# Patient Record
Sex: Female | Born: 1950 | Race: White | Hispanic: No | Marital: Married | Smoking: Former smoker
Health system: Southern US, Community
[De-identification: ages and names within clinical notes are randomized; demographics above are authoritative.]

## PROBLEM LIST (undated history)

## (undated) DIAGNOSIS — M1990 Unspecified osteoarthritis, unspecified site: Secondary | ICD-10-CM

## (undated) DIAGNOSIS — Z8489 Family history of other specified conditions: Secondary | ICD-10-CM

## (undated) DIAGNOSIS — G4730 Sleep apnea, unspecified: Secondary | ICD-10-CM

## (undated) DIAGNOSIS — H409 Unspecified glaucoma: Secondary | ICD-10-CM

## (undated) DIAGNOSIS — M797 Fibromyalgia: Secondary | ICD-10-CM

## (undated) DIAGNOSIS — E210 Primary hyperparathyroidism: Secondary | ICD-10-CM

## (undated) DIAGNOSIS — D649 Anemia, unspecified: Secondary | ICD-10-CM

## (undated) DIAGNOSIS — M419 Scoliosis, unspecified: Secondary | ICD-10-CM

## (undated) DIAGNOSIS — F419 Anxiety disorder, unspecified: Secondary | ICD-10-CM

## (undated) DIAGNOSIS — G250 Essential tremor: Secondary | ICD-10-CM

## (undated) DIAGNOSIS — E669 Obesity, unspecified: Secondary | ICD-10-CM

## (undated) DIAGNOSIS — K219 Gastro-esophageal reflux disease without esophagitis: Secondary | ICD-10-CM

## (undated) DIAGNOSIS — K589 Irritable bowel syndrome without diarrhea: Secondary | ICD-10-CM

## (undated) DIAGNOSIS — F329 Major depressive disorder, single episode, unspecified: Secondary | ICD-10-CM

## (undated) DIAGNOSIS — I10 Essential (primary) hypertension: Secondary | ICD-10-CM

## (undated) DIAGNOSIS — G9332 Myalgic encephalomyelitis/chronic fatigue syndrome: Secondary | ICD-10-CM

## (undated) DIAGNOSIS — G90A Postural orthostatic tachycardia syndrome (POTS): Secondary | ICD-10-CM

## (undated) DIAGNOSIS — F32A Depression, unspecified: Secondary | ICD-10-CM

## (undated) DIAGNOSIS — E785 Hyperlipidemia, unspecified: Secondary | ICD-10-CM

## (undated) HISTORY — DX: Obesity, unspecified: E66.9

## (undated) HISTORY — DX: Gastro-esophageal reflux disease without esophagitis: K21.9

## (undated) HISTORY — PX: DILATION AND CURETTAGE OF UTERUS: SHX78

## (undated) HISTORY — DX: Anemia, unspecified: D64.9

## (undated) HISTORY — DX: Fibromyalgia: M79.7

## (undated) HISTORY — DX: Irritable bowel syndrome, unspecified: K58.9

## (undated) HISTORY — DX: Sleep apnea, unspecified: G47.30

## (undated) HISTORY — DX: Hyperlipidemia, unspecified: E78.5

## (undated) HISTORY — DX: Essential tremor: G25.0

## (undated) HISTORY — DX: Anxiety disorder, unspecified: F41.9

## (undated) HISTORY — PX: APPENDECTOMY: SHX54

## (undated) HISTORY — DX: Myalgic encephalomyelitis/chronic fatigue syndrome: G93.32

## (undated) HISTORY — DX: Major depressive disorder, single episode, unspecified: F32.9

## (undated) HISTORY — DX: Scoliosis, unspecified: M41.9

## (undated) HISTORY — DX: Essential (primary) hypertension: I10

## (undated) HISTORY — DX: Depression, unspecified: F32.A

---
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
## 2010-12-17 ENCOUNTER — Emergency Department (HOSPITAL_COMMUNITY)
Admission: EM | Admit: 2010-12-17 | Discharge: 2010-12-18 | Disposition: A | Discharge status: Home / Self Care | Payer: No Typology Code available for payment source | Attending: Emergency Medicine | Admitting: Emergency Medicine

## 2010-12-17 DIAGNOSIS — H3320 Serous retinal detachment, unspecified eye: Secondary | ICD-10-CM | POA: Insufficient documentation

## 2010-12-17 DIAGNOSIS — Z79899 Other long term (current) drug therapy: Secondary | ICD-10-CM | POA: Insufficient documentation

## 2010-12-17 DIAGNOSIS — H539 Unspecified visual disturbance: Secondary | ICD-10-CM | POA: Insufficient documentation

## 2011-05-11 ENCOUNTER — Encounter: Payer: Self-pay | Admitting: Family Medicine

## 2011-05-11 DIAGNOSIS — I10 Essential (primary) hypertension: Secondary | ICD-10-CM | POA: Insufficient documentation

## 2013-02-15 ENCOUNTER — Ambulatory Visit (INDEPENDENT_AMBULATORY_CARE_PROVIDER_SITE_OTHER): Payer: No Typology Code available for payment source | Admitting: Family Medicine

## 2013-02-15 ENCOUNTER — Encounter: Payer: Self-pay | Admitting: Family Medicine

## 2013-02-15 VITALS — BP 124/78 | HR 70 | Temp 98.1°F | Resp 16 | Wt 249.0 lb

## 2013-02-15 DIAGNOSIS — I10 Essential (primary) hypertension: Secondary | ICD-10-CM

## 2013-02-15 DIAGNOSIS — R002 Palpitations: Secondary | ICD-10-CM

## 2013-02-15 DIAGNOSIS — F329 Major depressive disorder, single episode, unspecified: Secondary | ICD-10-CM

## 2013-02-15 MED ORDER — VENLAFAXINE HCL ER 75 MG PO CP24: 150.0000 mg | ORAL_CAPSULE | Freq: Every day | ORAL | Status: DC

## 2013-02-15 NOTE — Progress Notes (Signed)
  Subjective:    Patient ID: Melanie Hurst, female    DOB: 03/18/1951, 62 y.o.   MRN: [id number]  HPI  Patient history of depression and hypertension. She's currently on Wellbutrin SR 150 mg by mouth daily. She's not taking this twice a day. Furthermore she is taking Zoloft 50 mg by mouth daily. She reports worsening depression. She reports anhedonia. She reports fear of leaving the house and other symptoms of phobia. She denied suicidal ideation. She reports some insomnia. She reports decreased energy. She reports general malaise and apathy.  She also reports occasional fluttering palpitations in her chest. These will occur and last sometimes up to an hour. She denies any syncope Past Medical History  Diagnosis Date  . Hypertension   . Depression    Current Outpatient Prescriptions on File Prior to Visit  Medication Sig Dispense Refill  . buPROPion (WELLBUTRIN SR) 150 MG 12 hr tablet Take 150 mg by mouth 2 (two) times daily.        . sertraline (ZOLOFT) 50 MG tablet Take 50 mg by mouth daily.         No current facility-administered medications on file prior to visit.   No Known Allergies   Review of Systems Remainder of review of systems negative    Objective:   Physical Exam  Neck: Neck supple. No JVD present. No thyromegaly present.  Cardiovascular: Normal rate, regular rhythm and normal heart sounds.   Pulmonary/Chest: Effort normal and breath sounds normal. No respiratory distress. She has no wheezes. She has no rales.  Lymphadenopathy:    She has no cervical adenopathy.          Assessment & Plan:  1. Hypertension Stable. No medication needed at this time.  2. Depression The Zoloft and replaced with Effexor. Begin Effexor XR 75 mg by mouth daily. In 2 weeks increase to 50 mg by mouth daily. Recheck in one month. - venlafaxine XR (EFFEXOR XR) 75 MG 24 hr capsule; Take 2 capsules (150 mg total) by mouth daily.  Dispense: 60 capsule; Refill: 5  3.  Palpitations Schedule an event monitor and get TSH if persistent. - Cardiac event monitor; Future

## 2013-02-27 ENCOUNTER — Encounter: Payer: Self-pay | Admitting: *Deleted

## 2013-02-27 ENCOUNTER — Encounter (INDEPENDENT_AMBULATORY_CARE_PROVIDER_SITE_OTHER): Payer: No Typology Code available for payment source

## 2013-02-27 DIAGNOSIS — R002 Palpitations: Secondary | ICD-10-CM

## 2013-02-27 NOTE — Progress Notes (Signed)
Patient ID: Melanie Hurst, female   DOB: 1950-10-14, 62 y.o.   MRN: [id number] E-cardio 30 day monitor placed on patient.

## 2013-03-15 ENCOUNTER — Encounter: Payer: Self-pay | Admitting: Cardiovascular Disease

## 2013-03-15 ENCOUNTER — Ambulatory Visit (INDEPENDENT_AMBULATORY_CARE_PROVIDER_SITE_OTHER): Payer: No Typology Code available for payment source | Admitting: Cardiovascular Disease

## 2013-03-15 VITALS — BP 138/98 | HR 67 | Ht 70.5 in | Wt 247.0 lb

## 2013-03-15 DIAGNOSIS — I10 Essential (primary) hypertension: Secondary | ICD-10-CM

## 2013-03-15 DIAGNOSIS — F329 Major depressive disorder, single episode, unspecified: Secondary | ICD-10-CM

## 2013-03-15 DIAGNOSIS — R0609 Other forms of dyspnea: Secondary | ICD-10-CM

## 2013-03-15 DIAGNOSIS — F32A Depression, unspecified: Secondary | ICD-10-CM

## 2013-03-15 DIAGNOSIS — R0989 Other specified symptoms and signs involving the circulatory and respiratory systems: Secondary | ICD-10-CM

## 2013-03-15 DIAGNOSIS — R0600 Dyspnea, unspecified: Secondary | ICD-10-CM

## 2013-03-15 NOTE — Progress Notes (Signed)
     Melanie Hurst Date of Birth  April 27, 1951       [facility]    [place] [address], [address]  [address], [address] [place], [place]  [address]   [place], [place]  [address] [phone]     [phone]   Fax  [phone]    Fax [phone]  Problem List: 1. Weakness 2. palpitations  History of Present Illness:  Melanie Hurst is a 62 yo who presents today as a self referral for exertional dyspnea and fatigue.  She denies any chest pain.   She does have palpitations c/w PVCs.    She does not get any regular exercise.  She states that she really does not want to get out and exercise.  She knows  that some of this is due to depression.   She does not work.  She makes no effort to avoid salt.    Current Outpatient Prescriptions on File Prior to Visit  Medication Sig Dispense Refill  . buPROPion (WELLBUTRIN SR) 150 MG 12 hr tablet Take 150 mg by mouth 2 (two) times daily.        . sertraline (ZOLOFT) 50 MG tablet Take 50 mg by mouth daily.        Marland Kitchen venlafaxine XR (EFFEXOR XR) 75 MG 24 hr capsule Take 2 capsules (150 mg total) by mouth daily.  60 capsule  5   No current facility-administered medications on file prior to visit.    Allergies  Allergen Reactions  . Codeine     Vomit     Past Medical History  Diagnosis Date  . Hypertension   . Depression   . IBS (irritable bowel syndrome)   . Anemia   . Sleep apnea   . Scoliosis     Past Surgical History  Procedure Laterality Date  . Tonsilectomy, adenoidectomy, bilateral myringotomy and tubes  1962    History  Smoking status  . Former Smoker  Smokeless tobacco  . Not on file    History  Alcohol Use No    History reviewed. No pertinent family history.  Reviw of Systems:  Reviewed in the HPI.  All other systems are negative.  Physical Exam: Blood pressure 138/98, pulse 67, height 5' 10.5" (1.791 m), weight 247 lb (112.038 kg). General: Well developed, well nourished, in no acute  distress.  Head: Normocephalic, atraumatic, sclera non-icteric, mucus membranes are moist,   Neck: Supple. Carotids are 2 + without bruits. No JVD   Lungs: Clear   Heart: RR, normal S1, S2  Abdomen: Soft, non-tender, non-distended with normal bowel sounds.  Msk:  Strength and tone are normal   Extremities: No clubbing or cyanosis. No edema.  Distal pedal pulses are 2+ and equal    Neuro: CN II - XII intact.  Alert and oriented X 3.   Psych:  Normal   ECG:  O2 sats are 97-98 at rest and with ambulation.  Assessment / Plan:

## 2013-03-15 NOTE — Progress Notes (Signed)
Resting spo2 98% and was 97% during ambulation .

## 2013-03-15 NOTE — Patient Instructions (Addendum)
   Your physician recommends that you schedule a follow-up appointment in: as needed basis/ you may call me back/ Jodette RN and I can place echo order to see heart structure and valves if shortness of breath dont get better with exercise  Your physician discussed the importance of regular exercise and recommended that you start or continue a regular exercise program for good health. Exercise to Stay Healthy Exercise helps you become and stay healthy. EXERCISE IDEAS AND TIPS Choose exercises that:  You enjoy.  Fit into your day. You do not need to exercise really hard to be healthy. You can do exercises at a slow or medium level and stay healthy. You can:  Stretch before and after working out.  Try yoga, Pilates, or tai chi.  Lift weights.  Walk fast, swim, jog, run, climb stairs, bicycle, dance, or rollerskate.  Take aerobic classes. Exercises that burn about 150 calories:  Running 1  miles in 15 minutes.  Playing volleyball for 45 to 60 minutes.  Washing and waxing a car for 45 to 60 minutes.  Playing touch football for 45 minutes.  Walking 1  miles in 35 minutes.  Pushing a stroller 1  miles in 30 minutes.  Playing basketball for 30 minutes.  Raking leaves for 30 minutes.  Bicycling 5 miles in 30 minutes.  Walking 2 miles in 30 minutes.  Dancing for 30 minutes.  Shoveling snow for 15 minutes.  Swimming laps for 20 minutes.  Walking up stairs for 15 minutes.  Bicycling 4 miles in 15 minutes.  Gardening for 30 to 45 minutes.  Jumping rope for 15 minutes.  Washing windows or floors for 45 to 60 minutes. Document Released: 10/30/2010 Document Revised: 12/20/2011 Document Reviewed: 10/30/2010 [facility] Patient Information 2014 [place], [place].

## 2013-03-19 ENCOUNTER — Telehealth: Payer: Self-pay | Admitting: *Deleted

## 2013-03-19 DIAGNOSIS — R0600 Dyspnea, unspecified: Secondary | ICD-10-CM | POA: Insufficient documentation

## 2013-03-19 MED ORDER — PROPRANOLOL HCL 10 MG PO TABS: 10.0000 mg | ORAL_TABLET | Freq: Four times a day (QID) | ORAL | Status: DC | PRN

## 2013-03-19 NOTE — Assessment & Plan Note (Signed)
Melanie Hurst presents with episodes of dyspnea. We ambulated her on the office and did not find any decrease in her oxygen saturation. At this point I suspect a lot of this is due to deconditioning. We'll have her start a regular exercise program. I'll see her back as needed.

## 2013-03-19 NOTE — Telephone Encounter (Signed)
Ecardio/ svt, 171bpm Explained valsalva maneuver, hard cough to try to break rhythm  Propranolol was explained/ order sent Return ov made, Pt states she "lives" on caffeine, she was asked to reduce. Pt agreed to plan and verbalized understanding

## 2013-04-04 ENCOUNTER — Telehealth: Payer: Self-pay | Admitting: *Deleted

## 2013-04-04 NOTE — Telephone Encounter (Signed)
Unable to reach/ number disconnected. Pt has f/u app, will take ecardio results to be scanned, one episode of svt @ 171 bpm 03/19/13 the rest was sinus tach.

## 2013-04-24 ENCOUNTER — Ambulatory Visit (INDEPENDENT_AMBULATORY_CARE_PROVIDER_SITE_OTHER): Payer: No Typology Code available for payment source | Admitting: Family Medicine

## 2013-04-24 ENCOUNTER — Encounter: Payer: Self-pay | Admitting: Family Medicine

## 2013-04-24 VITALS — BP 120/84 | HR 78 | Temp 98.4°F | Resp 18 | Wt 241.0 lb

## 2013-04-24 DIAGNOSIS — L819 Disorder of pigmentation, unspecified: Secondary | ICD-10-CM

## 2013-04-24 DIAGNOSIS — L810 Postinflammatory hyperpigmentation: Secondary | ICD-10-CM

## 2013-04-24 NOTE — Progress Notes (Signed)
  Subjective:    Patient ID: Melanie Hurst, female    DOB: 16-Jan-1951, 62 y.o.   MRN: [id number]  HPI Patient was recently out of the country on vacation. She was walking in sandals. She developed blisters on the dorsum of her right foot over the first MTP joint and over the dorsum of the midfoot. The blisters have now healed but they have left the purplish discoloration in those areas.  There is a not withdrawn, they're not erythematous, they're not swollen, and there not painful. However the patient was concerned they may be infected and she would like evaluated. Otherwise she is doing well. Past Medical History  Diagnosis Date  . Hypertension   . Depression   . IBS (irritable bowel syndrome)   . Anemia   . Sleep apnea   . Scoliosis    Current Outpatient Prescriptions on File Prior to Visit  Medication Sig Dispense Refill  . propranolol (INDERAL) 10 MG tablet Take 1 tablet (10 mg total) by mouth 4 (four) times daily as needed. For palpitations , take 30 minutes apart up to 4 doses.  30 tablet  5  . venlafaxine XR (EFFEXOR XR) 75 MG 24 hr capsule Take 2 capsules (150 mg total) by mouth daily.  60 capsule  5   No current facility-administered medications on file prior to visit.   Allergies  Allergen Reactions  . Codeine     Vomit    Past Surgical History  Procedure Laterality Date  . Tonsilectomy, adenoidectomy, bilateral myringotomy and tubes  1962   History   Social History  . Marital Status: Single    Spouse Name: N/A    Number of Children: N/A  . Years of Education: N/A   Occupational History  . Not on file.   Social History Main Topics  . Smoking status: Former Games developer  . Smokeless tobacco: Not on file  . Alcohol Use: No  . Drug Use: No  . Sexually Active: Not on file   Other Topics Concern  . Not on file   Social History Narrative  . No narrative on file      Review of Systems  All other systems reviewed and are negative.       Objective:   Physical  Exam  Vitals reviewed. Cardiovascular: Normal rate and regular rhythm.   Pulmonary/Chest: Effort normal and breath sounds normal.  Skin: Skin is warm. Rash noted. No erythema. No pallor.   various purplish discoloration over the dorsum of the midfoot and the dorsum of the right first MTP. There is no active cellulitis or swelling. This appears to be hyperpigmentation due to recent inflammation.        Assessment & Plan:  1. Post-inflammatory hyperpigmentation There is no evidence of cellulitis.  I reassured the patient that everything looks like it is healing well. I recommended that she wear good fitting shoes. She should give tincture of time and the discoloration should gradually improve.  Return immediately if worsening or if no better in 2 weeks.

## 2013-05-21 ENCOUNTER — Ambulatory Visit: Payer: No Typology Code available for payment source | Admitting: Cardiovascular Disease

## 2013-06-04 NOTE — Telephone Encounter (Signed)
Pt cancelled app/ left msg to call to reschedule to discuss ecardio results. Number provided

## 2013-08-16 ENCOUNTER — Other Ambulatory Visit: Payer: Self-pay

## 2013-09-26 ENCOUNTER — Other Ambulatory Visit: Payer: Self-pay | Admitting: Family Medicine

## 2014-03-19 ENCOUNTER — Other Ambulatory Visit: Payer: Self-pay | Admitting: Family Medicine

## 2014-03-20 NOTE — Telephone Encounter (Signed)
Refill appropriate and filled per protocol. 

## 2014-04-18 ENCOUNTER — Encounter: Payer: Self-pay | Admitting: Family Medicine

## 2014-04-18 ENCOUNTER — Ambulatory Visit (INDEPENDENT_AMBULATORY_CARE_PROVIDER_SITE_OTHER): Payer: No Typology Code available for payment source | Admitting: Family Medicine

## 2014-04-18 VITALS — BP 126/84 | HR 82 | Temp 98.2°F | Resp 16 | Ht 70.25 in | Wt 249.0 lb

## 2014-04-18 DIAGNOSIS — G252 Other specified forms of tremor: Secondary | ICD-10-CM

## 2014-04-18 DIAGNOSIS — G250 Essential tremor: Secondary | ICD-10-CM

## 2014-04-18 DIAGNOSIS — F329 Major depressive disorder, single episode, unspecified: Secondary | ICD-10-CM

## 2014-04-18 DIAGNOSIS — Z13220 Encounter for screening for lipoid disorders: Secondary | ICD-10-CM

## 2014-04-18 DIAGNOSIS — F3289 Other specified depressive episodes: Secondary | ICD-10-CM

## 2014-04-18 DIAGNOSIS — F32A Depression, unspecified: Secondary | ICD-10-CM

## 2014-04-18 MED ORDER — PROPRANOLOL HCL ER 60 MG PO CP24: 60.0000 mg | ORAL_CAPSULE | Freq: Every day | ORAL | Status: DC

## 2014-04-18 NOTE — Progress Notes (Signed)
   Subjective:    Patient ID: Melanie Hurst, female    DOB: 1951/01/08, 63 y.o.   MRN: [id number]  HPI Patient has a history of chronic fatigue and depression. She is currently on Effexor XR 150 mg poqday.  Depression is somewhat controlled she denies any mood swings or crying spells although she does report anhedonia.  However she has no energy.  She has no drive. She has poor concentration. She feels unmotivated. She is also dealing with an essential tremor. There is a family history of a essential tremor.  This primarily affects her left hand and her right foot. It makes it difficult to perform certain activities at times.  She refuses a Pap smear, colonoscopy, and mammogram. She is interested in the shingles vaccine which we talked about at length. Past Medical History  Diagnosis Date  . Hypertension   . Depression   . IBS (irritable bowel syndrome)   . Anemia   . Sleep apnea   . Scoliosis    Current Outpatient Prescriptions on File Prior to Visit  Medication Sig Dispense Refill  . venlafaxine XR (EFFEXOR-XR) 75 MG 24 hr capsule TAKE 2 CAPSULES (150 MG TOTAL) BY MOUTH DAILY.  60 capsule  0   No current facility-administered medications on file prior to visit.   Allergies  Allergen Reactions  . Codeine     Vomit    History   Social History  . Marital Status: Single    Spouse Name: N/A    Number of Children: N/A  . Years of Education: N/A   Occupational History  . Not on file.   Social History Main Topics  . Smoking status: Former Research scientist (life sciences)  . Smokeless tobacco: Not on file  . Alcohol Use: No  . Drug Use: No  . Sexual Activity: Not on file   Other Topics Concern  . Not on file   Social History Narrative  . No narrative on file      Review of Systems  All other systems reviewed and are negative.      Objective:   Physical Exam  Vitals reviewed. Cardiovascular: Normal rate, regular rhythm, normal heart sounds and intact distal pulses.  Exam reveals no gallop  and no friction rub.   No murmur heard. Pulmonary/Chest: Effort normal and breath sounds normal. No respiratory distress. She has no wheezes. She has no rales.  Abdominal: Soft. Bowel sounds are normal. She exhibits no distension. There is no tenderness. There is no rebound.  Musculoskeletal: She exhibits no edema.          Assessment & Plan:  1. Familial tremor I will try the patient on Inderal LA 60 mg by mouth daily. Recheck in one month. Monitor for hypotension. - propranolol ER (INDERAL LA) 60 MG 24 hr capsule; Take 1 capsule (60 mg total) by mouth daily.  Dispense: 30 capsule; Refill: 5 - CBC with Differential - COMPLETE METABOLIC PANEL WITH GFR  2. Screening cholesterol level I will check a fasting lipid panel to screen the patient for hyperlipidemia. - Lipid panel  3. Depression Decrease Effexor XR to 75 mg by mouth daily for one week and then discontinue the medication altogether. Meanwhile begin brillinta 5 mg poqday for 1 week and then increase to 10 mg poqday.  Recheck in one month. I believe the patient's fatigue and apathy  is likely due to her depression.

## 2014-04-19 ENCOUNTER — Encounter: Payer: Self-pay | Admitting: Family Medicine

## 2014-04-19 LAB — CBC WITH DIFFERENTIAL/PLATELET
BASOS ABS: 0.1 10*3/uL (ref 0.0–0.1)
Basophils Relative: 1 % (ref 0–1)
Eosinophils Absolute: 0.2 10*3/uL (ref 0.0–0.7)
Eosinophils Relative: 3 % (ref 0–5)
HEMATOCRIT: 43.8 % (ref 36.0–46.0)
Hemoglobin: 15.1 g/dL — ABNORMAL HIGH (ref 12.0–15.0)
LYMPHS PCT: 33 % (ref 12–46)
Lymphs Abs: 1.8 10*3/uL (ref 0.7–4.0)
MCH: 28.8 pg (ref 26.0–34.0)
MCHC: 34.5 g/dL (ref 30.0–36.0)
MCV: 83.6 fL (ref 78.0–100.0)
Monocytes Absolute: 0.3 10*3/uL (ref 0.1–1.0)
Monocytes Relative: 6 % (ref 3–12)
NEUTROS ABS: 3.2 10*3/uL (ref 1.7–7.7)
Neutrophils Relative %: 57 % (ref 43–77)
Platelets: 343 10*3/uL (ref 150–400)
RBC: 5.24 MIL/uL — AB (ref 3.87–5.11)
RDW: 14.2 % (ref 11.5–15.5)
WBC: 5.6 10*3/uL (ref 4.0–10.5)

## 2014-04-19 LAB — COMPLETE METABOLIC PANEL WITH GFR
ALT: 14 U/L (ref 0–35)
AST: 13 U/L (ref 0–37)
Albumin: 4.4 g/dL (ref 3.5–5.2)
Alkaline Phosphatase: 98 U/L (ref 39–117)
BUN: 13 mg/dL (ref 6–23)
CO2: 23 mEq/L (ref 19–32)
Calcium: 9.4 mg/dL (ref 8.4–10.5)
Chloride: 104 mEq/L (ref 96–112)
Creat: 0.88 mg/dL (ref 0.50–1.10)
GFR, Est African American: 81 mL/min
GFR, Est Non African American: 71 mL/min
Glucose, Bld: 93 mg/dL (ref 70–99)
POTASSIUM: 4.3 meq/L (ref 3.5–5.3)
Sodium: 138 mEq/L (ref 135–145)
Total Bilirubin: 0.7 mg/dL (ref 0.2–1.2)
Total Protein: 6.7 g/dL (ref 6.0–8.3)

## 2014-04-19 LAB — LIPID PANEL
Cholesterol: 227 mg/dL — ABNORMAL HIGH (ref 0–200)
HDL: 44 mg/dL (ref >39)
LDL Cholesterol: 154 mg/dL — ABNORMAL HIGH (ref 0–99)
Total CHOL/HDL Ratio: 5.2 Ratio
Triglycerides: 143 mg/dL (ref <150)
VLDL: 29 mg/dL (ref 0–40)

## 2014-04-30 ENCOUNTER — Telehealth: Payer: Self-pay | Admitting: Family Medicine

## 2014-04-30 MED ORDER — VENLAFAXINE HCL ER 75 MG PO CP24: ORAL_CAPSULE | ORAL | Status: DC

## 2014-04-30 NOTE — Telephone Encounter (Signed)
Pt called stating that she feels really bad on the Brintellix and wants to go back on the Effexor. She has only been on the 10mg  less then 1 week and I did encourage her to give it 2 weeks to get in her system good but she insisted that it was not helping her and she just wanted to go back on Effexor. What would you like for her to do? Can she just stop the Brintellix?

## 2014-04-30 NOTE — Telephone Encounter (Signed)
Med sent to pharm and pt aware per vm

## 2014-04-30 NOTE — Telephone Encounter (Signed)
Ok, just stop and resume effexor.

## 2014-10-02 ENCOUNTER — Other Ambulatory Visit: Payer: Self-pay | Admitting: Family Medicine

## 2014-10-07 ENCOUNTER — Encounter: Payer: Self-pay | Admitting: Family Medicine

## 2014-10-07 NOTE — Telephone Encounter (Signed)
Medication refill for one time only.  Patient needs to be seen.  Letter sent for patient to call and schedule 

## 2014-12-06 ENCOUNTER — Ambulatory Visit (INDEPENDENT_AMBULATORY_CARE_PROVIDER_SITE_OTHER): Payer: 59 | Admitting: Family Medicine

## 2014-12-06 ENCOUNTER — Encounter: Payer: Self-pay | Admitting: Family Medicine

## 2014-12-06 VITALS — BP 132/86 | HR 98 | Temp 98.3°F | Resp 18 | Ht 70.0 in | Wt 250.0 lb

## 2014-12-06 DIAGNOSIS — J019 Acute sinusitis, unspecified: Secondary | ICD-10-CM

## 2014-12-06 DIAGNOSIS — F411 Generalized anxiety disorder: Secondary | ICD-10-CM

## 2014-12-06 MED ORDER — CEFDINIR 300 MG PO CAPS: 300.0000 mg | ORAL_CAPSULE | Freq: Two times a day (BID) | ORAL | Status: DC

## 2014-12-06 MED ORDER — VENLAFAXINE HCL ER 75 MG PO CP24: ORAL_CAPSULE | ORAL | Status: DC

## 2014-12-06 MED ORDER — ALPRAZOLAM 0.5 MG PO TABS: 0.5000 mg | ORAL_TABLET | Freq: Three times a day (TID) | ORAL | Status: DC | PRN

## 2014-12-06 NOTE — Progress Notes (Signed)
   Subjective:    Patient ID: Melanie Hurst, female    DOB: 1951-07-10, 64 y.o.   MRN: [id number]  HPI Patient has a history of depression and generalized anxiety disorder. She also has social phobia that keeps her from wanting to leave the home. Fortunately Effexor 75 mg extended release, 2 tablets in the morning seems to be controlling it fairly well this year. Her depression is much better controlled. She is starting to venture away from home more often. She would like a medication she could take sparingly on an as-needed basis to help calm some of her social phobia. She also has a sinus infection. She also has a left ear infection. On examination today her left tympanic membrane is erythematous dull and retracted. She reports green purulent nasal discharge. Past Medical History  Diagnosis Date  . Hypertension   . Depression   . IBS (irritable bowel syndrome)   . Anemia   . Sleep apnea   . Scoliosis    Past Surgical History  Procedure Laterality Date  . Tonsilectomy, adenoidectomy, bilateral myringotomy and tubes  1962   Current Outpatient Prescriptions on File Prior to Visit  Medication Sig Dispense Refill  . propranolol ER (INDERAL LA) 60 MG 24 hr capsule Take 1 capsule (60 mg total) by mouth daily. 30 capsule 5   No current facility-administered medications on file prior to visit.   Allergies  Allergen Reactions  . Codeine     Vomit    History   Social History  . Marital Status: Single    Spouse Name: N/A  . Number of Children: N/A  . Years of Education: N/A   Occupational History  . Not on file.   Social History Main Topics  . Smoking status: Former Research scientist (life sciences)  . Smokeless tobacco: Not on file  . Alcohol Use: No  . Drug Use: No  . Sexual Activity: Not on file   Other Topics Concern  . Not on file   Social History Narrative      Review of Systems  All other systems reviewed and are negative.      Objective:   Physical Exam  HENT:  Right Ear: External  ear normal.  Left Ear: External ear normal. Tympanic membrane is injected and erythematous.  Mouth/Throat: Oropharynx is clear and moist.  Eyes: Conjunctivae are normal.  Cardiovascular: Normal rate, regular rhythm and normal heart sounds.   Pulmonary/Chest: Effort normal and breath sounds normal.  Abdominal: Soft. Bowel sounds are normal.  Vitals reviewed.         Assessment & Plan:  Acute rhinosinusitis - Plan: cefdinir (OMNICEF) 300 MG capsule  GAD (generalized anxiety disorder) - Plan: ALPRAZolam (XANAX) 0.5 MG tablet  Begin Omnicef 300 mg by mouth twice a day for 10 days for a sinus infection and ear infection. Continue Effexor XR 150 mg by mouth every morning for generalized anxiety disorder as well as depression. The patient can augment with Xanax 0.5 mg 1 by mouth every 8 hours when necessary anxiety. I would like these 30 tablets to last almost a year. I would like the patient to use them sparingly as needed to help treat her social phobia

## 2015-06-10 ENCOUNTER — Encounter: Payer: Self-pay | Admitting: Family Medicine

## 2015-06-10 ENCOUNTER — Ambulatory Visit (INDEPENDENT_AMBULATORY_CARE_PROVIDER_SITE_OTHER): Payer: 59 | Admitting: Family Medicine

## 2015-06-10 VITALS — BP 136/90 | HR 80 | Temp 98.8°F | Resp 16 | Ht 70.25 in | Wt 254.0 lb

## 2015-06-10 DIAGNOSIS — F329 Major depressive disorder, single episode, unspecified: Secondary | ICD-10-CM | POA: Diagnosis not present

## 2015-06-10 DIAGNOSIS — E785 Hyperlipidemia, unspecified: Secondary | ICD-10-CM

## 2015-06-10 DIAGNOSIS — F32A Depression, unspecified: Secondary | ICD-10-CM

## 2015-06-10 LAB — CBC WITH DIFFERENTIAL/PLATELET
BASOS PCT: 1 % (ref 0–1)
Basophils Absolute: 0.1 10*3/uL (ref 0.0–0.1)
Eosinophils Absolute: 0.1 10*3/uL (ref 0.0–0.7)
Eosinophils Relative: 2 % (ref 0–5)
HEMATOCRIT: 45.4 % (ref 36.0–46.0)
HEMOGLOBIN: 15.6 g/dL — AB (ref 12.0–15.0)
LYMPHS ABS: 2.3 10*3/uL (ref 0.7–4.0)
LYMPHS PCT: 35 % (ref 12–46)
MCH: 29.5 pg (ref 26.0–34.0)
MCHC: 34.4 g/dL (ref 30.0–36.0)
MCV: 86 fL (ref 78.0–100.0)
MONO ABS: 0.5 10*3/uL (ref 0.1–1.0)
MONOS PCT: 7 % (ref 3–12)
MPV: 9.1 fL (ref 8.6–12.4)
NEUTROS ABS: 3.7 10*3/uL (ref 1.7–7.7)
NEUTROS PCT: 55 % (ref 43–77)
Platelets: 305 10*3/uL (ref 150–400)
RBC: 5.28 MIL/uL — ABNORMAL HIGH (ref 3.87–5.11)
RDW: 14.3 % (ref 11.5–15.5)
WBC: 6.7 10*3/uL (ref 4.0–10.5)

## 2015-06-10 LAB — COMPLETE METABOLIC PANEL WITH GFR
ALT: 9 U/L (ref 6–29)
AST: 13 U/L (ref 10–35)
Albumin: 4.3 g/dL (ref 3.6–5.1)
Alkaline Phosphatase: 108 U/L (ref 33–130)
BUN: 8 mg/dL (ref 7–25)
CHLORIDE: 104 mmol/L (ref 98–110)
CO2: 24 mmol/L (ref 20–31)
CREATININE: 0.88 mg/dL (ref 0.50–0.99)
Calcium: 9.2 mg/dL (ref 8.6–10.4)
GFR, Est African American: 81 mL/min (ref >=60)
GFR, Est Non African American: 70 mL/min (ref >=60)
GLUCOSE: 87 mg/dL (ref 70–99)
Potassium: 4.4 mmol/L (ref 3.5–5.3)
SODIUM: 138 mmol/L (ref 135–146)
Total Bilirubin: 1 mg/dL (ref 0.2–1.2)
Total Protein: 6.6 g/dL (ref 6.1–8.1)

## 2015-06-10 LAB — LIPID PANEL
CHOL/HDL RATIO: 4.6 ratio (ref <=5.0)
CHOLESTEROL: 227 mg/dL — AB (ref 125–200)
HDL: 49 mg/dL (ref >=46)
LDL CALC: 150 mg/dL — AB (ref <130)
Triglycerides: 142 mg/dL (ref <150)
VLDL: 28 mg/dL (ref <30)

## 2015-06-10 MED ORDER — VENLAFAXINE HCL ER 75 MG PO CP24: ORAL_CAPSULE | ORAL | Status: DC

## 2015-06-10 NOTE — Progress Notes (Signed)
Subjective:    Patient ID: Melanie Hurst, female    DOB: [birth year], 64 y.o.   MRN: [id number]  HPI 04/2014 Patient has a history of chronic fatigue and depression. She is currently on Effexor XR 150 mg poqday.  Depression is somewhat controlled she denies any mood swings or crying spells although she does report anhedonia.  However she has no energy.  She has no drive. She has poor concentration. She feels unmotivated. She is also dealing with an essential tremor. There is a family history of a essential tremor.  This primarily affects her left hand and her right foot. It makes it difficult to perform certain activities at times.  She refuses a Pap smear, colonoscopy, and mammogram. She is interested in the shingles vaccine which we talked about at length.  At that time, my plan was: 1. Familial tremor I will try the patient on Inderal LA 60 mg by mouth daily. Recheck in one month. Monitor for hypotension. - propranolol ER (INDERAL LA) 60 MG 24 hr capsule; Take 1 capsule (60 mg total) by mouth daily.  Dispense: 30 capsule; Refill: 5 - CBC with Differential - COMPLETE METABOLIC PANEL WITH GFR  2. Screening cholesterol level I will check a fasting lipid panel to screen the patient for hyperlipidemia. - Lipid panel  3. Depression Decrease Effexor XR to 75 mg by mouth daily for one week and then discontinue the medication altogether. Meanwhile begin brillinta 5 mg poqday for 1 week and then increase to 10 mg poqday.  Recheck in one month. I believe the patient's fatigue and apathy  is likely due to her depression.   06/10/15  she is here today for follow-up. She would like to continue Effexor XR. She believes that medication helps control her anxiety and depression. She has made her longtime partner Melanie Hurst. Overall she seems to be in much better spirits today. She continues to refuse colonoscopies, mammograms, and Pap smears. She continues to refuse medication for hyperlipidemia.. She is  due today to recheck a fasting lipid panel. She has not been taking the propranolol for tremor. Past Medical History  Diagnosis Date  . Hypertension   . Depression   . IBS (irritable bowel syndrome)   . Anemia   . Sleep apnea   . Scoliosis    Current Outpatient Prescriptions on File Prior to Visit  Medication Sig Dispense Refill  . ALPRAZolam (XANAX) 0.5 MG tablet Take 1 tablet (0.5 mg total) by mouth 3 (three) times daily as needed for anxiety. 30 tablet 0  . propranolol ER (INDERAL LA) 60 MG 24 hr capsule Take 1 capsule (60 mg total) by mouth daily. 30 capsule 5   No current facility-administered medications on file prior to visit.   Allergies  Allergen Reactions  . Codeine     Vomit    Social History   Social History  . Marital Status: Single    Spouse Name: N/A  . Number of Children: N/A  . Years of Education: N/A   Occupational History  . Not on file.   Social History Main Topics  . Smoking status: Former Research scientist (life sciences)  . Smokeless tobacco: Not on file  . Alcohol Use: No  . Drug Use: No  . Sexual Activity: Not on file   Other Topics Concern  . Not on file   Social History Narrative      Review of Systems  All other systems reviewed and are negative.      Objective:  Physical Exam  Cardiovascular: Normal rate, regular rhythm, normal heart sounds and intact distal pulses.  Exam reveals no gallop and no friction rub.   No murmur heard. Pulmonary/Chest: Effort normal and breath sounds normal. No respiratory distress. She has no wheezes. She has no rales.  Abdominal: Soft. Bowel sounds are normal. She exhibits no distension. There is no tenderness. There is no rebound.  Musculoskeletal: She exhibits no edema.  Vitals reviewed.         Assessment & Plan:  Depression - Plan: venlafaxine XR (EFFEXOR-XR) 75 MG 24 hr capsule  HLD (hyperlipidemia) - Plan: COMPLETE METABOLIC PANEL WITH GFR, Lipid panel, CBC with Differential/Platelet   depression seems well  controlled at the present time. I will continue the Effexor XR. The patient certainly satisfied at the current dose. I am happy to hear that. I again recommended Pap smears, mammogram, and a colonoscopy but the patient refused. I recommended a flu shot this fall in October as well as the shingles vaccine. The patient will check on the price of the shingles vaccine first. Given her history of hyperlipidemia, I will check a fasting lipid panel. Blood pressures borderline. I last the patient to monitor this closely at home. Given her hyperlipidemia, if she is not willing to take a statin, I did recommend 2000 mg a day of fish oil.

## 2015-06-12 ENCOUNTER — Encounter: Payer: Self-pay | Admitting: Family Medicine

## 2016-06-05 ENCOUNTER — Other Ambulatory Visit: Payer: Self-pay | Admitting: Family Medicine

## 2016-06-05 DIAGNOSIS — F329 Major depressive disorder, single episode, unspecified: Secondary | ICD-10-CM

## 2016-06-05 DIAGNOSIS — F32A Depression, unspecified: Secondary | ICD-10-CM

## 2016-06-11 ENCOUNTER — Encounter: Payer: Self-pay | Admitting: Family Medicine

## 2016-06-11 ENCOUNTER — Ambulatory Visit (INDEPENDENT_AMBULATORY_CARE_PROVIDER_SITE_OTHER): Payer: Medicare Other | Admitting: Family Medicine

## 2016-06-11 VITALS — BP 132/88 | HR 72 | Temp 98.9°F | Resp 16 | Ht 71.0 in | Wt 272.0 lb

## 2016-06-11 DIAGNOSIS — R5382 Chronic fatigue, unspecified: Secondary | ICD-10-CM

## 2016-06-11 DIAGNOSIS — Z79899 Other long term (current) drug therapy: Secondary | ICD-10-CM | POA: Diagnosis not present

## 2016-06-11 DIAGNOSIS — Z23 Encounter for immunization: Secondary | ICD-10-CM | POA: Diagnosis not present

## 2016-06-11 DIAGNOSIS — F329 Major depressive disorder, single episode, unspecified: Secondary | ICD-10-CM

## 2016-06-11 DIAGNOSIS — F32A Depression, unspecified: Secondary | ICD-10-CM

## 2016-06-11 LAB — CBC WITH DIFFERENTIAL/PLATELET
BASOS PCT: 1 %
Basophils Absolute: 70 cells/uL (ref 0–200)
EOS ABS: 140 {cells}/uL (ref 15–500)
Eosinophils Relative: 2 %
HEMATOCRIT: 45.7 % — AB (ref 35.0–45.0)
Hemoglobin: 15 g/dL (ref 12.0–15.0)
LYMPHS ABS: 1960 {cells}/uL (ref 850–3900)
Lymphocytes Relative: 28 %
MCH: 28.7 pg (ref 27.0–33.0)
MCHC: 32.8 g/dL (ref 32.0–36.0)
MCV: 87.4 fL (ref 80.0–100.0)
MONO ABS: 420 {cells}/uL (ref 200–950)
MPV: 9.5 fL (ref 7.5–12.5)
Monocytes Relative: 6 %
NEUTROS ABS: 4410 {cells}/uL (ref 1500–7800)
Neutrophils Relative %: 63 %
Platelets: 299 10*3/uL (ref 140–400)
RBC: 5.23 MIL/uL — ABNORMAL HIGH (ref 3.80–5.10)
RDW: 14.3 % (ref 11.0–15.0)
WBC: 7 10*3/uL (ref 3.8–10.8)

## 2016-06-11 LAB — COMPLETE METABOLIC PANEL WITH GFR
ALT: 13 U/L (ref 6–29)
AST: 15 U/L (ref 10–35)
Albumin: 4.3 g/dL (ref 3.6–5.1)
Alkaline Phosphatase: 87 U/L (ref 33–130)
BILIRUBIN TOTAL: 0.6 mg/dL (ref 0.2–1.2)
BUN: 14 mg/dL (ref 7–25)
CALCIUM: 9.1 mg/dL (ref 8.6–10.4)
CO2: 26 mmol/L (ref 20–31)
CREATININE: 0.97 mg/dL (ref 0.50–0.99)
Chloride: 105 mmol/L (ref 98–110)
GFR, EST AFRICAN AMERICAN: 71 mL/min (ref >=60)
GFR, Est Non African American: 62 mL/min (ref >=60)
Glucose, Bld: 91 mg/dL (ref 70–99)
Potassium: 5 mmol/L (ref 3.5–5.3)
Sodium: 140 mmol/L (ref 135–146)
TOTAL PROTEIN: 6.5 g/dL (ref 6.1–8.1)

## 2016-06-11 MED ORDER — VENLAFAXINE HCL ER 75 MG PO CP24: ORAL_CAPSULE | ORAL | 4 refills | Status: DC

## 2016-06-11 MED ORDER — PROPRANOLOL HCL 20 MG PO TABS: 20.0000 mg | ORAL_TABLET | Freq: Three times a day (TID) | ORAL | 5 refills | Status: DC

## 2016-06-11 NOTE — Addendum Note (Signed)
Addended by: Sheral Flow on: 06/11/2016 12:45 PM   Modules accepted: Orders

## 2016-06-11 NOTE — Progress Notes (Signed)
Subjective:    Patient ID: Melanie Hurst, female    DOB: 10-Mar-1951, 65 y.o.   MRN: [id number]  HPI7/2015 Patient has a history of chronic fatigue and depression. She is currently on Effexor XR 150 mg poqday.  Depression is somewhat controlled she denies any mood swings or crying spells although she does report anhedonia.  However she has no energy.  She has no drive. She has poor concentration. She feels unmotivated. She is also dealing with an essential tremor. There is a family history of a essential tremor.  This primarily affects her left hand and her right foot. It makes it difficult to perform certain activities at times.  She refuses a Pap smear, colonoscopy, and mammogram. She is interested in the shingles vaccine which we talked about at length.  At that time, my plan was: 1. Familial tremor I will try the patient on Inderal LA 60 mg by mouth daily. Recheck in one month. Monitor for hypotension. - propranolol ER (INDERAL LA) 60 MG 24 hr capsule; Take 1 capsule (60 mg total) by mouth daily.  Dispense: 30 capsule; Refill: 5 - CBC with Differential - COMPLETE METABOLIC PANEL WITH GFR  2. Screening cholesterol level I will check a fasting lipid panel to screen the patient for hyperlipidemia. - Lipid panel  3. Depression Decrease Effexor XR to 75 mg by mouth daily for one week and then discontinue the medication altogether. Meanwhile begin brillinta 5 mg poqday for 1 week and then increase to 10 mg poqday.  Recheck in one month. I believe the patient's fatigue and apathy  is likely due to her depression.   06/10/15  she is here today for follow-up. She would like to continue Effexor XR. She believes that medication helps control her anxiety and depression. She has made her longtime partner Melanie Hurst. Overall she seems to be in much better spirits today. She continues to refuse colonoscopies, mammograms, and Pap smears. She continues to refuse medication for hyperlipidemia.. She is  due today to recheck a fasting lipid panel. She has not been taking the propranolol for tremor.  At that time, my plan was: depression seems well controlled at the present time. I will continue the Effexor XR. The patient certainly satisfied at the current dose. I am happy to hear that. I again recommended Pap smears, mammogram, and a colonoscopy but the patient refused. I recommended a flu shot this fall in October as well as the shingles vaccine. The patient will check on the price of the shingles vaccine first. Given her history of hyperlipidemia, I will check a fasting lipid panel. Blood pressures borderline. I last the patient to monitor this closely at home. Given her hyperlipidemia, if she is not willing to take a statin, I did recommend 2000 mg a day of fish oil.  06/11/16 Patient is here today for follow-up. She is requesting a year supply refills on her Effexor. She denies any problems with the medication. She is requesting to try propranolol again for essential tremor. She has a pronounced resting tremor. However it is worse with activity. She has no other signs or symptoms of Parkinson's disease. She denies any problems with ambulation. She would like to try propranolol immediate release of that she could take it on a more as-needed basis. She continues to decline a mammogram or colonoscopy. We discussed hepatitis C screening and she declined that as well. She will be willing to consent to a flu shot. I recommended that she check on the  price of the shingles vaccine. I recommended that she receive the pneumonia vaccine after she turns 51. We will defer a bone density test until next year Past Medical History:  Diagnosis Date  . Anemia   . Depression   . Hypertension   . IBS (irritable bowel syndrome)   . Scoliosis   . Sleep apnea    Current Outpatient Prescriptions on File Prior to Visit  Medication Sig Dispense Refill  . ALPRAZolam (XANAX) 0.5 MG tablet Take 1 tablet (0.5 mg total) by  mouth 3 (three) times daily as needed for anxiety. (Patient not taking: Reported on 06/11/2016) 30 tablet 0  . propranolol ER (INDERAL LA) 60 MG 24 hr capsule Take 1 capsule (60 mg total) by mouth daily. (Patient not taking: Reported on 06/11/2016) 30 capsule 5   No current facility-administered medications on file prior to visit.    Allergies  Allergen Reactions  . Codeine     Vomit    Social History   Social History  . Marital status: Single    Spouse name: N/A  . Number of children: N/A  . Years of education: N/A   Occupational History  . Not on file.   Social History Main Topics  . Smoking status: Former Research scientist (life sciences)  . Smokeless tobacco: Never Used  . Alcohol use No  . Drug use: No  . Sexual activity: Not Currently   Other Topics Concern  . Not on file   Social History Narrative  . No narrative on file      Review of Systems  All other systems reviewed and are negative.      Objective:   Physical Exam  Cardiovascular: Normal rate, regular rhythm, normal heart sounds and intact distal pulses.  Exam reveals no gallop and no friction rub.   No murmur heard. Pulmonary/Chest: Effort normal and breath sounds normal. No respiratory distress. She has no wheezes. She has no rales.  Abdominal: Soft. Bowel sounds are normal. She exhibits no distension. There is no tenderness. There is no rebound.  Musculoskeletal: She exhibits no edema.  Vitals reviewed.         Assessment & Plan:  Chronic fatigue - Plan: CBC with Differential/Platelet, COMPLETE METABOLIC PANEL WITH GFR, TSH, Vitamin B12  Depression - Plan: venlafaxine XR (EFFEXOR-XR) 75 MG 24 hr capsule Continue Effexor 150 mg by mouth daily. Patient continues to complain of chronic fatigue and therefore I will check a CBC, CMP, TSH, and a vitamin B12 level. If labs are normal, I believe is related to her depression and chronic fatigue syndrome. If so we could consider a stimulant such as methylphenidate off label.  However I'll be very concerned about this exacerbating her PVCs or possibly worsening her tremor. At the present time she's not interested in this. She received her flu shot today. She will check on the price of the shingles vaccine. Defer the pneumonia vaccine until after 65. Defer the bone density test until after 65.

## 2016-06-12 LAB — TSH: TSH: 2.42 m[IU]/L

## 2016-06-12 LAB — VITAMIN B12: VITAMIN B 12: 273 pg/mL (ref 200–1100)

## 2016-06-16 ENCOUNTER — Encounter: Payer: Self-pay | Admitting: Family Medicine

## 2016-08-02 ENCOUNTER — Encounter: Payer: Self-pay | Admitting: Family Medicine

## 2016-08-02 ENCOUNTER — Ambulatory Visit (INDEPENDENT_AMBULATORY_CARE_PROVIDER_SITE_OTHER): Payer: Medicare Other | Admitting: Family Medicine

## 2016-08-02 VITALS — BP 140/100 | HR 86 | Temp 98.6°F | Resp 16 | Ht 70.5 in | Wt 273.0 lb

## 2016-08-02 DIAGNOSIS — R053 Chronic cough: Secondary | ICD-10-CM

## 2016-08-02 DIAGNOSIS — R600 Localized edema: Secondary | ICD-10-CM | POA: Diagnosis not present

## 2016-08-02 DIAGNOSIS — R05 Cough: Secondary | ICD-10-CM

## 2016-08-02 DIAGNOSIS — Z13220 Encounter for screening for lipoid disorders: Secondary | ICD-10-CM

## 2016-08-02 NOTE — Progress Notes (Signed)
Subjective:    Patient ID: Melanie Hurst, female    DOB: Mar 03, 1951, 65 y.o.   MRN: [id number]  HPI Patient is concerned that she may have hemochromatosis. She has no family history of cirrhosis or liver disease. She has never had elevated liver function test. She has no bronze discoloration to the skin. Her blood sugar is normal. Therefore I tried to reassure the patient that I do not believe she has hemochromatosis. However she is concerned because she has noticed bilateral ankle swelling. It tends to occur when her legs are in a dependent position for a prolonged period of time. She does have a family history of varicose veins. Today on exam there is trace bipedal edema. She is concerned it may be a reflection of an underlying heart problem because she is also noticed a chronic cough for the last 6 weeks. The cough is nonproductive. She denies any paroxysmal my internal dyspnea. She denies any orthostatic dyspnea. She denies any chest pain. She does have a history of smoking for possibly 6 pack years. She denies any hemoptysis or shortness of breath. She does complain of chronic fatigue which I have assumed to bend her fibromyalgia. Past Medical History:  Diagnosis Date  . Anemia   . Depression   . Hypertension   . IBS (irritable bowel syndrome)   . Scoliosis   . Sleep apnea    Past Surgical History:  Procedure Laterality Date  . TONSILECTOMY, ADENOIDECTOMY, BILATERAL MYRINGOTOMY AND TUBES  1962   Current Outpatient Prescriptions on File Prior to Visit  Medication Sig Dispense Refill  . propranolol (INDERAL) 20 MG tablet Take 1 tablet (20 mg total) by mouth 3 (three) times daily. 90 tablet 5  . venlafaxine XR (EFFEXOR-XR) 75 MG 24 hr capsule TAKE 2 CAPSULES (150 MG TOTAL) BY MOUTH DAILY. 180 capsule 4   No current facility-administered medications on file prior to visit.    Allergies  Allergen Reactions  . Codeine     Vomit    Social History   Social History  . Marital status:  Single    Spouse name: N/A  . Number of children: N/A  . Years of education: N/A   Occupational History  . Not on file.   Social History Main Topics  . Smoking status: Former Research scientist (life sciences)  . Smokeless tobacco: Never Used  . Alcohol use No  . Drug use: No  . Sexual activity: Not Currently   Other Topics Concern  . Not on file   Social History Narrative  . No narrative on file      Review of Systems  All other systems reviewed and are negative.      Objective:   Physical Exam  Constitutional: She appears well-developed and well-nourished.  Neck: No JVD present.  Cardiovascular: Normal rate, regular rhythm and normal heart sounds.   No murmur heard. Pulmonary/Chest: Effort normal and breath sounds normal. No respiratory distress. She has no wheezes. She has no rales.  Abdominal: Soft. Bowel sounds are normal.  Musculoskeletal: She exhibits edema.  Vitals reviewed.         Assessment & Plan:  Localized edema - Plan: Brain natriuretic peptide, CBC with Differential/Platelet, COMPLETE METABOLIC PANEL WITH GFR, DG Chest 2 View  Chronic coughing - Plan: DG Chest 2 View  Screening cholesterol level - Plan: Lipid panel  I will start by checking her renal function, her liver function tests, and a BNP to evaluate for causes of edema. I believe is due to  chronic venous insufficiency. I tried to reassure the patient that I see no evidence of congestive heart failure. If her BNP is normal, I see no reason to proceed with an echocardiogram. However given her chronic cough, I would obtain a chest x-ray. If her chest x-ray was clear, the most likely explanation would be laryngo-esophageal reflux. I do not believe the patient has hemochromatosis and she has no warning signs and her lab work. I am concerned about her blood pressure. I will await her lab work prior to making any recommendations.

## 2016-08-03 ENCOUNTER — Encounter: Payer: Self-pay | Admitting: Family Medicine

## 2016-08-03 LAB — LIPID PANEL
CHOL/HDL RATIO: 5.8 ratio — AB (ref <=5.0)
Cholesterol: 215 mg/dL — ABNORMAL HIGH (ref 125–200)
HDL: 37 mg/dL — ABNORMAL LOW (ref >=46)
LDL Cholesterol: 130 mg/dL — ABNORMAL HIGH (ref <130)
Triglycerides: 238 mg/dL — ABNORMAL HIGH (ref <150)
VLDL: 48 mg/dL — ABNORMAL HIGH (ref <30)

## 2016-08-03 LAB — COMPLETE METABOLIC PANEL WITH GFR
ALBUMIN: 4.1 g/dL (ref 3.6–5.1)
ALK PHOS: 92 U/L (ref 33–130)
ALT: 14 U/L (ref 6–29)
AST: 16 U/L (ref 10–35)
BUN: 14 mg/dL (ref 7–25)
CO2: 28 mmol/L (ref 20–31)
CREATININE: 0.84 mg/dL (ref 0.50–0.99)
Calcium: 9.4 mg/dL (ref 8.6–10.4)
Chloride: 102 mmol/L (ref 98–110)
GFR, Est African American: 84 mL/min (ref >=60)
GFR, Est Non African American: 73 mL/min (ref >=60)
GLUCOSE: 89 mg/dL (ref 70–99)
POTASSIUM: 4.6 mmol/L (ref 3.5–5.3)
SODIUM: 139 mmol/L (ref 135–146)
TOTAL PROTEIN: 6.5 g/dL (ref 6.1–8.1)
Total Bilirubin: 0.7 mg/dL (ref 0.2–1.2)

## 2016-08-03 LAB — CBC WITH DIFFERENTIAL/PLATELET
BASOS ABS: 83 {cells}/uL (ref 0–200)
Basophils Relative: 1 %
EOS ABS: 166 {cells}/uL (ref 15–500)
Eosinophils Relative: 2 %
HCT: 44.8 % (ref 35.0–45.0)
Hemoglobin: 14.9 g/dL (ref 12.0–15.0)
LYMPHS PCT: 27 %
Lymphs Abs: 2241 cells/uL (ref 850–3900)
MCH: 29.2 pg (ref 27.0–33.0)
MCHC: 33.3 g/dL (ref 32.0–36.0)
MCV: 87.7 fL (ref 80.0–100.0)
MONOS PCT: 6 %
MPV: 9.2 fL (ref 7.5–12.5)
Monocytes Absolute: 498 cells/uL (ref 200–950)
Neutro Abs: 5312 cells/uL (ref 1500–7800)
Neutrophils Relative %: 64 %
PLATELETS: 291 10*3/uL (ref 140–400)
RBC: 5.11 MIL/uL — ABNORMAL HIGH (ref 3.80–5.10)
RDW: 14.1 % (ref 11.0–15.0)
WBC: 8.3 10*3/uL (ref 3.8–10.8)

## 2016-08-03 LAB — BRAIN NATRIURETIC PEPTIDE: BRAIN NATRIURETIC PEPTIDE: 15.5 pg/mL (ref <100)

## 2016-08-04 ENCOUNTER — Encounter: Payer: Self-pay | Admitting: Family Medicine

## 2016-08-04 ENCOUNTER — Other Ambulatory Visit: Payer: Self-pay | Admitting: Family Medicine

## 2016-08-04 MED ORDER — LOSARTAN POTASSIUM 50 MG PO TABS: 50.0000 mg | ORAL_TABLET | Freq: Every day | ORAL | 3 refills | Status: DC

## 2017-04-19 ENCOUNTER — Encounter: Payer: Self-pay | Admitting: Family Medicine

## 2017-04-19 ENCOUNTER — Ambulatory Visit (INDEPENDENT_AMBULATORY_CARE_PROVIDER_SITE_OTHER): Payer: Medicare Other | Admitting: Family Medicine

## 2017-04-19 VITALS — BP 142/90 | HR 110 | Temp 98.7°F | Resp 22 | Ht 70.25 in | Wt 264.0 lb

## 2017-04-19 DIAGNOSIS — R000 Tachycardia, unspecified: Secondary | ICD-10-CM | POA: Diagnosis not present

## 2017-04-19 DIAGNOSIS — R0609 Other forms of dyspnea: Secondary | ICD-10-CM

## 2017-04-19 DIAGNOSIS — R5382 Chronic fatigue, unspecified: Secondary | ICD-10-CM

## 2017-04-19 NOTE — Progress Notes (Signed)
Subjective:    Patient ID: Melanie Hurst, female    DOB: June 17, 1951, 66 y.o.   MRN: [id number]  HPI7/2015 Patient has a history of chronic fatigue and depression. She is currently on Effexor XR 150 mg poqday.  Depression is somewhat controlled she denies any mood swings or crying spells although she does report anhedonia.  However she has no energy.  She has no drive. She has poor concentration. She feels unmotivated. She is also dealing with an essential tremor. There is a family history of a essential tremor.  This primarily affects her left hand and her right foot. It makes it difficult to perform certain activities at times.  She refuses a Pap smear, colonoscopy, and mammogram. She is interested in the shingles vaccine which we talked about at length.  At that time, my plan was: 1. Familial tremor I will try the patient on Inderal LA 60 mg by mouth daily. Recheck in one month. Monitor for hypotension. - propranolol ER (INDERAL LA) 60 MG 24 hr capsule; Take 1 capsule (60 mg total) by mouth daily.  Dispense: 30 capsule; Refill: 5 - CBC with Differential - COMPLETE METABOLIC PANEL WITH GFR  2. Screening cholesterol level I will check a fasting lipid panel to screen the patient for hyperlipidemia. - Lipid panel  3. Depression Decrease Effexor XR to 75 mg by mouth daily for one week and then discontinue the medication altogether. Meanwhile begin brillinta 5 mg poqday for 1 week and then increase to 10 mg poqday.  Recheck in one month. I believe the patient's fatigue and apathy  is likely due to her depression.   06/10/15  she is here today for follow-up. She would like to continue Effexor XR. She believes that medication helps control her anxiety and depression. She has made her longtime partner [name]. Overall she seems to be in much better spirits today. She continues to refuse colonoscopies, mammograms, and Pap smears. She continues to refuse medication for hyperlipidemia.. She is  due today to recheck a fasting lipid panel. She has not been taking the propranolol for tremor.  At that time, my plan was: depression seems well controlled at the present time. I will continue the Effexor XR. The patient certainly satisfied at the current dose. I am happy to hear that. I again recommended Pap smears, mammogram, and a colonoscopy but the patient refused. I recommended a flu shot this fall in October as well as the shingles vaccine. The patient will check on the price of the shingles vaccine first. Given her history of hyperlipidemia, I will check a fasting lipid panel. Blood pressures borderline. I last the patient to monitor this closely at home. Given her hyperlipidemia, if she is not willing to take a statin, I did recommend 2000 mg a day of fish oil.  06/11/16 Patient is here today for follow-up. She is requesting a year supply refills on her Effexor. She denies any problems with the medication. She is requesting to try propranolol again for essential tremor. She has a pronounced resting tremor. However it is worse with activity. She has no other signs or symptoms of Parkinson's disease. She denies any problems with ambulation. She would like to try propranolol immediate release of that she could take it on a more as-needed basis. She continues to decline a mammogram or colonoscopy. We discussed hepatitis C screening and she declined that as well. She will be willing to consent to a flu shot. I recommended that she check on the  price of the shingles vaccine. I recommended that she receive the pneumonia vaccine after she turns 5. We will defer a bone density test until next year.  At that time, my plan was: Continue Effexor 150 mg by mouth daily. Patient continues to complain of chronic fatigue and therefore I will check a CBC, CMP, TSH, and a vitamin B12 level. If labs are normal, I believe is related to her depression and chronic fatigue syndrome. If so we could consider a stimulant such as  methylphenidate off label. However I'll be very concerned about this exacerbating her PVCs or possibly worsening her tremor. At the present time she's not interested in this. She received her flu shot today. She will check on the price of the shingles vaccine. Defer the pneumonia vaccine until after 65. Defer the bone density test until after 65.  04/19/17 Patient is here today for her regarding her medication. However she is visibly winded simply walking to the exam room. She has sinus tachycardia with minimal activity. Pulse oximetry is 98% on room air. We ambulate more than 120 feet and it remains 97% however her heart rate jumps to 140 bpm with very minimal activity and she demonstrates an increased respiratory rate, with increased work of breathing with minimal activity beyond exam findings. She has trace bipedal edema area the lungs are clear to auscultation. Heart shows normal sinus rhythm with tachycardia but no valvular abnormalities are appreciated. She denies any chest pain. She denies any pleurisy or hemoptysis. Past Medical History:  Diagnosis Date  . Anemia   . Depression   . Hypertension   . IBS (irritable bowel syndrome)   . Scoliosis   . Sleep apnea    Current Outpatient Prescriptions on File Prior to Visit  Medication Sig Dispense Refill  . venlafaxine XR (EFFEXOR-XR) 75 MG 24 hr capsule TAKE 2 CAPSULES (150 MG TOTAL) BY MOUTH DAILY. 180 capsule 4  . losartan (COZAAR) 50 MG tablet Take 1 tablet (50 mg total) by mouth daily. (Patient not taking: Reported on 04/19/2017) 90 tablet 3  . propranolol (INDERAL) 20 MG tablet Take 1 tablet (20 mg total) by mouth 3 (three) times daily. (Patient not taking: Reported on 04/19/2017) 90 tablet 5   No current facility-administered medications on file prior to visit.    Allergies  Allergen Reactions  . Codeine     Vomit    Social History   Social History  . Marital status: Single    Spouse name: N/A  . Number of children: N/A  . Years  of education: N/A   Occupational History  . Not on file.   Social History Main Topics  . Smoking status: Former Research scientist (life sciences)  . Smokeless tobacco: Never Used  . Alcohol use No  . Drug use: No  . Sexual activity: Not Currently   Other Topics Concern  . Not on file   Social History Narrative  . No narrative on file      Review of Systems  All other systems reviewed and are negative.      Objective:   Physical Exam  Neck: No JVD present. No tracheal deviation present.  Cardiovascular: Normal rate, regular rhythm, normal heart sounds and intact distal pulses.  Exam reveals no gallop and no friction rub.   No murmur heard. Pulmonary/Chest: Effort normal and breath sounds normal. No stridor. No respiratory distress. She has no wheezes. She has no rales.  Abdominal: Soft. Bowel sounds are normal. She exhibits no distension. There is no  tenderness. There is no rebound.  Musculoskeletal: She exhibits no edema.  Vitals reviewed.         Assessment & Plan:  Dyspnea on exertion - Plan: CBC with Differential/Platelet, COMPLETE METABOLIC PANEL WITH GFR, TSH, ECHOCARDIOGRAM COMPLETE, D-dimer, quantitative (not at [facility])  Chronic fatigue - Plan: CBC with Differential/Platelet, COMPLETE METABOLIC PANEL WITH GFR, TSH, ECHOCARDIOGRAM COMPLETE, D-dimer, quantitative (not at [facility])  Tachycardia - Plan: TSH, D-dimer, quantitative (not at [facility])   I am concerned by the patient's dyspnea on exertion. I'll perform pulmonary function test today to evaluate for lung abnormalities. Given her tachycardia and fatigue, when check a d-dimer as well as a TSH. If d-dimer is elevated, I'll perform a CT angiogram of the chest to evaluate for pulmonary embolism. If not elevated, I will have the patient get a simple chest x-ray. I will also have the patient perform an echocardiogram to evaluate for cardiomyopathy. Patient's FEV1 to FVC ratio was 76%. Her FEV1 was 2.3 L which is 80% of predicted. Her FVC is 3.03 L  which is 83% of predicted. There is no evidence of restrictive or obstructive lung disease. Proceed with the workup as discussed above.

## 2017-04-20 LAB — COMPLETE METABOLIC PANEL WITH GFR
ALBUMIN: 4.3 g/dL (ref 3.6–5.1)
ALK PHOS: 105 U/L (ref 33–130)
ALT: 14 U/L (ref 6–29)
AST: 15 U/L (ref 10–35)
BUN: 18 mg/dL (ref 7–25)
CALCIUM: 10 mg/dL (ref 8.6–10.4)
CO2: 24 mmol/L (ref 20–31)
Chloride: 105 mmol/L (ref 98–110)
Creat: 1 mg/dL — ABNORMAL HIGH (ref 0.50–0.99)
GFR, EST NON AFRICAN AMERICAN: 59 mL/min — AB (ref >=60)
GFR, Est African American: 68 mL/min (ref >=60)
Glucose, Bld: 108 mg/dL — ABNORMAL HIGH (ref 70–99)
POTASSIUM: 4.4 mmol/L (ref 3.5–5.3)
Sodium: 142 mmol/L (ref 135–146)
Total Bilirubin: 0.4 mg/dL (ref 0.2–1.2)
Total Protein: 6.7 g/dL (ref 6.1–8.1)

## 2017-04-20 LAB — CBC WITH DIFFERENTIAL/PLATELET
BASOS ABS: 78 {cells}/uL (ref 0–200)
Basophils Relative: 1 %
Eosinophils Absolute: 234 cells/uL (ref 15–500)
Eosinophils Relative: 3 %
HEMATOCRIT: 47.6 % — AB (ref 35.0–45.0)
HEMOGLOBIN: 15.7 g/dL — AB (ref 12.0–15.0)
LYMPHS ABS: 2340 {cells}/uL (ref 850–3900)
Lymphocytes Relative: 30 %
MCH: 29.6 pg (ref 27.0–33.0)
MCHC: 33 g/dL (ref 32.0–36.0)
MCV: 89.6 fL (ref 80.0–100.0)
MONO ABS: 390 {cells}/uL (ref 200–950)
MPV: 9.5 fL (ref 7.5–12.5)
Monocytes Relative: 5 %
Neutro Abs: 4758 cells/uL (ref 1500–7800)
Neutrophils Relative %: 61 %
Platelets: 388 10*3/uL (ref 140–400)
RBC: 5.31 MIL/uL — ABNORMAL HIGH (ref 3.80–5.10)
RDW: 14.2 % (ref 11.0–15.0)
WBC: 7.8 10*3/uL (ref 3.8–10.8)

## 2017-04-20 LAB — TSH: TSH: 1.96 m[IU]/L

## 2017-04-20 LAB — D-DIMER, QUANTITATIVE: D-Dimer, Quant: 0.29 mcg/mL FEU (ref <0.50)

## 2017-04-21 ENCOUNTER — Encounter: Payer: Self-pay | Admitting: Family Medicine

## 2017-04-21 ENCOUNTER — Other Ambulatory Visit: Payer: Self-pay | Admitting: Family Medicine

## 2017-04-21 DIAGNOSIS — R0609 Other forms of dyspnea: Principal | ICD-10-CM

## 2017-04-25 ENCOUNTER — Telehealth: Payer: Self-pay | Admitting: Family Medicine

## 2017-04-25 NOTE — Telephone Encounter (Signed)
Pt calling stating we were supposed to get a referral for her to a cardiologist.

## 2017-04-25 NOTE — Telephone Encounter (Signed)
I ordered echo at [facility].  Can we check on status of echo.  If she prefers to see cards instead and let them order ECHO, I am ok with that.

## 2017-04-26 NOTE — Telephone Encounter (Signed)
Pt did not want to see cards was just wondering if her Echo was being scheduled. Will send note to [facility] to see if we can speed this appt up some.

## 2017-05-05 ENCOUNTER — Ambulatory Visit (HOSPITAL_COMMUNITY): Payer: Medicare Other | Attending: Cardiology

## 2017-05-05 ENCOUNTER — Other Ambulatory Visit: Payer: Self-pay

## 2017-05-05 ENCOUNTER — Encounter: Payer: Self-pay | Admitting: Family Medicine

## 2017-05-05 DIAGNOSIS — R0609 Other forms of dyspnea: Secondary | ICD-10-CM | POA: Insufficient documentation

## 2017-05-05 DIAGNOSIS — R5382 Chronic fatigue, unspecified: Secondary | ICD-10-CM | POA: Diagnosis not present

## 2017-05-25 ENCOUNTER — Telehealth: Payer: Self-pay | Admitting: Family Medicine

## 2017-05-25 NOTE — Telephone Encounter (Signed)
[facility] called to say pt had referral appt made for 06/23/17 with Dr Melvyn Novas.  She called their office today and canceled appt.  Stated she does not feel she needs it any more.

## 2017-06-23 ENCOUNTER — Institutional Professional Consult (permissible substitution): Payer: Medicare Other | Admitting: Internal Medicine

## 2017-07-01 ENCOUNTER — Other Ambulatory Visit: Payer: Self-pay | Admitting: Family Medicine

## 2017-07-01 DIAGNOSIS — F329 Major depressive disorder, single episode, unspecified: Secondary | ICD-10-CM

## 2017-07-01 DIAGNOSIS — F32A Depression, unspecified: Secondary | ICD-10-CM

## 2017-08-08 DIAGNOSIS — Z23 Encounter for immunization: Secondary | ICD-10-CM | POA: Diagnosis not present

## 2017-08-10 ENCOUNTER — Encounter: Payer: Self-pay | Admitting: Family Medicine

## 2017-08-19 ENCOUNTER — Encounter: Payer: Self-pay | Admitting: Family Medicine

## 2017-08-19 ENCOUNTER — Other Ambulatory Visit: Payer: Self-pay

## 2017-08-19 ENCOUNTER — Ambulatory Visit (INDEPENDENT_AMBULATORY_CARE_PROVIDER_SITE_OTHER): Payer: Medicare Other | Admitting: Family Medicine

## 2017-08-19 VITALS — BP 162/100 | HR 74 | Temp 98.4°F | Resp 14 | Ht 70.5 in | Wt 261.0 lb

## 2017-08-19 DIAGNOSIS — F324 Major depressive disorder, single episode, in partial remission: Secondary | ICD-10-CM | POA: Diagnosis not present

## 2017-08-19 DIAGNOSIS — I10 Essential (primary) hypertension: Secondary | ICD-10-CM | POA: Diagnosis not present

## 2017-08-19 MED ORDER — VENLAFAXINE HCL ER 75 MG PO CP24: 150.0000 mg | ORAL_CAPSULE | Freq: Every day | ORAL | 3 refills | Status: DC

## 2017-08-19 MED ORDER — LOSARTAN POTASSIUM 50 MG PO TABS: 50.0000 mg | ORAL_TABLET | Freq: Every day | ORAL | 3 refills | Status: DC

## 2017-08-19 NOTE — Progress Notes (Addendum)
Subjective:    Patient ID: Melanie Hurst, female    DOB: 1951-02-08, 66 y.o.   MRN: [id number]  Medication Refill    04/19/17 Patient is here today for her regarding her medication. However she is visibly winded simply walking to the exam room. She has sinus tachycardia with minimal activity. Pulse oximetry is 98% on room air. We ambulate more than 120 feet and it remains 97% however her heart rate jumps to 140 bpm with very minimal activity and she demonstrates an increased respiratory rate, with increased work of breathing with minimal activity beyond exam findings. She has trace bipedal edema area the lungs are clear to auscultation. Heart shows normal sinus rhythm with tachycardia but no valvular abnormalities are appreciated. She denies any chest pain. She denies any pleurisy or hemoptysis.  At that time, my plan was:  I am concerned by the patient's dyspnea on exertion. I'll perform pulmonary function test today to evaluate for lung abnormalities. Given her tachycardia and fatigue, when check a d-dimer as well as a TSH. If d-dimer is elevated, I'll perform a CT angiogram of the chest to evaluate for pulmonary embolism. If not elevated, I will have the patient get a simple chest x-ray. I will also have the patient perform an echocardiogram to evaluate for cardiomyopathy. Patient's FEV1 to FVC ratio was 76%. Her FEV1 was 2.3 L which is 80% of predicted. Her FVC is 3.03 L which is 83% of predicted. There is no evidence of restrictive or obstructive lung disease. Proceed with the workup as discussed above.  08/19/17 Echo: Study Conclusions  - Left ventricle: The cavity size was normal. Systolic function was   normal. The estimated ejection fraction was in the range of 60%   to 65%. Wall motion was normal; there were no regional wall   motion abnormalities. Left ventricular diastolic function   parameters were normal. - Aortic valve: Trileaflet; mildly thickened, mildly calcified    leaflets. - Mitral valve: There was trivial regurgitation. - Right ventricle: The cavity size was mildly dilated. Wall   thickness was normal. - Tricuspid valve: There was trivial regurgitation.   Recommended pulm consult but patient cancelled the appointment.  She continues to complain of shortness of breath with activity but she is extremely sedentary and I believe this is due to poor stamina.  Her blood pressure today is extremely high.  She originally came just to get a refill on her venlafaxine.  She has not been taking her losartan.  She denies any chest pain.  She is not checking her blood pressure at home.  She also has a little bit of a pill-rolling tremor in her right hand that I have not noticed previously.  She has had an essential tremor but this seems to be a resting tremor that goes away with activity.  She denies any shuffling gait, flat affect, masklike facies stumbling or falls.  But this is something that we need to keep an eye on Past Medical History:  Diagnosis Date  . Anemia   . Depression   . Hypertension   . IBS (irritable bowel syndrome)   . Scoliosis   . Sleep apnea    Current Outpatient Medications on File Prior to Visit  Medication Sig Dispense Refill  . venlafaxine XR (EFFEXOR-XR) 75 MG 24 hr capsule TAKE TWO CAPSULES BY MOUTH DAILY 180 capsule 1   No current facility-administered medications on file prior to visit.    Allergies  Allergen Reactions  . Asa [Aspirin]  Hives  . Codeine     Vomit    Social History   Socioeconomic History  . Marital status: Single    Spouse name: Not on file  . Number of children: Not on file  . Years of education: Not on file  . Highest education level: Not on file  Social Needs  . Financial resource strain: Not on file  . Food insecurity - worry: Not on file  . Food insecurity - inability: Not on file  . Transportation needs - medical: Not on file  . Transportation needs - non-medical: Not on file  Occupational  History  . Not on file  Tobacco Use  . Smoking status: Former Research scientist (life sciences)  . Smokeless tobacco: Never Used  Substance and Sexual Activity  . Alcohol use: No  . Drug use: No  . Sexual activity: Not Currently  Other Topics Concern  . Not on file  Social History Narrative  . Not on file      Review of Systems  All other systems reviewed and are negative.      Objective:   Physical Exam  Neck: No JVD present. No tracheal deviation present.  Cardiovascular: Normal rate, regular rhythm, normal heart sounds and intact distal pulses. Exam reveals no gallop and no friction rub.  No murmur heard. Pulmonary/Chest: Effort normal and breath sounds normal. No stridor. No respiratory distress. She has no wheezes. She has no rales.  Abdominal: Soft. Bowel sounds are normal. She exhibits no distension. There is no tenderness. There is no rebound.  Musculoskeletal: She exhibits no edema.  Vitals reviewed.         Assessment & Plan:  Ess Htn Resume losartan 50 mg a day and recheck blood pressure in 1 month.  I will gladly refill her Effexor.  I brought the pill rolling tremor to her attention.  Otherwise she has no parkinsonian features.  We will continue to monitor this at the present time for any change or worsening

## 2017-08-25 ENCOUNTER — Other Ambulatory Visit: Payer: Self-pay | Admitting: Family Medicine

## 2017-08-25 ENCOUNTER — Encounter: Payer: Self-pay | Admitting: Family Medicine

## 2017-08-25 MED ORDER — MODAFINIL 200 MG PO TABS: 200.0000 mg | ORAL_TABLET | Freq: Every day | ORAL | 0 refills | Status: DC

## 2017-08-25 NOTE — Progress Notes (Signed)
Patient has a history of chronic fatigue.  Today at her husband's office visit, she discussed with me resuming modafinil 200 mg a day for chronic fatigue.  I explained that I want her to monitor her blood pressure mostly on this medication and then recheck with me in 1 month but I would be willing to restart her on modafinil 200 mg p.o. every morning.  Recheck in 1 month.  Patient was given a prescription today in clinic

## 2017-09-04 ENCOUNTER — Encounter: Payer: Self-pay | Admitting: Family Medicine

## 2017-09-26 ENCOUNTER — Ambulatory Visit: Payer: Medicare Other | Admitting: Family Medicine

## 2017-09-26 VITALS — BP 150/90

## 2017-09-26 DIAGNOSIS — I10 Essential (primary) hypertension: Secondary | ICD-10-CM

## 2017-09-26 NOTE — Progress Notes (Signed)
Pt came for nurse visit BP check.  Per chart Dr Dennard Schaumann had resumed her Modafinal and wanted to see her back in hte office in 1 month.  BP was taken and appt made this week to come see provider

## 2017-09-29 ENCOUNTER — Ambulatory Visit (INDEPENDENT_AMBULATORY_CARE_PROVIDER_SITE_OTHER): Payer: Medicare Other | Admitting: Family Medicine

## 2017-09-29 ENCOUNTER — Encounter: Payer: Self-pay | Admitting: Family Medicine

## 2017-09-29 VITALS — BP 150/80 | HR 70 | Temp 98.6°F | Resp 18 | Ht 70.25 in | Wt 262.0 lb

## 2017-09-29 DIAGNOSIS — G4719 Other hypersomnia: Secondary | ICD-10-CM | POA: Diagnosis not present

## 2017-09-29 DIAGNOSIS — I10 Essential (primary) hypertension: Secondary | ICD-10-CM

## 2017-09-29 MED ORDER — MODAFINIL 200 MG PO TABS: 200.0000 mg | ORAL_TABLET | Freq: Every day | ORAL | 3 refills | Status: DC

## 2017-09-29 MED ORDER — LOSARTAN POTASSIUM 50 MG PO TABS: 50.0000 mg | ORAL_TABLET | Freq: Two times a day (BID) | ORAL | 3 refills | Status: DC

## 2017-09-29 MED ORDER — MODAFINIL 200 MG PO TABS: 200.0000 mg | ORAL_TABLET | Freq: Every day | ORAL | 0 refills | Status: DC

## 2017-09-29 NOTE — Progress Notes (Signed)
Subjective:    Patient ID: Melanie Hurst, female    DOB: 06/23/1951, 66 y.o.   MRN: [id number]  HPI Patient has been diagnosed with obstructive sleep apnea more than a decade ago. She was seeing a sleep specialist and has been diagnosed with obstructive sleep apnea and placed on CPAP machine. She has not had a repeat sleep study in more than 10 years to determine if the settings are appropriate. Over the last several months, she reports increasing excessive daytime sleepiness and hypersomnolence. In the past she was diagnosed with excessive daytime sleepiness and was treated with Provigil 200 mg daily in addition to her CPAP at night due to the persistence of her symptoms despite adequate treatment of obstructive sleep apnea with CPAP. She asked me to prescribe Provigil for her last month. Since starting 200 mg Provigil, the patient feels much better. Her sleepiness has improved. Her energy level and chronic fatigue has improved. However her blood pressure is elevated today. Her average blood pressure has been ranging between 235 and 573 systolic. She denies any palpitations, tachycardia, trouble breathing, chest pain, shortness of breath, or dyspnea on exertion. Past Medical History:  Diagnosis Date  . Anemia   . Depression   . Hypertension   . IBS (irritable bowel syndrome)   . Scoliosis   . Sleep apnea    Past Surgical History:  Procedure Laterality Date  . TONSILECTOMY, ADENOIDECTOMY, BILATERAL MYRINGOTOMY AND TUBES  1962   Current Outpatient Medications on File Prior to Visit  Medication Sig Dispense Refill  . venlafaxine XR (EFFEXOR-XR) 75 MG 24 hr capsule Take 2 capsules (150 mg total) daily by mouth. 180 capsule 3   No current facility-administered medications on file prior to visit.    Allergies  Allergen Reactions  . Asa [Aspirin] Hives  . Codeine     Vomit    Social History   Socioeconomic History  . Marital status: Single    Spouse name: Not on file  . Number of  children: Not on file  . Years of education: Not on file  . Highest education level: Not on file  Social Needs  . Financial resource strain: Not on file  . Food insecurity - worry: Not on file  . Food insecurity - inability: Not on file  . Transportation needs - medical: Not on file  . Transportation needs - non-medical: Not on file  Occupational History  . Not on file  Tobacco Use  . Smoking status: Former Research scientist (life sciences)  . Smokeless tobacco: Never Used  Substance and Sexual Activity  . Alcohol use: No  . Drug use: No  . Sexual activity: Not Currently  Other Topics Concern  . Not on file  Social History Narrative  . Not on file      Review of Systems  All other systems reviewed and are negative.      Objective:   Physical Exam  Constitutional: She is oriented to person, place, and time. She appears well-developed and well-nourished.  Cardiovascular: Normal rate, normal heart sounds and intact distal pulses.  No murmur heard. Pulmonary/Chest: Effort normal and breath sounds normal. No respiratory distress. She has no wheezes.  Abdominal: Soft. Bowel sounds are normal. She exhibits no distension. There is no tenderness. There is no rebound.  Neurological: She is alert and oriented to person, place, and time. She has normal reflexes. She displays normal reflexes. No cranial nerve deficit. She exhibits normal muscle tone. Coordination normal.  Vitals reviewed.  Assessment & Plan:  Excessive daytime sleepiness - Plan: modafinil (PROVIGIL) 200 MG tablet, DISCONTINUED: modafinil (PROVIGIL) 200 MG tablet  Essential hypertension  I recommended referral to a sleep specialist for a sleep study to determine if obstructive sleep apnea could be causing her excessive daytime sleepiness. I explained to the patient that even though the Provigil fails with the symptoms, if her hypersomnolence is due to obstructive sleep apnea, we will not be treating the cause adequately. I want to  make sure that her current CPAP machine and its current settings are appropriate and her body has changed substantially over the last 10 years. She can continue Provigil 200 mg daily until she has the sleep study. Also recommended increasing losartan 50 mg twice daily to better control her blood pressure

## 2017-10-08 ENCOUNTER — Encounter: Payer: Self-pay | Admitting: Family Medicine

## 2017-11-07 ENCOUNTER — Encounter: Payer: Self-pay | Admitting: Neurology

## 2017-11-07 ENCOUNTER — Ambulatory Visit (INDEPENDENT_AMBULATORY_CARE_PROVIDER_SITE_OTHER): Payer: Medicare Other | Admitting: Neurology

## 2017-11-07 VITALS — BP 134/85 | HR 87 | Ht 70.0 in | Wt 268.0 lb

## 2017-11-07 DIAGNOSIS — G4719 Other hypersomnia: Secondary | ICD-10-CM

## 2017-11-07 DIAGNOSIS — R259 Unspecified abnormal involuntary movements: Secondary | ICD-10-CM

## 2017-11-07 DIAGNOSIS — Z9989 Dependence on other enabling machines and devices: Secondary | ICD-10-CM | POA: Diagnosis not present

## 2017-11-07 DIAGNOSIS — M4135 Thoracogenic scoliosis, thoracolumbar region: Secondary | ICD-10-CM

## 2017-11-07 DIAGNOSIS — G20 Parkinson's disease: Secondary | ICD-10-CM | POA: Diagnosis not present

## 2017-11-07 DIAGNOSIS — G20C Parkinsonism, unspecified: Secondary | ICD-10-CM | POA: Insufficient documentation

## 2017-11-07 DIAGNOSIS — F339 Major depressive disorder, recurrent, unspecified: Secondary | ICD-10-CM | POA: Diagnosis not present

## 2017-11-07 DIAGNOSIS — E669 Obesity, unspecified: Secondary | ICD-10-CM | POA: Insufficient documentation

## 2017-11-07 DIAGNOSIS — G4733 Obstructive sleep apnea (adult) (pediatric): Secondary | ICD-10-CM | POA: Insufficient documentation

## 2017-11-07 MED ORDER — MODAFINIL 200 MG PO TABS: 200.0000 mg | ORAL_TABLET | Freq: Every day | ORAL | 5 refills | Status: DC

## 2017-11-07 MED ORDER — CARBIDOPA-LEVODOPA 25-100 MG PO TABS: 1.0000 | ORAL_TABLET | Freq: Three times a day (TID) | ORAL | 5 refills | Status: DC

## 2017-11-07 NOTE — Patient Instructions (Signed)
I have asked Melanie Hurst to be available for a split-night polysomnography, should the co-pay or self deductible exceeded her financial possibilities, I would be happy to change it to a home sleep test.  My goal is to give her a new CPAP machine as soon as possible.  For this reason I have set a low split-night barrier.  She also presents with a movement disorder stating that it started as an action tremor and now has a resting component as well.  While it could be related to her long-standing depression and treatment with Effexor, I have noticed some cogwheeling and loss of arm swing when she walks.  I suspect that she may have Parkinson's disease and for this reason have started her on Sinemet 25/1 100 mg to be taking 40 minutes before each meal 3 times a day.  Further workup for Parkinson's disease I have ordered an MRI of the brain without contrast, her creatinine and liver function tests were fine as of August last year but I will need more recent laboratory results.  I would also like for her to see an orthopedist given that she has a significant scoliosis which may also perform a spinal stenosis as she ages.  This may explain some of the weakness in her hip flexion.  I did not see muscle atrophy, and  her reflexes are actually decreased.   Sleep Apnea Sleep apnea is a condition that affects breathing. People with sleep apnea have moments during sleep when their breathing pauses briefly or gets shallow. Sleep apnea can cause these symptoms:  Trouble staying asleep.  Sleepiness or tiredness during the day.  Irritability.  Loud snoring.  Morning headaches.  Trouble concentrating.  Forgetting things.  Less interest in sex.  Being sleepy for no reason.  Mood swings.  Personality changes.  Depression.  Waking up a lot during the night to pee (urinate).  Dry mouth.  Sore throat.  Follow these instructions at home:  Make any changes in your routine that your doctor  recommends.  Eat a healthy, well-balanced diet.  Take over-the-counter and prescription medicines only as told by your doctor.  Avoid using alcohol, calming medicines (sedatives), and narcotic medicines.  Take steps to lose weight if you are overweight.  If you were given a machine (device) to use while you sleep, use it only as told by your doctor.  Do not use any tobacco products, such as cigarettes, chewing tobacco, and e-cigarettes. If you need help quitting, ask your doctor.  Keep all follow-up visits as told by your doctor. This is important. Contact a doctor if:  The machine that you were given to use during sleep is uncomfortable or does not seem to be working.  Your symptoms do not get better.  Your symptoms get worse. Get help right away if:  Your chest hurts.  You have trouble breathing in enough air (shortness of breath).  You have an uncomfortable feeling in your back, arms, or stomach.  You have trouble talking.  One side of your body feels weak.  A part of your face is hanging down (drooping). These symptoms may be an emergency. Do not wait to see if the symptoms will go away. Get medical help right away. Call your local emergency services (911 in the [place] Do not drive yourself to the hospital. This information is not intended to replace advice given to you by your health care provider. Make sure you discuss any questions you have with your health care provider.  Document Released: 07/06/2008 Document Revised: 05/23/2016 Document Reviewed: 07/07/2015 Elsevier Interactive Patient Education  [employer].

## 2017-11-07 NOTE — Progress Notes (Signed)
SLEEP MEDICINE CLINIC   Provider:  Larey Seat, [place] D  Primary Care Physician:  [name], MD   Referring Provider: [name], MD    Chief Complaint  Patient presents with  . New Patient (Initial Visit)    pt alone, rm 10. pt had a sleep [facility] probably in [facility] [place]. started CPAP at that time. the patient feels that the machine is on last leg and states that she feeling more tired now.     HPI:  Melanie Hurst is a 67 y.o. female , seen here in a referral from Dr. Dennard Schaumann for transfer of OSA/ CPAP care. She uses the some machine for 18 years. She herself indicated she wants to be evaluated for tremor / movement disorder.   [name] is a 67 year old right-handed Caucasian female presents here today with a [employer] CPAP machine from the early 2000.  She was diagnosed with OSA in another state 19 years ago she had no repeat sleep studies and has been on her first and only CPAP machine ever since.  Over the last 6-7 months she had noted increasing daytime sleepiness-hypersomnolence.  For a while when she had started CPAP she still remained very sleepy and was therefore treated with Provigil.  Her sleepiness improved and her energy level improved as well.  But her blood pressures have been elevated and there is a question how well the machine still treats first apnea, and also her Provigil is no longer as effective.  Provigil can lead to elevated blood pressures, but incomplete sleep apnea care and treatment can also residual hypertension.  The patient reports that she is not snoring and that she carries also a diagnosis of scoliosis, irritable bowel, depression, hypertension and anemia she is not currently only on Provigil, aspirin, and losartin for blood pressure.  She has also a second concern that she mentioned in our interview.  She had developed a right dominant tremor at rest, often for her barely noticeable until she  looks at her hands.  This tremor has exacerbated and gained a higher amplitude over the last 12-18 months and now she has noticed a pill-rolling tremor in her left hand as well. This movement disorder also needs to be addressed, but we will make a follow up visit. The patient has no history of restless legs no symptoms, to her knowledge she does not enact dreams.   Chief complaint according to patient : needs evaluation for recurrent excessive daytime sleepiness while compliantly on CPAP - machine is 67 years old and last sleep study was 19 years ago.   Sleep habits are as follows: Patient usually enters the marital bedroom at around 9 PM she will fall asleep before her husband joins her.  She usually can go to sleep by the promptly  But has difficulties to stay asleep through the night.  She usually has a bathroom break between 2.30 and 3.30 Am, this began about a year ago.  Prior to this she slept through the night without nocturia.  She sleeps on her sides and on her back, supported by one pillow.  He estimates a nocturnal sleep time total of about 6 or 7 hours.  There has been  this long break in sleep in the middle of the night / early morning for at least 12 months.  She feels that she is able to go back to sleep around 5 or 6 AM, she then has to  rise when she just got back to sleep. She sets her alarm for 7 AM and feels " not rested and restored,  I crave more sleep and can't get it ".  Daily naps ( 4 out of 7 days), after lunch time-within 30 minutes after eating.  These last about an hour, and she will sometimes dream.  Sleep medical history and family sleep history: One sister died of complications with breast cancer, she did not have a sleep disorder -[name]'s father was diagnosed with obstructive sleep apnea late in life.  No children.  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  She smoked through college but quit at age25, and relapsed in 2013- quit in 2014.  The patient used to drink a lot of soda but recently cut down, she would drink a 6 pack of diet soda a day, treatment with aspartame, for 12 years.  She will drink iced tea with dinner, but she does not report caffeine use or energy drink use.   Review of Systems: Out of a complete 14 system review, the patient complains of only the following symptoms, and all other reviewed systems are negative.  hoarse voice, trembling, right dominant hand tremor 18 month ago, followed now by resting pill rolling tremor on the left hand.  Cog-wheeling, micrographia, and haorseness.   has mildly decreased facial [place].  EDS, no snoring - husband   Epworth Sleepiness score endorsed at 17 out of 24 points. ,  Fatigue severity score n/ , geriatric depression score 8 out 15 points    Social History   Socioeconomic History  . Marital status: Single    Spouse name: Not on file  . Number of children: Not on file  . Years of education: Not on file  . Highest education level: Not on file  Social Needs  . Financial resource strain: Not on file  . Food insecurity - worry: Not on file  . Food insecurity - inability: Not on file  . Transportation needs - medical: Not on file  . Transportation needs - non-medical: Not on file  Occupational History  . Not on file  Tobacco Use  . Smoking status: Former Research scientist (life sciences)  . Smokeless tobacco: Never Used  Substance and Sexual Activity  . Alcohol use: No  . Drug use: No  . Sexual activity: Not Currently  Other Topics Concern  . Not on file  Social History Narrative  . Not on file    No family history on file.  Past Medical History:  Diagnosis Date  . Anemia   . Depression   . Hypertension   . IBS (irritable bowel syndrome)   . Scoliosis   . Sleep apnea     Past Surgical History:  Procedure Laterality Date  . TONSILECTOMY, ADENOIDECTOMY, BILATERAL  MYRINGOTOMY AND TUBES  1962    Current Outpatient Medications  Medication Sig Dispense Refill  . losartan (COZAAR) 50 MG tablet Take 1 tablet (50 mg total) by mouth 2 (two) times daily. 180 tablet 3  . modafinil (PROVIGIL) 200 MG tablet Take 1 tablet (200 mg total) by mouth daily. 30 tablet 3  . venlafaxine XR (EFFEXOR-XR) 75 MG 24 hr capsule Take 2 capsules (150 mg total) daily by mouth. 180 capsule 3   No current facility-administered medications for this visit.     Allergies as of 11/07/2017 - Review Complete 11/07/2017  Allergen  Reaction Noted  . Asa [aspirin] Hives 08/19/2017  . Codeine  03/15/2013    Vitals: BP 134/85   Pulse 87   Ht 5\' 10"  (1.778 m)   Wt 268 lb (121.6 kg)   BMI 38.45 kg/m  Last Weight:  Wt Readings from Last 1 Encounters:  11/07/17 268 lb (121.6 kg)   HAL:PFXT mass index is 38.45 kg/m.     Last Height:   Ht Readings from Last 1 Encounters:  11/07/17 5\' 10"  (1.778 m)    Physical exam:  General: The patient is awake, alert and appears not in acute distress. The patient is well groomed. Head: Normocephalic, atraumatic. Neck is supple. Mallampati 3,  neck circumference 15. Nasal airflow restricted/ congested ,  Retrognathia is seen. She has a diminutive lower jaw, has tried a mouth device for snoring - did not work. Tried chin strap- did not work.  Cardiovascular:  Regular rate and rhythm, without  murmurs or carotid bruit, and without distended neck veins. Respiratory: Lungs are clear to auscultation. Skin:  Without evidence of rash, She also has slight swelling over the ankles.  Trunk: BMI is 38. 5 . The patient's posture is stooped and there is significant thoracic scoliosis .   Neurologic exam : The patient is awake and alert, oriented to place and time.   Memory subjective described as intact.   Attention span & concentration ability appears normal.  Speech is fluent,  without  dysarthria, but slight quivering in her voice- dysphonia .Mood and  affect are appropriate.  Cranial nerves: Pupils are equal and briskly reactive to light. Funduscopic exam without  evidence of pallor or edema. Extraocular movements  in vertical and horizontal planes intact and without nystagmus. Visual fields by finger perimetry are intact. Hearing to finger rub intact. Facial sensation intact to fine touch.Facial motor strength is symmetric, and tongue and uvula move midline without tremor. Shoulder shrug was symmetrical.   Motor exam:   Also the patient's muscle bulk appears symmetric, her left shoulder rests above the right shoulder level, she has a history of scoliosis, she does have bilateral cogwheeling over both biceps, most expressed on the right.  There is also tremor at rest noted, there is no leg tremor noticed but significant weakness with hip flexion while hip adduction and knee extension are intact.  The patient often uses a cane to support herself when she walks.    Sensory:  Fine touch, pinprick and vibration were tested in all extremities. Proprioception tested in the upper extremities was normal. Coordination: Rapid alternating movements and  Finger-to-nose maneuver are  with evidence of tremor. Handwriting has become slightly smaller as she writes and is more tremor impacted.  Gait and station: Patient walks with a cane as assistive device and is able  to climb up to the exam table. She bought the cane 10 years ago for spinal scoliosis and stenosis related gait disorder.  Stance is impaired, stooped, truncal  Tremor.  As the patient walks her right foot points more outwards, she does have decreased arm swing with both hands fisted, but I noticed the tremor at the wrist.  Getting fasting as she walks, with slight propulsive tendency.  Her scoliosis very visible thoracic and lumbar.  Tandem gait deferred, turns with 6 Steps !. Romberg testing is positive - she has fallen propulsive when bending down.  Deep tendon reflexes: in the  upper and lower  extremities are symmetrically attenuated-  Can not elicit any patella reflex,  Babinski maneuver  response is equivocal.    Assessment:  After physical and neurologic examination, review of laboratory studies,  Personal review of imaging studies, reports of other /same  Imaging studies, results of polysomnography and / or neurophysiology testing and pre-existing records as far as provided in visit., my assessment is   1) As to Mrs. Duerksen sleep disorder, I will have to invite her for a sleep study to that her apnea diagnosis can be reestablished and treated and you this time was a different noise machine.  It may be a dysfunction on her current machine can be responsible for the resurgence of excessive daytime sleepiness , and need to take more Provigil.  Unfortunately, a 67 year old machine does not give me therapeutic data.  2) excessive daytime sleepiness can be continuously treated with Provigil in a patient who has been compliant and successful in treatment of her underlying obstructive sleep apnea.  I will definitely pick up that prescription for the patient.  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  In spite of her scoliosis related gait abnormalities she has more propulsive, as reduced arm swing, pill-rolling tremor and she has returned with 6 steps self location that her risk of falling is higher.  I also noticed cogwheeling and weakness of the hip flexors the first 1 a sign of parkinsonian muscle tone elevation, the second indicating a spinal stenosis.  She has neither neurosurgeon or orthopedist following her spinal scoliosis.  The patient was advised of the nature of the diagnosed disorder , the treatment options and the  risks for general health and wellness arising from not treating the condition.   I spent more than 60 minutes of face to face time with the patient.  Greater than 50% of  time was spent in counseling and coordination of care. We have discussed the diagnosis and differential and I answered the patient's questions.    Plan:  Treatment plan and additional workup :  SPLIT night , SPLIT at [facility] 15 and 4 % score- patient needs ot know her co pay - is quite concerned about that.  Modafinil refill- 200 mg provigil.  Parkinson's disease is suspected, I will treat with sinemet 25/ 100 mg tid po when we meet after sleep test.  MRI brain - without contrast - patient has only basic medicare , will discuss further after sleep test. Scoliosis- may be stenosis ,too.  Patient needs to see orthopedist for follow up.  Rv after sleep test.    Larey Seat, MD 5/32/0233, 4:35 PM  Certified in Neurology by ABPN Certified in [place] by [facility] [address] [place], [place] [address]

## 2017-11-08 ENCOUNTER — Telehealth: Payer: Self-pay | Admitting: Neurology

## 2017-11-08 LAB — COMPREHENSIVE METABOLIC PANEL
A/G RATIO: 2 (ref 1.2–2.2)
ALT: 13 IU/L (ref 0–32)
AST: 14 IU/L (ref 0–40)
Albumin: 4.6 g/dL (ref 3.6–4.8)
Alkaline Phosphatase: 122 IU/L — ABNORMAL HIGH (ref 39–117)
BUN/Creatinine Ratio: 17 (ref 12–28)
BUN: 15 mg/dL (ref 8–27)
Bilirubin Total: 0.4 mg/dL (ref 0.0–1.2)
CO2: 22 mmol/L (ref 20–29)
Calcium: 9.3 mg/dL (ref 8.7–10.3)
Chloride: 104 mmol/L (ref 96–106)
Creatinine, Ser: 0.86 mg/dL (ref 0.57–1.00)
GFR, EST AFRICAN AMERICAN: 81 mL/min/{1.73_m2} (ref >59)
GFR, EST NON AFRICAN AMERICAN: 71 mL/min/{1.73_m2} (ref >59)
GLUCOSE: 87 mg/dL (ref 65–99)
Globulin, Total: 2.3 g/dL (ref 1.5–4.5)
Potassium: 5 mmol/L (ref 3.5–5.2)
Sodium: 143 mmol/L (ref 134–144)
TOTAL PROTEIN: 6.9 g/dL (ref 6.0–8.5)

## 2017-11-08 NOTE — Telephone Encounter (Signed)
-----   Message from Larey Seat, MD sent at 11/08/2017  9:07 AM EST ----- Normal CMET for age.

## 2017-11-08 NOTE — Telephone Encounter (Signed)
Called the patient and informed her of the normal labs.. Pt verbalized understanding. Pt had no questions at this time but was encouraged to call back if questions arise.

## 2017-11-28 ENCOUNTER — Ambulatory Visit
Admission: RE | Admit: 2017-11-28 | Discharge: 2017-11-28 | Disposition: A | Discharge status: Home / Self Care | Payer: Medicare Other | Source: Ambulatory Visit | Attending: Neurology | Admitting: Neurology

## 2017-11-28 DIAGNOSIS — G20 Parkinson's disease: Secondary | ICD-10-CM

## 2017-11-29 ENCOUNTER — Telehealth: Payer: Self-pay | Admitting: Neurology

## 2017-11-29 NOTE — Telephone Encounter (Signed)
-----   Message from Larey Seat, MD sent at 11/28/2017  4:20 PM EST ----- No evidence of vascular parkinsonism by MRI

## 2017-11-29 NOTE — Telephone Encounter (Signed)
Called and went over the results with the pt. No questions and the Pt verbalized understanding.

## 2017-12-07 ENCOUNTER — Ambulatory Visit (INDEPENDENT_AMBULATORY_CARE_PROVIDER_SITE_OTHER): Payer: Medicare Other | Admitting: Neurology

## 2017-12-07 DIAGNOSIS — E669 Obesity, unspecified: Secondary | ICD-10-CM

## 2017-12-07 DIAGNOSIS — G4733 Obstructive sleep apnea (adult) (pediatric): Secondary | ICD-10-CM | POA: Diagnosis not present

## 2017-12-07 DIAGNOSIS — Z9989 Dependence on other enabling machines and devices: Secondary | ICD-10-CM

## 2017-12-07 DIAGNOSIS — F339 Major depressive disorder, recurrent, unspecified: Secondary | ICD-10-CM

## 2017-12-07 DIAGNOSIS — R259 Unspecified abnormal involuntary movements: Secondary | ICD-10-CM

## 2017-12-07 DIAGNOSIS — M4135 Thoracogenic scoliosis, thoracolumbar region: Secondary | ICD-10-CM

## 2017-12-07 DIAGNOSIS — G4719 Other hypersomnia: Secondary | ICD-10-CM

## 2017-12-09 ENCOUNTER — Other Ambulatory Visit: Payer: Self-pay | Admitting: Neurology

## 2017-12-09 DIAGNOSIS — E661 Drug-induced obesity: Secondary | ICD-10-CM

## 2017-12-09 DIAGNOSIS — Z6838 Body mass index (BMI) 38.0-38.9, adult: Principal | ICD-10-CM

## 2017-12-09 DIAGNOSIS — G252 Other specified forms of tremor: Secondary | ICD-10-CM

## 2017-12-09 DIAGNOSIS — G4733 Obstructive sleep apnea (adult) (pediatric): Secondary | ICD-10-CM

## 2017-12-09 DIAGNOSIS — G4761 Periodic limb movement disorder: Secondary | ICD-10-CM

## 2017-12-09 NOTE — Procedures (Signed)
PATIENT'S NAME:  Melanie Hurst, Melanie Hurst DOB:      1951-02-28      MR#:    [id number]     DATE OF RECORDING: 12/07/2017 REFERRING M.D.:  Jenna Luo, MD Study Performed:   Baseline Polysomnogram HISTORY: Melanie Hurst, a 67 y.o. female patient needs to transfer her OSA/ CPAP care. She uses the same machine for 18 years.  Melanie Hurst is a 67 year old right-handed Caucasian female presents here today with a Respironics CPAP machine from the early 2000.  She [place], since had no repeat sleep studies, and has been on her first and only CPAP machine ever since.  Over the last 6-7 months she had noted increasing daytime sleepiness-hypersomnolence.  Remaining compliant on CPAP she was treated with Provigil.  Her sleepiness improved and her energy level improved as well.  But her blood pressures have been elevated and there is a question how well the machine still treats 1) first apnea, and also 2) why her Provigil is no longer as effective.  The patient reports that she also a diagnosis of scoliosis, irritable bowel, anxiety and depression, hypertension and anemia. She herself indicated she wants to be evaluated for tremor / movement disorder. The patient has no history of restless legs no symptoms, to her knowledge she does not enact dreams. The patient endorsed the Epworth Sleepiness Scale at 17/24 points.   The patient's weight 268 pounds with a height of 70 (inches), resulting in a BMI of 38.5 kg/m2. The patient's neck circumference measured 15 inches.  CURRENT MEDICATIONS: Cozaar, Provigil, Effexor.   PROCEDURE:  This is a multichannel digital polysomnogram utilizing the Somnostar 11.2 system.  Electrodes and sensors were applied and monitored per AASM Specifications.   EEG, EOG, Chin and Limb EMG, were sampled at 200 Hz.  ECG, Snore and Nasal Pressure, Thermal Airflow, Respiratory Effort, CPAP Flow and Pressure, Oximetry was sampled at 50 Hz. Digital video  and audio were recorded.      BASELINE STUDY: Lights Out was at 20:48 and Lights On at 04:25.  Total recording time (TRT) was 457.5 minutes, with a total sleep time (TST) of 368 minutes.   The patient's sleep latency was 31.5 minutes.  REM latency was 333.5 minutes.  The sleep efficiency was 80.4 %.     SLEEP ARCHITECTURE: WASO (Wake after sleep onset) was 54 minutes.  There were 20 minutes in Stage N1, 282.5 minutes Stage N2, 41.5 minutes Stage N3 and 24 minutes in Stage REM.  The percentage of Stage N1 was 5.4%, Stage N2 was 76.8%, Stage N3 was 11.3% and Stage R (REM sleep) was 6.5%.   RESPIRATORY ANALYSIS:  There were a total of 72 respiratory events:  54 obstructive apneas, 4 central apneas and 14 hypopneas with 0 respiratory event related arousals (RERAs).  The total APNEA/HYPOPNEA INDEX (AHI) was 11.7/hour and the total RESPIRATORY DISTURBANCE INDEX was 11.7 /hour.  7 events occurred in REM sleep and 25 events in NREM. The REM AHI was 17.5 /hour, versus a non-REM AHI of 11.3. The patient spent 174.5 minutes of total sleep time in the supine position and 194 minutes in non-supine. The supine AHI was 6.5 versus a non-supine AHI of 16.4.  OXYGEN SATURATION & C02:  The Wake baseline 02 saturation was 94%, with the lowest being 83%. Time spent below 89% saturation equaled 6 minutes.   PERIODIC LIMB MOVEMENTS:  The patient had a total of 251 Periodic Limb Movements.  The Periodic Limb Movement (PLM) index was 40.9 and the PLM Arousal index was 6.7/hour. The arousals were noted as: 57 were spontaneous, 41 were associated with PLMs, and 21 were associated with respiratory events. Audio and video analysis did not show any abnormal or unusual movements, behaviors, phonations or vocalizations.  The patient took one bathroom break. Loud Snoring was noted. EKG was in keeping with normal sinus rhythm (NSR). Post-study, the patient indicated that sleep was the same as usual.   IMPRESSION:  1. Overall Mild  degree of Obstructive Sleep Apnea (OSA AHI of 11.7/hr.), without prolonged oxygen desaturation, but associated with Loud Snoring. REM accentuated OSA with AHI in REM of 17.5/hr.  2. Severe Periodic Limb Movement Disorder (PLMD)  RECOMMENDATIONS:  1. Advise full-night, attended, CPAP titration study to optimize therapy and to see if PLMs also respond to CPAP treatment.  2. The frequent Limb Movements (PLMS) were associated with sleep disruption.  Consider treating the PLMS primarily if they do not respond. Correlate clinically for a history consistent with regarding restless legs syndrome (RLS).    Consider secondary restless legs syndrome.  Pharmacotherapy may be warranted.  Obtain a serum ferritin level if the clinical history is consistent with RLS.  Consider iron therapy and evaluation for iron deficiency anemia if the serum ferritin level < 50 ng/ml.     3. Advise to lose weight, diet and exercise if not contraindicated (BMI 38.5). 4. Positional therapy to avoid supine sleep is advised. 5. Avoid sedative-hypnotics and alcohol which may worsen sleep apnea (as applicable) and caffeine as it worsens PLMs. Some medications or substances can aggravate RLS and common offenders may include the following:  nicotine, caffeine, SSRIs, TCAs, phenothiazine, dopamine antagonists, diphenhydramine, and alcohol.   6. A follow up appointment will be scheduled in the Sleep Clinic at [facility]. The referring provider will be notified of the results.     I certify that I have reviewed the entire raw data recording prior to the issuance of this report in accordance with the Standards of Accreditation of the American Academy of Sleep Medicine (AASM)   Larey Seat, MD   12-09-2017  Diplomat, American Board of Psychiatry and Neurology  Diplomat, American Board of [place] Director, [employer] [facility] [employer]

## 2017-12-12 ENCOUNTER — Telehealth: Payer: Self-pay | Admitting: Neurology

## 2017-12-12 NOTE — Telephone Encounter (Signed)
-----   Message from Larey Seat, MD sent at 12/09/2017  1:26 PM EST ----- Late REM sleep made overall mild apnea most visible in the morning hours, too late to SPLIT. The patient has OSA and PLMD, moving frequently and having many arousals due to PLMs.  REc return for CPAP titration and to see if PLMs respond as well, CD

## 2017-12-12 NOTE — Telephone Encounter (Signed)

## 2017-12-27 ENCOUNTER — Other Ambulatory Visit: Payer: Self-pay | Admitting: Family Medicine

## 2017-12-27 DIAGNOSIS — F32A Depression, unspecified: Secondary | ICD-10-CM

## 2017-12-27 DIAGNOSIS — F329 Major depressive disorder, single episode, unspecified: Secondary | ICD-10-CM

## 2018-01-10 ENCOUNTER — Ambulatory Visit (INDEPENDENT_AMBULATORY_CARE_PROVIDER_SITE_OTHER): Payer: Medicare Other | Admitting: Neurology

## 2018-01-10 DIAGNOSIS — Z6838 Body mass index (BMI) 38.0-38.9, adult: Secondary | ICD-10-CM

## 2018-01-10 DIAGNOSIS — G4761 Periodic limb movement disorder: Secondary | ICD-10-CM

## 2018-01-10 DIAGNOSIS — G4733 Obstructive sleep apnea (adult) (pediatric): Secondary | ICD-10-CM

## 2018-01-10 DIAGNOSIS — G252 Other specified forms of tremor: Secondary | ICD-10-CM

## 2018-01-10 DIAGNOSIS — E661 Drug-induced obesity: Secondary | ICD-10-CM

## 2018-01-14 NOTE — Addendum Note (Signed)
Addended by: Larey Seat on: 01/14/2018 03:18 PM   Modules accepted: Orders

## 2018-01-14 NOTE — Procedures (Signed)
PATIENT'S NAME:  Melanie Hurst, Melanie Hurst DOB:      Sep 16, 1951      MR#:    [id number]     DATE OF RECORDING: 01/10/2018 REFERRING M.D.:  Jenna Luo, MD Study Performed:   Titration to CPAP HISTORY:  This patient returned for CPAP titration following a PSG performed on 12/07/2017 with AHI of 11.7/h , REM AHI of  17.5/h and severe PLMs. Loud snoring and low SpO2 of 83%. Patient had been using a 67 year old CPAP machine, reported EDS, persistent morbid obesity.  The patient endorsed the Epworth Sleepiness Scale at 17 points.   The patient's weight 269 pounds with a height of 70 (inches), resulting in a BMI of 38.5 kg/m2. The patient's neck circumference measured 15 inches.  CURRENT MEDICATIONS: Cozaar, Provigil, Effexor.  PROCEDURE:  This is a multichannel digital polysomnogram utilizing the SomnoStar 11.2 system.  Electrodes and sensors were applied and monitored per AASM Specifications.   EEG, EOG, Chin and Limb EMG, were sampled at 200 Hz.  ECG, Snore and Nasal Pressure, Thermal Airflow, Respiratory Effort, CPAP Flow and Pressure, Oximetry was sampled at 50 Hz. Digital video and audio were recorded.      CPAP was initiated at 5 cmH20 with heated humidity per AASM split night standards and pressure was advanced to 7/7cmH20 because of hypopneas, apneas and desaturations.  At a PAP pressure of 7 cmH20, there was a reduction of the AHI to 0.0 with 92% sleep efficiency, TST of 142 minutes, and Nadir at 90%. Significant improvement of obstructive sleep apnea.    Lights Out was at 21:06 and Lights On at 05:00. Total recording time (TRT) was 474 minutes, with a total sleep time (TST) of 418 minutes. The patient's sleep latency was 28 minutes with 0.5 minutes of wake time after sleep onset. REM latency was 228 minutes.  The sleep efficiency was high at 88.2 %.    SLEEP ARCHITECTURE: WASO (Wake after sleep onset) was 27.5 minutes.  There were 25.5 minutes in Stage N1, 186 minutes Stage N2, 122 minutes Stage N3 and  84.5 minutes in Stage REM.  The percentage of Stage N1 was 6.1%, Stage N2 was 44.5%, Stage N3 was 29.2% and Stage R (REM sleep) was 20.2%.   RESPIRATORY ANALYSIS:  There was a total of 1 respiratory event: 1 hypopnea with a hypopnea index of 0.1/hour. The patient also had 0 respiratory event related arousals (RERAs).     The total APNEA/HYPOPNEA INDEX (AHI) was 0.1 /hour and the only arousal occurred in NREM. The REM AHI was 0.0 /hour versus a non-REM AHI of 0.2 /hour.  The patient spent 235 minutes of total sleep time in the supine position and 183 minutes in non-supine. The supine AHI was 0.0, versus a non-supine AHI of 0.3.  OXYGEN SATURATION & C02:  The baseline 02 saturation was 94%, with the lowest being 88%. Time spent below 89% saturation equaled 2 minutes.  PERIODIC LIMB MOVEMENTS:   The patient had a total of 7 Periodic Limb Movements. The Periodic Limb Movement (PLM) index was 1.0 and the PLM Arousal index was 0 /hour. The arousals were noted as: 51 were spontaneous, 0 were associated with PLMs, and 0 were associated with respiratory events.  Audio and video analysis did not show any abnormal or unusual movements, behaviors, phonations or vocalizations. No nocturia, but mild Snoring was noted. EKG was in keeping with normal sinus rhythm (NSR). Post-study, the patient indicated that sleep was better than usual.   The  patient was fitted with a Respironics Dream wear mask in small.  DIAGNOSIS 1. Mild Obstructive Sleep Apnea had been confirmed, responded well to low pressure CPAP at 7 cm water, heated humidity, Respironics interface Dream wear in small.  PLANS/RECOMMENDATIONS: 2. OSA with a normal baseline oxygenation in the absence of apnoeic/hypopnoeic events, would proceed with auto-titration capable CPAP machine set at 7 cm water pressure and heated humidity, Respironics interface was a Dream Wear in small size.  a. CPAP clinic follow up in 2-3 months after receiving device; and  thereafter, yearly CPAP clinic follow-up is advisable. b. Weight loss is recommended. c. Avoidance of medications (and alcohol intake) with muscle relaxant properties. d. Avoiding sleeping in the supine position (on one's back). e. Improvement of nasal patency if indicated. f. PLMs resolved under CPAP.   A follow up appointment will be scheduled in the Sleep Clinic at [facility].   Please call [phone] with any questions.      I certify that I have reviewed the entire raw data recording prior to the issuance of this report in accordance with the Standards of Accreditation of the American Academy of Sleep Medicine (AASM)   Larey Seat, M.D.  01-13-2018  Diplomat, American Board of Psychiatry and Neurology  Diplomat, [place] of Sleep Medicine Medical Director of [place] [facility] at [facility]

## 2018-01-16 ENCOUNTER — Encounter: Payer: Self-pay | Admitting: Family Medicine

## 2018-01-16 ENCOUNTER — Telehealth: Payer: Self-pay | Admitting: Neurology

## 2018-01-16 DIAGNOSIS — G4730 Sleep apnea, unspecified: Secondary | ICD-10-CM | POA: Insufficient documentation

## 2018-01-16 NOTE — Telephone Encounter (Signed)
-----   Message from Larey Seat, MD sent at 01/14/2018  3:17 PM EDT ----- DIAGNOSIS 1. Mild Obstructive Sleep Apnea had been confirmed, responded  well to low pressure CPAP at 7 cm water, heated humidity,  Respironics interface Dream wear in small.  PLANS/RECOMMENDATIONS: 2. OSA with a normal baseline oxygenation in the absence of  apnoeic/hypopnoeic events, would proceed with auto-titration  capable CPAP machine set at 7 cm water pressure and heated  humidity, Respironics interface was a Dream Wear in small size.

## 2018-01-16 NOTE — Telephone Encounter (Signed)
I called pt. I advised pt that Dr. Brett Fairy reviewed their sleep study results and found that pt has sleep apnea. Dr. Brett Fairy recommends that pt starts CPAP since she was treated really well with 7 cm of water pressure in her titration test. I reviewed PAP compliance expectations with the pt. Pt is agreeable to starting a CPAP. I advised pt that an order will be sent to a DME, [facility], and [facility] will call the pt within about one week after they file with the pt's insurance. [facility] will show the pt how to use the machine, fit for masks, and troubleshoot the CPAP if needed. A follow up appt was made for insurance purposes with Cecille Rubin, NP on April 12 2018 at 1:15 pm. Pt verbalized understanding to arrive 15 minutes early and bring their CPAP. A letter with all of this information in it will be mailed to the pt as a reminder. I verified with the pt that the address we have on file is correct. Pt verbalized understanding of results. Pt had no questions at this time but was encouraged to call back if questions arise.

## 2018-04-11 ENCOUNTER — Telehealth: Payer: Self-pay | Admitting: *Deleted

## 2018-04-11 NOTE — Telephone Encounter (Signed)
Spoke with patient and advised her to bring her CPAP machine to her follow up tomorrow. She verbalized understanding, agreement.

## 2018-04-11 NOTE — Progress Notes (Signed)
[facility]  PATIENT: Melanie Hurst DOB: June 14, 1951   REASON FOR VISIT: Follow-up for obstructive sleep apnea with initial CPAP HISTORY Melanie Hurst ILLNESS:UPDATE 7/3/2019CM Melanie Hurst, 67 year old female returns for follow-up with obstructive sleep apnea here for initial CPAP compliance.  Melanie Hurst bought her machine online however she was made aware she has to DME company for supplies and make changes to the machine.[facility] is the only DME that will take the patient when they did not purchase the machine from them.  CPAP data dated 03/13/2018-04/11/2018 shows compliance greater than 4 hours at 96.7% pressure 4 to 20 cm.  AHI 5.2 no significant leak.  She returns for reevaluation obstructive sleep apnea with CPAP  1/28/19CDSusan Hurst is a 67 y.o. female , seen here in a referral from Dr. Dennard Schaumann for transfer of OSA/ CPAP care. She uses the some machine for 18 years. She herself indicated she wants to be evaluated for tremor / movement disorder.   Melanie Hurst is a 67 year old right-handed Caucasian female presents here today with a Respironics CPAP machine from the early 2000.  She [place] she had no repeat sleep studies and has been on her first and only CPAP machine ever since.  Over the last 6-7 months she had noted increasing daytime sleepiness-hypersomnolence.  For a while when she had started CPAP she still remained very sleepy and was therefore treated with Provigil.  Her sleepiness improved and her energy level improved as well.  But her blood pressures have been elevated and there is a question how well the machine still treats first apnea, and also her Provigil is no longer as effective.  Provigil can lead to elevated blood pressures, but incomplete sleep apnea care and treatment can also residual hypertension.  The patient reports that she is not snoring and that she carries also a diagnosis  of scoliosis, irritable bowel, depression, hypertension and anemia she is not currently only on Provigil, aspirin, and losartin for blood pressure.  She has also a second concern that she mentioned in our interview.  She had developed a right dominant tremor at rest, often for her barely noticeable until she looks at her hands.  This tremor has exacerbated and gained a higher amplitude over the last 12-18 months and now she has noticed a pill-rolling tremor in her left hand as well. This movement disorder also needs to be addressed, but we will make a follow up visit. The patient has no history of restless legs no symptoms, to her knowledge she does not enact dreams.   Chief complaint according to patient : needs evaluation for recurrent excessive daytime sleepiness while compliantly on CPAP - machine is 67 years old and last sleep study was 19 years ago.   Sleep habits are as follows: Patient usually enters the marital bedroom at around 9 PM she will fall asleep before her husband joins her.  She usually can go to sleep by the promptly  But has difficulties to stay asleep through the night.  She usually has a bathroom break between 2.30 and 3.30 Am, this began about a year ago.  Prior to this she slept through the night without nocturia.  She sleeps on her sides and on her back, supported by one pillow.  He estimates a nocturnal sleep time total of about 6 or 7 hours.  There has been  this long break in sleep in the middle of  the night / early morning for at least 12 months.  She feels that she is able to go back to sleep around 5 or 6 AM, she then has to rise when she just got back to sleep. She sets her alarm for 7 AM and feels " not rested and restored,  I crave more sleep and can't get it ".  Daily naps ( 4 out of 7 days), after lunch time-within 30 minutes after eating.  These last about an hour, and she will sometimes dream.    REVIEW OF SYSTEMS: Full 14 system review of systems performed  and notable only for those listed, all others are neg:  Constitutional: neg  Cardiovascular: neg Ear/Nose/Throat: neg  Skin: neg Eyes: neg Respiratory: neg Gastroitestinal: Urinary frequency Hematology/Lymphatic: neg  Endocrine: neg Musculoskeletal: Back pain Allergy/Immunology: neg Neurological: Tremors Psychiatric: neg Sleep : Obstructive sleep apnea with CPAP  ALLERGIES: Allergies  Allergen Reactions  . Asa [Aspirin] Hives  . Codeine     Vomit     HOME MEDICATIONS: Outpatient Medications Prior to Visit  Medication Sig Dispense Refill  . carbidopa-levodopa (SINEMET IR) 25-100 MG tablet Take 1 tablet by mouth 3 (three) times daily. Take 40 minutes before each meal of the day 90 tablet 5  . losartan (COZAAR) 50 MG tablet Take 1 tablet (50 mg total) by mouth 2 (two) times daily. 180 tablet 3  . modafinil (PROVIGIL) 200 MG tablet Take 1 tablet (200 mg total) by mouth daily. 30 tablet 5  . venlafaxine XR (EFFEXOR-XR) 75 MG 24 hr capsule Take 2 capsules (150 mg total) daily by mouth. 180 capsule 3  . venlafaxine XR (EFFEXOR-XR) 75 MG 24 hr capsule TAKE TWO CAPSULES BY MOUTH DAILY 180 capsule 1   No facility-administered medications prior to visit.     PAST MEDICAL HISTORY: Past Medical History:  Diagnosis Date  . Anemia   . Depression   . Hypertension   . IBS (irritable bowel syndrome)   . Scoliosis   . Sleep apnea    cpap at 7 cm    PAST SURGICAL HISTORY: Past Surgical History:  Procedure Laterality Date  . TONSILECTOMY, ADENOIDECTOMY, BILATERAL MYRINGOTOMY AND TUBES  1962    FAMILY HISTORY: History reviewed. No pertinent family history.  SOCIAL HISTORY: Social History   Socioeconomic History  . Marital status: Single    Spouse name: Not on file  . Number of children: Not on file  . Years of education: Not on file  . Highest education level: Not on file  Occupational History  . Not on file  Social Needs  . Financial resource strain: Not on file  .  Food insecurity:    Worry: Not on file    Inability: Not on file  . Transportation needs:    Medical: Not on file    Non-medical: Not on file  Tobacco Use  . Smoking status: Former Research scientist (life sciences)  . Smokeless tobacco: Never Used  Substance and Sexual Activity  . Alcohol use: No  . Drug use: No  . Sexual activity: Not Currently  Lifestyle  . Physical activity:    Days per week: Not on file    Minutes per session: Not on file  . Stress: Not on file  Relationships  . Social connections:    Talks on phone: Not on file    Gets together: Not on file    Attends religious service: Not on file    Active member of club or organization: Not on file  Attends meetings of clubs or organizations: Not on file    Relationship status: Not on file  . Intimate partner violence:    Fear of current or ex partner: Not on file    Emotionally abused: Not on file    Physically abused: Not on file    Forced sexual activity: Not on file  Other Topics Concern  . Not on file  Social History Narrative  . Not on file     PHYSICAL EXAM  Vitals:   04/12/18 1300  BP: 137/83  Pulse: 77  Weight: 282 lb (127.9 kg)  Height: 5\' 10"  (1.778 m)   Body mass index is 40.46 kg/m.  Generalized: Well developed, in no acute distress  Head: normocephalic and atraumatic,. Oropharynx benign mallopatti 3 Neck: Supple, circumference 15 Musculoskeletal: No deformity   Neurological examination   Mentation: Alert oriented to time, place, history taking. Attention span and concentration appropriate. Recent and remote memory intact.  Follows all commands speech and language fluent.   Cranial nerve II-XII: Pupils were equal round reactive to light extraocular movements were full, visual field were full on confrontational test. Facial sensation and strength were normal. hearing was intact to finger rubbing bilaterally. Uvula tongue midline. head turning and shoulder shrug were normal and symmetric.Tongue protrusion into  cheek strength was normal. Motor: normal bulk and tone, full strength in the BUE, BLE, fine finger movements normal, no pronator drift. No focal weakness Sensory: normal and symmetric to light touch,  Coordination: finger-nose-finger, heel-to-shin bilaterally, no dysmetria Gait and Station: Rising up from seated position without assistance, normal stance,  moderate stride, good arm swing, smooth turning, able to perform tiptoe, and heel walking without difficulty. Tandem gait is steady  DIAGNOSTIC DATA (LABS, IMAGING, TESTING) - I reviewed patient records, labs, notes, testing and imaging myself where available.  Lab Results  Component Value Date   WBC 7.8 04/19/2017   HGB 15.7 (H) 04/19/2017   HCT 47.6 (H) 04/19/2017   MCV 89.6 04/19/2017   PLT 388 04/19/2017      Component Value Date/Time   NA 143 11/07/2017 1419   K 5.0 11/07/2017 1419   CL 104 11/07/2017 1419   CO2 22 11/07/2017 1419   GLUCOSE 87 11/07/2017 1419   GLUCOSE 108 (H) 04/19/2017 1433   BUN 15 11/07/2017 1419   CREATININE 0.86 11/07/2017 1419   CREATININE 1.00 (H) 04/19/2017 1433   CALCIUM 9.3 11/07/2017 1419   PROT 6.9 11/07/2017 1419   ALBUMIN 4.6 11/07/2017 1419   AST 14 11/07/2017 1419   ALT 13 11/07/2017 1419   ALKPHOS 122 (H) 11/07/2017 1419   BILITOT 0.4 11/07/2017 1419   GFRNONAA 71 11/07/2017 1419   GFRNONAA 59 (L) 04/19/2017 1433   GFRAA 81 11/07/2017 1419   GFRAA 68 04/19/2017 1433   Lab Results  Component Value Date   CHOL 215 (H) 08/02/2016   HDL 37 (L) 08/02/2016   LDLCALC 130 (H) 08/02/2016   TRIG 238 (H) 08/02/2016   CHOLHDL 5.8 (H) 08/02/2016    Lab Results  Component Value Date   VITAMINB12 273 06/11/2016   Lab Results  Component Value Date   TSH 1.96 04/19/2017      ASSESSMENT AND PLAN  67 y.o. year old female  With Obstructive sleep apnea here to follow-up for initial CPAP. Data dated 03/13/2018-04/11/2018 shows compliance greater than 4 hours at 96.7% pressure 4 to 20 cm.   AHI 5.2 no significant leak.  PLAN: CPAP compliance 96.7% greater than 4  hours Continue same settings We will do transfer of care to [facility]  DME Follow up 6 months Dennie Bible, [facility], [facility], [facility] [address] [place], [place] [address] [phone]

## 2018-04-12 ENCOUNTER — Encounter: Payer: Self-pay | Admitting: Nurse Practitioner

## 2018-04-12 ENCOUNTER — Ambulatory Visit (INDEPENDENT_AMBULATORY_CARE_PROVIDER_SITE_OTHER): Payer: Medicare Other | Admitting: Nurse Practitioner

## 2018-04-12 VITALS — BP 137/83 | HR 77 | Ht 70.0 in | Wt 282.0 lb

## 2018-04-12 DIAGNOSIS — G4733 Obstructive sleep apnea (adult) (pediatric): Secondary | ICD-10-CM

## 2018-04-12 DIAGNOSIS — Z9989 Dependence on other enabling machines and devices: Secondary | ICD-10-CM | POA: Diagnosis not present

## 2018-04-12 NOTE — Patient Instructions (Signed)
CPAP compliance 96.7% greater than 4 hours Continue same settings We will do transfer of care to [facility]  DME Follow up 6 months

## 2018-04-12 NOTE — Progress Notes (Signed)
All required notes and orders for establishing DME for her CPAP successfully faxed to [facility].

## 2018-05-22 ENCOUNTER — Other Ambulatory Visit: Payer: Self-pay | Admitting: Neurology

## 2018-05-22 DIAGNOSIS — G4719 Other hypersomnia: Secondary | ICD-10-CM

## 2018-06-20 ENCOUNTER — Other Ambulatory Visit: Payer: Self-pay | Admitting: Family Medicine

## 2018-06-20 DIAGNOSIS — F324 Major depressive disorder, single episode, in partial remission: Secondary | ICD-10-CM

## 2018-07-27 ENCOUNTER — Ambulatory Visit (INDEPENDENT_AMBULATORY_CARE_PROVIDER_SITE_OTHER): Payer: Medicare Other | Admitting: Family Medicine

## 2018-07-27 VITALS — BP 140/88 | HR 74 | Temp 98.3°F | Resp 16 | Ht 70.25 in | Wt 277.0 lb

## 2018-07-27 DIAGNOSIS — Z23 Encounter for immunization: Secondary | ICD-10-CM

## 2018-07-27 DIAGNOSIS — K529 Noninfective gastroenteritis and colitis, unspecified: Secondary | ICD-10-CM | POA: Diagnosis not present

## 2018-07-27 NOTE — Progress Notes (Signed)
Subjective:    Patient ID: Melanie Hurst, female    DOB: [birth year], 67 y.o.   MRN: [id number]  HPI Patient states that approximately 2 months ago, she developed diarrhea.  It began after she ate a sandwich at a [employer].  She felt like the let us was dirty and had not been washed appropriately.  Ever since that day, she has had copious daily watery diarrhea.  She denies any melena.  She denies any hematochezia.  She denies any weight loss.  She denies any fevers or chills.  She does occasionally get lower abdominal cramps.  She denies any nausea or vomiting.  She denies any particular food that triggers the diarrhea.  She is tried stopping gluten on her own without benefit..  She denies any recent travel outside the country.  She denies any sick contacts.  She denies any recent antibiotic use.  She denies any exposure to C. difficile.  She has never had a screening colonoscopy Past Medical History:  Diagnosis Date  . Anemia   . Depression   . Hypertension   . IBS (irritable bowel syndrome)   . Scoliosis   . Sleep apnea    cpap at 7 cm   Past Surgical History:  Procedure Laterality Date  . TONSILECTOMY, ADENOIDECTOMY, BILATERAL MYRINGOTOMY AND TUBES  1962   Current Outpatient Medications on File Prior to Visit  Medication Sig Dispense Refill  . carbidopa-levodopa (SINEMET IR) 25-100 MG tablet Take 1 tablet by mouth 3 (three) times daily. Take 40 minutes before each meal of the day (Patient taking differently: Take 1 tablet by mouth as needed. Takes at night to help sleep) 90 tablet 5  . losartan (COZAAR) 50 MG tablet Take 1 tablet (50 mg total) by mouth 2 (two) times daily. 180 tablet 3  . modafinil (PROVIGIL) 200 MG tablet TAKE ONE (1) TABLET BY MOUTH EVERY DAY 30 tablet 5  . venlafaxine XR (EFFEXOR-XR) 75 MG 24 hr capsule TAKE TWO CAPSULES BY MOUTH DAILY 180 capsule 1   No current facility-administered medications on file prior to visit.    Allergies  Allergen Reactions  .  Asa [Aspirin] Hives  . Codeine     Vomit    Social History   Socioeconomic History  . Marital status: Single    Spouse name: Not on file  . Number of children: Not on file  . Years of education: Not on file  . Highest education level: Not on file  Occupational History  . Not on file  Social Needs  . Financial resource strain: Not on file  . Food insecurity:    Worry: Not on file    Inability: Not on file  . Transportation needs:    Medical: Not on file    Non-medical: Not on file  Tobacco Use  . Smoking status: Former Research scientist (life sciences)  . Smokeless tobacco: Never Used  Substance and Sexual Activity  . Alcohol use: No  . Drug use: No  . Sexual activity: Not Currently  Lifestyle  . Physical activity:    Days per week: Not on file    Minutes per session: Not on file  . Stress: Not on file  Relationships  . Social connections:    Talks on phone: Not on file    Gets together: Not on file    Attends religious service: Not on file    Active member of club or organization: Not on file    Attends meetings of clubs or organizations: Not  on file    Relationship status: Not on file  . Intimate partner violence:    Fear of current or ex partner: Not on file    Emotionally abused: Not on file    Physically abused: Not on file    Forced sexual activity: Not on file  Other Topics Concern  . Not on file  Social History Narrative  . Not on file      Review of Systems  All other systems reviewed and are negative.      Objective:   Physical Exam  Constitutional: She is oriented to person, place, and time. She appears well-developed and well-nourished.  Cardiovascular: Normal rate, normal heart sounds and intact distal pulses.  No murmur heard. Pulmonary/Chest: Effort normal and breath sounds normal. No respiratory distress. She has no wheezes.  Abdominal: Soft. Bowel sounds are normal. She exhibits no distension. There is no tenderness. There is no rebound.  Neurological: She is  alert and oriented to person, place, and time. She has normal reflexes. No cranial nerve deficit. She exhibits normal muscle tone. Coordination normal.  Vitals reviewed.         Assessment & Plan:  Chronic diarrhea - Plan: Ova and parasite examination, Stool culture, COMPLETE METABOLIC PANEL WITH GFR, CBC with Differential/Platelet, Sedimentation rate, Ambulatory referral to Gastroenterology  Differential diagnosis includes irritable bowel syndrome, functional diarrhea, infectious diarrhea, inflammatory bowel disease.  I recommended a GI consultation given her age due to the fact she is overdue for colon cancer screening anyway and would be the most cost effective way of screening for inflammatory bowel disease.  I will check a sed rate.  To rule out infectious sources, I will check a stool culture and stool O&P.  I will also check a CBC and a CMP to evaluate for any electrolyte disturbances or bone marrow abnormality stemming from the chronic diarrhea.  Await the results of the above work-up

## 2018-07-27 NOTE — Addendum Note (Signed)
Addended by: Shary Decamp B on: 07/27/2018 04:56 PM   Modules accepted: Orders

## 2018-07-28 ENCOUNTER — Encounter: Payer: Self-pay | Admitting: Gastroenterology

## 2018-07-28 DIAGNOSIS — K529 Noninfective gastroenteritis and colitis, unspecified: Secondary | ICD-10-CM | POA: Diagnosis not present

## 2018-07-28 LAB — CBC WITH DIFFERENTIAL/PLATELET
BASOS ABS: 69 {cells}/uL (ref 0–200)
BASOS PCT: 0.8 %
EOS ABS: 181 {cells}/uL (ref 15–500)
Eosinophils Relative: 2.1 %
HCT: 43.5 % (ref 35.0–45.0)
Hemoglobin: 14.3 g/dL (ref 11.7–15.5)
Lymphs Abs: 2442 cells/uL (ref 850–3900)
MCH: 27.4 pg (ref 27.0–33.0)
MCHC: 32.9 g/dL (ref 32.0–36.0)
MCV: 83.5 fL (ref 80.0–100.0)
MPV: 9.9 fL (ref 7.5–12.5)
Monocytes Relative: 6.7 %
Neutro Abs: 5332 cells/uL (ref 1500–7800)
Neutrophils Relative %: 62 %
PLATELETS: 342 10*3/uL (ref 140–400)
RBC: 5.21 10*6/uL — ABNORMAL HIGH (ref 3.80–5.10)
RDW: 13.5 % (ref 11.0–15.0)
TOTAL LYMPHOCYTE: 28.4 %
WBC: 8.6 10*3/uL (ref 3.8–10.8)
WBCMIX: 576 {cells}/uL (ref 200–950)

## 2018-07-28 LAB — COMPLETE METABOLIC PANEL WITH GFR
AG RATIO: 1.8 (calc) (ref 1.0–2.5)
ALT: 11 U/L (ref 6–29)
AST: 13 U/L (ref 10–35)
Albumin: 4.2 g/dL (ref 3.6–5.1)
Alkaline phosphatase (APISO): 92 U/L (ref 33–130)
BUN: 9 mg/dL (ref 7–25)
CALCIUM: 9.2 mg/dL (ref 8.6–10.4)
CO2: 26 mmol/L (ref 20–32)
Chloride: 102 mmol/L (ref 98–110)
Creat: 0.93 mg/dL (ref 0.50–0.99)
GFR, EST NON AFRICAN AMERICAN: 64 mL/min/{1.73_m2} (ref >=60)
GFR, Est African American: 74 mL/min/{1.73_m2} (ref >=60)
GLOBULIN: 2.3 g/dL (ref 1.9–3.7)
Glucose, Bld: 90 mg/dL (ref 65–99)
POTASSIUM: 4.7 mmol/L (ref 3.5–5.3)
SODIUM: 138 mmol/L (ref 135–146)
TOTAL PROTEIN: 6.5 g/dL (ref 6.1–8.1)
Total Bilirubin: 0.6 mg/dL (ref 0.2–1.2)

## 2018-07-28 LAB — SEDIMENTATION RATE: Sed Rate: 11 mm/h (ref 0–30)

## 2018-07-31 ENCOUNTER — Encounter: Payer: Self-pay | Admitting: *Deleted

## 2018-08-01 ENCOUNTER — Encounter: Payer: Self-pay | Admitting: Family Medicine

## 2018-08-01 LAB — OVA AND PARASITE EXAMINATION
CONCENTRATE RESULT: NONE SEEN
SPECIMEN QUALITY:: ADEQUATE
TRICHROME RESULT:: NONE SEEN
VKL: 91255434

## 2018-08-03 ENCOUNTER — Other Ambulatory Visit: Payer: Self-pay | Admitting: *Deleted

## 2018-08-03 DIAGNOSIS — K529 Noninfective gastroenteritis and colitis, unspecified: Secondary | ICD-10-CM

## 2018-08-10 ENCOUNTER — Other Ambulatory Visit: Payer: Medicare Other

## 2018-08-10 ENCOUNTER — Other Ambulatory Visit: Payer: Self-pay

## 2018-08-10 DIAGNOSIS — K529 Noninfective gastroenteritis and colitis, unspecified: Secondary | ICD-10-CM

## 2018-08-13 LAB — OVA AND PARASITE EXAMINATION
CONCENTRATE RESULT:: NONE SEEN
MICRO NUMBER:: 91311949
SPECIMEN QUALITY: ADEQUATE
TRICHROME RESULT: NONE SEEN

## 2018-08-18 ENCOUNTER — Other Ambulatory Visit: Payer: Medicare Other

## 2018-08-18 DIAGNOSIS — K529 Noninfective gastroenteritis and colitis, unspecified: Secondary | ICD-10-CM | POA: Diagnosis not present

## 2018-08-24 LAB — OVA AND PARASITE EXAMINATION
CONCENTRATE RESULT:: NONE SEEN
SPECIMEN QUALITY:: ADEQUATE
TRICHROME RESULT:: NONE SEEN
VKL: 91348143

## 2018-08-25 ENCOUNTER — Ambulatory Visit (INDEPENDENT_AMBULATORY_CARE_PROVIDER_SITE_OTHER): Payer: Medicare Other | Admitting: Gastroenterology

## 2018-08-25 ENCOUNTER — Encounter: Payer: Self-pay | Admitting: Gastroenterology

## 2018-08-25 VITALS — BP 150/90 | HR 84 | Ht 67.5 in | Wt 272.0 lb

## 2018-08-25 DIAGNOSIS — R1032 Left lower quadrant pain: Secondary | ICD-10-CM | POA: Diagnosis not present

## 2018-08-25 DIAGNOSIS — R140 Abdominal distension (gaseous): Secondary | ICD-10-CM

## 2018-08-25 DIAGNOSIS — K582 Mixed irritable bowel syndrome: Secondary | ICD-10-CM | POA: Diagnosis not present

## 2018-08-25 DIAGNOSIS — R109 Unspecified abdominal pain: Secondary | ICD-10-CM

## 2018-08-25 DIAGNOSIS — R194 Change in bowel habit: Secondary | ICD-10-CM | POA: Diagnosis not present

## 2018-08-25 NOTE — Progress Notes (Signed)
Melanie Hurst    [id number]    01/22/1951  Primary Care Physician:Pickard, Cammie Mcgee, MD  Referring Physician: [name], MD [address] [place], [place] [address]  Chief complaint: IBS, abdominal pain, belching, diarrhea with constipation  HPI: 67 year old female here with complaints of intermittent abdominal pain generalized associated with excessive bloating worse in the past 6 months.  She has alternating constipation with diarrhea.  She has chronic history of irritable bowel syndrome but her bowel habits have worsened in the recent past few months.  Milk or dairy products exacerbate her symptoms Denies any nausea, vomiting, dysphagia, melena or blood in stool.  No loss of appetite or weight loss. Status post  appendectomy and surgery for PCOS Colonoscopy in her 20's for abdominal pain.  No recent colonoscopy for colorectal cancer screening.  Father had colon polyps and paternal aunt diagnosed with colon cancer and breast cancer  Outpatient Encounter Medications as of 08/25/2018  Medication Sig  . carbidopa-levodopa (SINEMET IR) 25-100 MG tablet Take 1 tablet by mouth 3 (three) times daily. Take 40 minutes before each meal of the day (Patient taking differently: Take 1 tablet by mouth as needed. Takes at night to help sleep)  . losartan (COZAAR) 50 MG tablet Take 1 tablet (50 mg total) by mouth 2 (two) times daily.  . modafinil (PROVIGIL) 200 MG tablet TAKE ONE (1) TABLET BY MOUTH EVERY DAY  . venlafaxine XR (EFFEXOR-XR) 75 MG 24 hr capsule TAKE TWO CAPSULES BY MOUTH DAILY   No facility-administered encounter medications on file as of 08/25/2018.     Allergies as of 08/25/2018 - Review Complete 08/25/2018  Allergen Reaction Noted  . Asa [aspirin] Hives 08/19/2017  . Codeine  03/15/2013    Past Medical History:  Diagnosis Date  . Anemia   . Depression   . Fibromyalgia   . Hypertension   . IBS (irritable bowel syndrome)   . Obesity   .  Scoliosis   . Sleep apnea    cpap at 7 cm    Past Surgical History:  Procedure Laterality Date  . PCOS surgery  1971  . TONSILECTOMY, ADENOIDECTOMY, BILATERAL MYRINGOTOMY AND TUBES  1962    Family History  Problem Relation Age of Onset  . Melanoma Mother        wrist  . Other Mother        brain tumor  . Stroke Mother   . Colon polyps Father        not cancerous  . Kidney failure Father   . Breast cancer Sister   . Breast cancer Paternal Aunt   . Colon cancer Paternal Aunt   . Irritable bowel syndrome Paternal Aunt   . Esophageal cancer Neg Hx   . Rectal cancer Neg Hx     Social History   Socioeconomic History  . Marital status: Married    Spouse name: Not on file  . Number of children: 0  . Years of education: Not on file  . Highest education level: Not on file  Occupational History  . Occupation: retired  Scientific laboratory technician  . Financial resource strain: Not on file  . Food insecurity:    Worry: Not on file    Inability: Not on file  . Transportation needs:    Medical: Not on file    Non-medical: Not on file  Tobacco Use  . Smoking status: Former Smoker    Years: 6.00  Types: Cigarettes  . Smokeless tobacco: Never Used  Substance and Sexual Activity  . Alcohol use: No  . Drug use: No  . Sexual activity: Not Currently  Lifestyle  . Physical activity:    Days per week: Not on file    Minutes per session: Not on file  . Stress: Not on file  Relationships  . Social connections:    Talks on phone: Not on file    Gets together: Not on file    Attends religious service: Not on file    Active member of club or organization: Not on file    Attends meetings of clubs or organizations: Not on file    Relationship status: Not on file  . Intimate partner violence:    Fear of current or ex partner: Not on file    Emotionally abused: Not on file    Physically abused: Not on file    Forced sexual activity: Not on file  Other Topics Concern  . Not on file    Social History Narrative  . Not on file      Review of systems: Review of Systems  Constitutional: Negative for fever and chills.  Positive for fatigue HENT: Negative.   Eyes: Negative for blurred vision.  Respiratory: Negative for cough, shortness of breath and wheezing.   Cardiovascular: Negative for chest pain and palpitations.  Gastrointestinal: as per HPI Genitourinary: Negative for dysuria, urgency, frequency and hematuria.  Musculoskeletal: Negative for myalgias, back pain and joint pain.  Skin: Negative for itching and rash.  Neurological: Negative for dizziness, tremors, focal weakness, seizures and loss of consciousness.  Endo/Heme/Allergies: Positive for seasonal allergies.  Psychiatric/Behavioral: Negative for suicidal ideas and hallucinations.  Positive for depression All other systems reviewed and are negative.   Physical Exam: Vitals:   08/25/18 0932  BP: (!) 150/90  Pulse: 84   Body mass index is 41.97 kg/m. Gen:      No acute distress HEENT:  EOMI, sclera anicteric Neck:     No masses; no thyromegaly Lungs:    Clear to auscultation bilaterally; normal respiratory effort CV:         Regular rate and rhythm; no murmurs Abd:      + bowel sounds; soft, non-tender; no palpable masses, no distension Ext:    No edema; adequate peripheral perfusion Skin:      Warm and dry; no rash Neuro: alert and oriented x 3 Psych: normal mood and affect  Data Reviewed:  Reviewed labs, radiology imaging, old records and pertinent past GI work up   Assessment and Plan/Recommendations:  67 year old female here with complaints of recent change in bowel habits, generalized abdominal bloating and discomfort, worse in the left lower quadrant Patient never had a screening for colorectal cancer.  She had colonoscopy in her 54s for evaluation of abdominal pain and was diagnosed with irritable bowel syndrome Discussed lactose-free diet, will do a trial for 2 weeks Start  Benefiber 1 teaspoon 3 times daily Increase water intake to 60 ounces daily Schedule colonoscopy for colorectal cancer screening and evaluation of recent change in bowel habits and abdominal pain The risks and benefits as well as alternatives of endoscopic procedure(s) have been discussed and reviewed. All questions answered.    Damaris Hippo , MD [phone]    CC: [name], MD

## 2018-08-25 NOTE — Patient Instructions (Addendum)
It has been recommended to you by your physician that you have a(n) Colonoscopy completed. Per your request, we did not schedule the procedure(s) today. Please contact our office at [phone] should you decide to have the procedure completed. When you call back you will be scheduled for a pre-op appointment with a nurse to sign your paperwork and get instructions  Start Benefiber 1 tablespoon three times a day with meals  Start lactose free diet for 2 weeks  Follow up in 4 months   Lactose-Free Diet, Adult x 2 weeks If you have lactose intolerance, you are not able to digest lactose. Lactose is a natural sugar found mainly in milk and milk products. You may need to avoid all foods and beverages that contain lactose. A lactose-free diet can help you do this. What do I need to know about this diet?  Do not consume foods, beverages, vitamins, minerals, or medicines with lactose. Read ingredients lists carefully.  Look for the words "lactose-free" on labels.  Use lactase enzyme drops or tablets as directed by your health care provider.  Use lactose-free milk or a milk alternative, such as soy milk, for drinking and cooking.  Make sure you get enough calcium and vitamin D in your diet. A lactose-free eating plan can be lacking in these important nutrients.  Take calcium and vitamin D supplements as directed by your health care provider. Talk to your provider about supplements if you are not able to get enough calcium and vitamin D from food. Which foods have lactose? Lactose is found in:  Milk and foods made from milk.  Yogurt.  Cheese.  Butter.  Margarine.  Sour cream.  Cream.  Whipped toppings and nondairy creamers.  Ice cream and other milk-based desserts.  Lactose is also found in foods or products made with milk or milk ingredients. To find out whether a food contains milk or a milk ingredient, look at the ingredients list. Avoid foods with the statement "May contain  milk" and foods that contain:  Butter.  Cream.  Milk.  Milk solids.  Milk powder.  Whey.  Curd.  Caseinate.  Lactose.  Lactalbumin.  Lactoglobulin.  What are some alternatives to milk and foods made with milk products?  Lactose-free milk.  Soy milk with added calcium and vitamin D.  Almond, coconut, or rice milk with added calcium and vitamin D. Note that these are low in protein.  Soy products, such as soy yogurt, soy cheese, soy ice cream, and soy-based sour cream. Which foods can I eat? Grains Breads and rolls made without milk, such as [place], [place], or [place] bread, bagels, pita, and [employer]. Corn tortillas, corn meal, grits, and polenta. Crackers without lactose or milk solids, such as soda crackers and graham crackers. Cooked or dry cereals without lactose or milk solids. Pasta, quinoa, couscous, barley, oats, bulgur, farro, rice, wild rice, or other grains prepared without milk or lactose. Plain popcorn. Vegetables Fresh, frozen, and canned vegetables without cheese, cream, or butter sauces. Fruits All fresh, canned, frozen, or dried fruits that are not processed with lactose. Meats and Other Protein Sources Plain beef, chicken, fish, [place], lamb, veal, pork, wild game, or ham. Kosher-prepared meat products. Strained or junior meats that do not contain milk. Eggs. Soy meat substitutes. Beans, lentils, and hummus. Tofu. Nuts and seeds. Peanut or other nut butters without lactose. Soups, casseroles, and mixed dishes without cheese, cream, or milk. Dairy Lactose-free milk. Soy, rice, or almond milk with added calcium and vitamin D. Soy  cheese and yogurt. Beverages Carbonated drinks. Tea. Coffee, freeze-dried coffee, and some instant coffees. Fruit and vegetable juices. Condiments Soy sauce. Carob powder. Olives. Gravy made with water. Baker's cocoa. Angie Fava. Pure seasonings and spices. Ketchup. Mustard. Bouillon. Broth. Sweets and Desserts Water and fruit ices.  Gelatin. Cookies, pies, or cakes made from allowed ingredients, such as angel food cake. Pudding made with water or a milk substitute. Lactose-free tofu desserts. Soy, coconut milk, or rice-milk-based frozen desserts. Sugar. Honey. Jam, jelly, and marmalade. Molasses. Pure sugar candy. Dark chocolate without milk. Marshmallows. Fats and Oils Margarines and salad dressings that do not contain milk. Berniece Salines. Vegetable oils. Shortening. Mayonnaise. Soy or coconut-based cream. The items listed above may not be a complete list of recommended foods or beverages. Contact your dietitian for more options. Which foods are not recommended? Grains Breads and rolls that contain milk. Toaster pastries. Muffins, biscuits, waffles, cornbread, and pancakes. These can be prepared at home, commercial, or from mixes. Sweet rolls, donuts, English muffins, fry bread, lefse, flour tortillas with lactose, or [place] toast made with milk or milk ingredients. Crackers that contain lactose. Corn curls. Cooked or dry cereals with lactose. Vegetables Creamed or breaded vegetables. Vegetables in a cheese or butter sauce or with lactose-containing margarines. Instant potatoes. [place] fries. Scalloped or au gratin potatoes. Fruits None. Meats and Other Protein Sources Scrambled eggs, omelets, and souffles that contain milk. Creamed or breaded meat, fish, chicken, or [place]. Sausage products, such as wieners and liver sausage. Cold cuts that contain milk solids. Cheese, cottage cheese, ricotta cheese, and cheese spreads. Lasagna and macaroni and cheese. Pizza. Peanut or other nut butters with added milk solids. Casseroles or mixed dishes containing milk or cheese. Dairy All dairy products, including milk, goat's milk, buttermilk, kefir, acidophilus milk, flavored milk, evaporated milk, condensed milk, dulce de [place], eggnog, yogurt, cheese, and cheese spreads. Beverages Hot chocolate. Cocoa with lactose. Instant iced teas. Powdered  fruit drinks. Smoothies made with milk or yogurt. Condiments Chewing gum that has lactose. Cocoa that has lactose. Spice blends if they contain milk products. Artificial sweeteners that contain lactose. Nondairy creamers. Sweets and Desserts Ice cream, ice milk, gelato, sherbet, and frozen yogurt. Custard, pudding, and mousse. Cake, cream pies, cookies, and other desserts containing milk, cream, cream cheese, or milk chocolate. Pie crust made with milk-containing margarine or butter. Reduced-calorie desserts made with a sugar substitute that contains lactose. Toffee and butterscotch. Milk, white, or dark chocolate that contains milk. Fudge. Caramel. Fats and Oils Margarines and salad dressings that contain milk or cheese. Cream. Half and half. Cream cheese. Sour cream. Chip dips made with sour cream or yogurt. The items listed above may not be a complete list of foods and beverages to avoid. Contact your dietitian for more information. Am I getting enough calcium? Calcium is found in many foods that contain lactose and is important for bone health. The amount of calcium you need depends on your age:  Adults younger than 50 years: 1000 mg of calcium a day.  Adults older than 50 years: 1200 mg of calcium a day.  If you are not getting enough calcium, other calcium sources include:  Orange juice with calcium added. There are 300-350 mg of calcium in 1 cup of orange juice.  Sardines with edible bones. There are 325 mg of calcium in 3 oz of sardines.  Calcium-fortified soy milk. There are 300-400 mg of calcium in 1 cup of calcium-fortified soy milk.  Calcium-fortified rice or almond milk. There are 300  mg of calcium in 1 cup of calcium-fortified rice or almond milk.  Canned salmon with edible bones. There are 180 mg of calcium in 3 oz of canned salmon with edible bones.  Calcium-fortified breakfast cereals. There are [phone] mg of calcium in calcium-fortified breakfast cereals.  Tofu set  with calcium sulfate. There are 250 mg of calcium in  cup of tofu set with calcium sulfate.  Spinach, cooked. There are 145 mg of calcium in  cup of cooked spinach.  Edamame, cooked. There are 130 mg of calcium in  cup of cooked edamame.  Collard greens, cooked. There are 125 mg of calcium in  cup of cooked collard greens.  Kale, frozen or cooked. There are 90 mg of calcium in  cup of cooked or frozen kale.  Almonds. There are 95 mg of calcium in  cup of almonds.  Broccoli, cooked. There are 60 mg of calcium in 1 cup of cooked broccoli.  This information is not intended to replace advice given to you by your health care provider. Make sure you discuss any questions you have with your health care provider. Document Released: 03/19/2002 Document Revised: 03/04/2016 Document Reviewed: 12/28/2013 Elsevier Interactive Patient Education  [employer].  If you are age 66 or older, your body mass index should be between 23-30. Your Body mass index is 41.97 kg/m. If this is out of the aforementioned range listed, please consider follow up with your Primary Care Provider.  If you are age 4 or younger, your body mass index should be between 19-25. Your Body mass index is 41.97 kg/m. If this is out of the aformentioned range listed, please consider follow up with your Primary Care Provider.    Thank you for choosing [facility]  Karleen Hampshire Nandigam,MD

## 2018-08-31 ENCOUNTER — Encounter: Payer: Self-pay | Admitting: Gastroenterology

## 2018-09-20 ENCOUNTER — Encounter: Payer: Self-pay | Admitting: Gastroenterology

## 2018-10-23 ENCOUNTER — Ambulatory Visit (AMBULATORY_SURGERY_CENTER): Payer: Self-pay

## 2018-10-23 VITALS — Ht 68.0 in | Wt 276.4 lb

## 2018-10-23 DIAGNOSIS — R194 Change in bowel habit: Secondary | ICD-10-CM

## 2018-10-23 MED ORDER — NA SULFATE-K SULFATE-MG SULF 17.5-3.13-1.6 GM/177ML PO SOLN: 1.0000 | Freq: Once | ORAL | 0 refills | Status: AC

## 2018-10-23 NOTE — Progress Notes (Signed)
Denies allergies to eggs or soy products. Denies complication of anesthesia or sedation. Denies use of weight loss medication. Denies use of O2.   Emmi instructions declined.  

## 2018-10-30 ENCOUNTER — Ambulatory Visit: Payer: Medicare Other | Admitting: Nurse Practitioner

## 2018-11-06 ENCOUNTER — Encounter: Payer: Self-pay | Admitting: Gastroenterology

## 2018-11-06 ENCOUNTER — Ambulatory Visit (AMBULATORY_SURGERY_CENTER): Payer: Medicare Other | Admitting: Gastroenterology

## 2018-11-06 VITALS — BP 130/78 | HR 62 | Temp 97.8°F | Resp 14 | Ht 68.0 in | Wt 276.0 lb

## 2018-11-06 DIAGNOSIS — D124 Benign neoplasm of descending colon: Secondary | ICD-10-CM

## 2018-11-06 DIAGNOSIS — K449 Diaphragmatic hernia without obstruction or gangrene: Secondary | ICD-10-CM | POA: Diagnosis not present

## 2018-11-06 DIAGNOSIS — D128 Benign neoplasm of rectum: Secondary | ICD-10-CM

## 2018-11-06 DIAGNOSIS — Z1211 Encounter for screening for malignant neoplasm of colon: Secondary | ICD-10-CM | POA: Diagnosis not present

## 2018-11-06 DIAGNOSIS — D127 Benign neoplasm of rectosigmoid junction: Secondary | ICD-10-CM | POA: Diagnosis not present

## 2018-11-06 DIAGNOSIS — D125 Benign neoplasm of sigmoid colon: Secondary | ICD-10-CM

## 2018-11-06 DIAGNOSIS — K2980 Duodenitis without bleeding: Secondary | ICD-10-CM | POA: Diagnosis not present

## 2018-11-06 DIAGNOSIS — R109 Unspecified abdominal pain: Secondary | ICD-10-CM

## 2018-11-06 DIAGNOSIS — D122 Benign neoplasm of ascending colon: Secondary | ICD-10-CM

## 2018-11-06 DIAGNOSIS — R194 Change in bowel habit: Secondary | ICD-10-CM

## 2018-11-06 DIAGNOSIS — K2990 Gastroduodenitis, unspecified, without bleeding: Secondary | ICD-10-CM

## 2018-11-06 DIAGNOSIS — R110 Nausea: Secondary | ICD-10-CM

## 2018-11-06 DIAGNOSIS — K2970 Gastritis, unspecified, without bleeding: Secondary | ICD-10-CM

## 2018-11-06 DIAGNOSIS — K635 Polyp of colon: Secondary | ICD-10-CM

## 2018-11-06 MED ORDER — SODIUM CHLORIDE 0.9 % IV SOLN: 500.0000 mL | Freq: Once | INTRAVENOUS | Status: DC

## 2018-11-06 MED ORDER — OMEPRAZOLE 40 MG PO CPDR: 40.0000 mg | DELAYED_RELEASE_CAPSULE | Freq: Every day | ORAL | 3 refills | Status: DC

## 2018-11-06 NOTE — Patient Instructions (Signed)
Handouts given on hiatal hernia, gastritis, polyps, diverticulosis and hemorrhoids. Take Prilosec 40 mg by mouth first thing when awakening about 30 minutes before your first meal of the day    YOU HAD AN ENDOSCOPIC PROCEDURE TODAY AT [facility]:   Refer to the procedure report that was given to you for any specific questions about what was found during the examination.  If the procedure report does not answer your questions, please call your gastroenterologist to clarify.  If you requested that your care partner not be given the details of your procedure findings, then the procedure report has been included in a sealed envelope for you to review at your convenience later.  YOU SHOULD EXPECT: Some feelings of bloating in the abdomen. Passage of more gas than usual.  Walking can help get rid of the air that was put into your GI tract during the procedure and reduce the bloating. If you had a lower endoscopy (such as a colonoscopy or flexible sigmoidoscopy) you may notice spotting of blood in your stool or on the toilet paper. If you underwent a bowel prep for your procedure, you may not have a normal bowel movement for a few days.  Please Note:  You might notice some irritation and congestion in your nose or some drainage.  This is from the oxygen used during your procedure.  There is no need for concern and it should clear up in a day or so.  SYMPTOMS TO REPORT IMMEDIATELY:   Following lower endoscopy (colonoscopy or flexible sigmoidoscopy):  Excessive amounts of blood in the stool  Significant tenderness or worsening of abdominal pains  Swelling of the abdomen that is new, acute  Fever of 100F or higher   Following upper endoscopy (EGD)  Vomiting of blood or coffee ground material  New chest pain or pain under the shoulder blades  Painful or persistently difficult swallowing  New shortness of breath  Fever of 100F or higher  Black, tarry-looking stools  For urgent or  emergent issues, a gastroenterologist can be reached at any hour by calling [phone].   DIET:  We do recommend a small meal at first, but then you may proceed to your regular diet.  Drink plenty of fluids but you should avoid alcoholic beverages for 24 hours.  ACTIVITY:  You should plan to take it easy for the rest of today and you should NOT DRIVE or use heavy machinery until tomorrow (because of the sedation medicines used during the test).    FOLLOW UP: Our staff will call the number listed on your records the next business day following your procedure to check on you and address any questions or concerns that you may have regarding the information given to you following your procedure. If we do not reach you, we will leave a message.  However, if you are feeling well and you are not experiencing any problems, there is no need to return our call.  We will assume that you have returned to your regular daily activities without incident.  If any biopsies were taken you will be contacted by phone or by letter within the next 1-3 weeks.  Please call us at [phone] if you have not heard about the biopsies in 3 weeks.    SIGNATURES/CONFIDENTIALITY: You and/or your care partner have signed paperwork which will be entered into your electronic medical record.  These signatures attest to the fact that that the information above on your After Visit Summary has  been reviewed and is understood.  Full responsibility of the confidentiality of this discharge information lies with you and/or your care-partner.

## 2018-11-06 NOTE — Progress Notes (Signed)
Pt's states no medical or surgical changes since previsit or office visit. 

## 2018-11-06 NOTE — Progress Notes (Signed)
Called to room to assist during endoscopic procedure.  Patient ID and intended procedure confirmed with present staff. Received instructions for my participation in the procedure from the performing physician.  

## 2018-11-06 NOTE — Op Note (Signed)
[facility] Patient Name: Denetra Formoso Procedure Date: 11/06/2018 8:40 AM MRN: [id number] Endoscopist: Mauri Pole , MD Age: 68 Referring MD:  Date of Birth: [birth year] Gender: Female Account #: [id number] Procedure:                Upper GI endoscopy Indications:              Epigastric abdominal pain, Heartburn Medicines:                Monitored Anesthesia Care Procedure:                Pre-Anesthesia Assessment:                           - Prior to the procedure, a History and Physical                            was performed, and patient medications and                            allergies were reviewed. The patient's tolerance of                            previous anesthesia was also reviewed. The risks                            and benefits of the procedure and the sedation                            options and risks were discussed with the patient.                            All questions were answered, and informed consent                            was obtained. Prior Anticoagulants: The patient has                            taken no previous anticoagulant or antiplatelet                            agents. ASA Grade Assessment: III - A patient with                            severe systemic disease. After reviewing the risks                            and benefits, the patient was deemed in                            satisfactory condition to undergo the procedure.                           After obtaining informed consent, the endoscope was  passed under direct vision. Throughout the                            procedure, the patient's blood pressure, pulse, and                            oxygen saturations were monitored continuously. The                            Endoscope was introduced through the mouth, and                            advanced to the second part of duodenum. The upper                            GI  endoscopy was accomplished without difficulty.                            The patient tolerated the procedure well. Scope In: Scope Out: Findings:                 The Z-line was regular and was found 35 cm from the                            incisors.                           A medium-sized hiatal hernia was present.                           No gross lesions were noted in the entire esophagus.                           Patchy moderate inflammation characterized by                            adherent blood, congestion (edema), erosions,                            erythema and mucus was found in the cardia, in the                            gastric fundus and in the prepyloric region of the                            stomach. Biopsies were taken with a cold forceps                            for Helicobacter pylori testing.                           A few dispersed erosions were found in the cardia.  Duodenitis with few erosions in the bulb otherwise                            the examined duodenum was normal. Complications:            No immediate complications. Estimated Blood Loss:     Estimated blood loss was minimal. Impression:               - Z-line regular, 35 cm from the incisors.                           - Medium-sized hiatal hernia.                           - No gross lesions in esophagus.                           - Gastritis. Biopsied.                           - Gastric erosions.                           - Mild duodenitis Recommendation:           - Patient has a contact number available for                            emergencies. The signs and symptoms of potential                            delayed complications were discussed with the                            patient. Return to normal activities tomorrow.                            Written discharge instructions were provided to the                            patient.                            - Resume previous diet.                           - Continue present medications.                           - Await pathology results.                           - Follow an antireflux regimen indefinitely.                           - Use Prilosec (omeprazole) 40 mg PO daily.                           -  See the other procedure note for documentation of                            additional recommendations. Mauri Pole, MD 11/06/2018 9:54:45 AM This report has been signed electronically.

## 2018-11-06 NOTE — Op Note (Signed)
[name] Procedure Date: 11/06/2018 9:00 AM MRN: [id number] Endoscopist: Mauri Pole , MD Age: 68 Referring MD:  Date of Birth: 08-Oct-1951 Gender: Female Account #: [id number] Procedure:                Colonoscopy Indications:              Screening for colorectal malignant neoplasm Medicines:                Monitored Anesthesia Care Procedure:                Pre-Anesthesia Assessment:                           - Prior to the procedure, a History and Physical                            was performed, and patient medications and                            allergies were reviewed. The patient's tolerance of                            previous anesthesia was also reviewed. The risks                            and benefits of the procedure and the sedation                            options and risks were discussed with the patient.                            All questions were answered, and informed consent                            was obtained. Prior Anticoagulants: The patient has                            taken no previous anticoagulant or antiplatelet                            agents. ASA Grade Assessment: III - A patient with                            severe systemic disease. After reviewing the risks                            and benefits, the patient was deemed in                            satisfactory condition to undergo the procedure.                           - Prior to the procedure, a History and Physical  was performed, and patient medications and                            allergies were reviewed. The patient's tolerance of                            previous anesthesia was also reviewed. The risks                            and benefits of the procedure and the sedation                            options and risks were discussed with the patient.                            All questions were  answered, and informed consent                            was obtained. Prior Anticoagulants: The patient has                            taken no previous anticoagulant or antiplatelet                            agents. ASA Grade Assessment: III - A patient with                            severe systemic disease. After reviewing the risks                            and benefits, the patient was deemed in                            satisfactory condition to undergo the procedure.                           After obtaining informed consent, the colonoscope                            was passed under direct vision. Throughout the                            procedure, the patient's blood pressure, pulse, and                            oxygen saturations were monitored continuously. The                            Colonoscope was introduced through the anus and                            advanced to the the cecum, identified by  appendiceal orifice and ileocecal valve. The                            colonoscopy was performed without difficulty. The                            patient tolerated the procedure well. The quality                            of the bowel preparation was fair. The ileocecal                            valve, appendiceal orifice, and rectum were                            photographed. Scope In: 9:15:00 AM Scope Out: 9:43:32 AM Scope Withdrawal Time: 0 hours 19 minutes 7 seconds  Total Procedure Duration: 0 hours 28 minutes 32 seconds  Findings:                 The perianal and digital rectal examinations were                            normal.                           Two sessile polyps were found in the descending                            colon and ascending colon. The polyps were 1 to 3                            mm in size. These polyps were removed with a cold                            biopsy forceps. Resection and retrieval were                             complete.                           A 18 mm polyp was found in the sigmoid colon. The                            polyp was pedunculated. The polyp was removed with                            a hot snare. Resection and retrieval were complete.                           Two sessile polyps were found in the rectum and                            descending colon. The polyps were 4 to 6 mm in  size. These polyps were removed with a cold snare.                            Resection and retrieval were complete.                           Scattered small-mouthed diverticula were found in                            the sigmoid colon and descending colon.                           Non-bleeding internal hemorrhoids were found during                            retroflexion. The hemorrhoids were medium-sized. Complications:            No immediate complications. Estimated Blood Loss:     Estimated blood loss was minimal. Impression:               - Preparation of the colon was fair.                           - Two 1 to 3 mm polyps in the descending colon and                            in the ascending colon, removed with a cold biopsy                            forceps. Resected and retrieved.                           - One 18 mm polyp in the sigmoid colon, removed                            with a hot snare. Resected and retrieved.                           - Two 4 to 6 mm polyps in the rectum and in the                            descending colon, removed with a cold snare.                            Resected and retrieved.                           - Mild diverticulosis in the sigmoid colon and in                            the descending colon.                           - Non-bleeding internal hemorrhoids. Recommendation:           -  Patient has a contact number available for                            emergencies. The signs and symptoms of potential                             delayed complications were discussed with the                            patient. Return to normal activities tomorrow.                            Written discharge instructions were provided to the                            patient.                           - Resume previous diet.                           - Continue present medications.                           - Await pathology results.                           - Repeat colonoscopy in 3 years for surveillance                            based on pathology results.                           - For future colonoscopy the patient will require                            an extended preparation. If there are any                            questions, please contact the gastroenterologist. Mauri Pole, MD 11/06/2018 10:00:19 AM This report has been signed electronically.

## 2018-11-06 NOTE — Progress Notes (Signed)
PT taken to PACU. Monitors in place. VSS. Report given to RN. 

## 2018-11-07 ENCOUNTER — Telehealth: Payer: Self-pay

## 2018-11-07 ENCOUNTER — Telehealth: Payer: Self-pay | Admitting: *Deleted

## 2018-11-07 NOTE — Telephone Encounter (Signed)
  Follow up Call-  Call back number 11/06/2018  Post procedure Call Back phone  # [phone]  Permission to leave phone message Yes  Some recent data might be hidden     Patient questions:  Do you have a fever, pain , or abdominal swelling? No. Pain Score  0 *  Have you tolerated food without any problems? Yes.    Have you been able to return to your normal activities? Yes.    Do you have any questions about your discharge instructions: Diet   No. Medications  No. Follow up visit  No.  Do you have questions or concerns about your Care? No.  Actions: * If pain score is 4 or above: No action needed, pain <4.

## 2018-11-07 NOTE — Telephone Encounter (Signed)
Left message, will try again later this afternoon.

## 2018-11-08 ENCOUNTER — Other Ambulatory Visit: Payer: Self-pay | Admitting: Neurology

## 2018-11-08 DIAGNOSIS — G4719 Other hypersomnia: Secondary | ICD-10-CM

## 2018-11-13 ENCOUNTER — Encounter: Payer: Self-pay | Admitting: Gastroenterology

## 2018-12-12 ENCOUNTER — Ambulatory Visit (INDEPENDENT_AMBULATORY_CARE_PROVIDER_SITE_OTHER): Payer: Medicare Other | Admitting: Family Medicine

## 2018-12-12 ENCOUNTER — Encounter: Payer: Self-pay | Admitting: Family Medicine

## 2018-12-12 VITALS — BP 130/90 | HR 100 | Temp 98.6°F | Resp 18 | Ht 70.25 in | Wt 280.0 lb

## 2018-12-12 DIAGNOSIS — R799 Abnormal finding of blood chemistry, unspecified: Secondary | ICD-10-CM | POA: Diagnosis not present

## 2018-12-12 DIAGNOSIS — I10 Essential (primary) hypertension: Secondary | ICD-10-CM | POA: Diagnosis not present

## 2018-12-12 DIAGNOSIS — F339 Major depressive disorder, recurrent, unspecified: Secondary | ICD-10-CM | POA: Diagnosis not present

## 2018-12-12 DIAGNOSIS — Z13220 Encounter for screening for lipoid disorders: Secondary | ICD-10-CM

## 2018-12-12 MED ORDER — ARIPIPRAZOLE 5 MG PO TABS: 5.0000 mg | ORAL_TABLET | Freq: Every day | ORAL | 3 refills | Status: DC

## 2018-12-12 MED ORDER — PANTOPRAZOLE SODIUM 40 MG PO TBEC: 40.0000 mg | DELAYED_RELEASE_TABLET | Freq: Every day | ORAL | 3 refills | Status: DC

## 2018-12-12 NOTE — Progress Notes (Signed)
Subjective:    Patient ID: Melanie Hurst, female    DOB: 1951/09/25, 68 y.o.   MRN: [id number]  HPI Patient is a very pleasant 68 year old Caucasian female who has battled depression off and on for many many years.  Prior to becoming a patient here, the patient was treated by another physician and was taking Abilify 5 mg a day in addition to her Effexor.  Patient's previous doctor discontinued Abilify due to weight gain that the patient experienced when she was taking it.  Around that time however her diet changed considerably.  The patient has been buying Abilify over the Internet and paying cash for it without a prescription all these years and has been taking the medication unbeknownst to me.  She states that the medication helps control her anxiety and her depression.  She would like to stay on the medication.  However she is no longer able to buy the medication over the Internet and therefore she discontinued the medication recently.  Since discontinuing the medication, the depression has worsened.  She is here today requesting that we resume the Abilify and she would like a prescription for this.  Her weight has been relatively stable on the medication that she is taking.  Her blood sugar was recently found to be normal.  She is overdue to recheck her cholesterol.  She also has been experiencing nausea recently and saw GI.  Colonoscopy and endoscopy were significant only for a hiatal hernia.  The patient has been taking Pepcid occasionally over-the-counter and seeing some relief of the nausea.  She denies any significant heartburn however chest pain Past Medical History:  Diagnosis Date  . Anemia   . Anxiety   . Depression   . Fibromyalgia   . GERD (gastroesophageal reflux disease)   . Hypertension   . IBS (irritable bowel syndrome)   . Obesity   . Scoliosis   . Sleep apnea    cpap at 7 cm  . Tremor, essential    Past Surgical History:  Procedure Laterality Date  . PCOS surgery  1971    . TONSILECTOMY, ADENOIDECTOMY, BILATERAL MYRINGOTOMY AND TUBES  1962   Current Outpatient Medications on File Prior to Visit  Medication Sig Dispense Refill  . carbidopa-levodopa (SINEMET IR) 25-100 MG tablet Take 1 tablet by mouth 3 (three) times daily. Take 40 minutes before each meal of the day (Patient taking differently: Take 1 tablet by mouth as needed. Takes at night to help sleep) 90 tablet 5  . losartan (COZAAR) 50 MG tablet Take 1 tablet (50 mg total) by mouth 2 (two) times daily. 180 tablet 3  . modafinil (PROVIGIL) 200 MG tablet TAKE 1 TABLET BY MOUTH EVERY DAY 30 tablet 5  . omeprazole (PRILOSEC) 40 MG capsule Take 1 capsule (40 mg total) by mouth daily. 90 capsule 3  . venlafaxine XR (EFFEXOR-XR) 75 MG 24 hr capsule TAKE TWO CAPSULES BY MOUTH DAILY 180 capsule 1   No current facility-administered medications on file prior to visit.    Allergies  Allergen Reactions  . Asa [Aspirin] Hives  . Codeine     Vomit    Social History   Socioeconomic History  . Marital status: Married    Spouse name: Not on file  . Number of children: 0  . Years of education: Not on file  . Highest education level: Not on file  Occupational History  . Occupation: retired  Scientific laboratory technician  . Financial resource strain: Not on file  .  Food insecurity:    Worry: Not on file    Inability: Not on file  . Transportation needs:    Medical: Not on file    Non-medical: Not on file  Tobacco Use  . Smoking status: Former Smoker    Years: 6.00    Types: Cigarettes  . Smokeless tobacco: Never Used  Substance and Sexual Activity  . Alcohol use: Yes    Comment: Occasional wine  . Drug use: No  . Sexual activity: Not Currently  Lifestyle  . Physical activity:    Days per week: Not on file    Minutes per session: Not on file  . Stress: Not on file  Relationships  . Social connections:    Talks on phone: Not on file    Gets together: Not on file    Attends religious service: Not on file     Active member of club or organization: Not on file    Attends meetings of clubs or organizations: Not on file    Relationship status: Not on file  . Intimate partner violence:    Fear of current or ex partner: Not on file    Emotionally abused: Not on file    Physically abused: Not on file    Forced sexual activity: Not on file  Other Topics Concern  . Not on file  Social History Narrative  . Not on file      Review of Systems  All other systems reviewed and are negative.      Objective:   Physical Exam  Constitutional: She is oriented to person, place, and time. She appears well-developed and well-nourished.  Cardiovascular: Normal rate, normal heart sounds and intact distal pulses.  No murmur heard. Pulmonary/Chest: Effort normal and breath sounds normal. No respiratory distress. She has no wheezes.  Abdominal: Soft. Bowel sounds are normal. She exhibits no distension. There is no abdominal tenderness. There is no rebound.  Neurological: She is alert and oriented to person, place, and time. She has normal reflexes. No cranial nerve deficit. She exhibits normal muscle tone. Coordination normal.  Vitals reviewed.         Assessment & Plan:  Screening cholesterol level - Plan: CBC with Differential/Platelet, COMPLETE METABOLIC PANEL WITH GFR, Lipid panel  Depression, recurrent (HCC)  We will resume Abilify 5 mg a day in addition to her Effexor and then reassess the patient in 1 month.  I will also check for cholesterol and blood sugar issues on the medication with a CBC, CMP, and fasting lipid panel.  Monitor for parkinsonism due to the atypical antipsychotic.  Add Protonix 40 mg a day for her hiatal hernia and see if this helps with the nausea that she is experiencing if not, consider gastroparesis

## 2018-12-13 ENCOUNTER — Other Ambulatory Visit: Payer: Self-pay | Admitting: Family Medicine

## 2018-12-13 DIAGNOSIS — F324 Major depressive disorder, single episode, in partial remission: Secondary | ICD-10-CM

## 2018-12-13 LAB — COMPLETE METABOLIC PANEL WITH GFR
AG Ratio: 1.6 (calc) (ref 1.0–2.5)
ALT: 12 U/L (ref 6–29)
AST: 16 U/L (ref 10–35)
Albumin: 4.2 g/dL (ref 3.6–5.1)
Alkaline phosphatase (APISO): 105 U/L (ref 37–153)
BILIRUBIN TOTAL: 0.6 mg/dL (ref 0.2–1.2)
BUN: 14 mg/dL (ref 7–25)
CHLORIDE: 106 mmol/L (ref 98–110)
CO2: 24 mmol/L (ref 20–32)
Calcium: 9.2 mg/dL (ref 8.6–10.4)
Creat: 0.95 mg/dL (ref 0.50–0.99)
GFR, Est African American: 72 mL/min/{1.73_m2} (ref >=60)
GFR, Est Non African American: 62 mL/min/{1.73_m2} (ref >=60)
Globulin: 2.6 g/dL (calc) (ref 1.9–3.7)
Glucose, Bld: 95 mg/dL (ref 65–99)
Potassium: 4.8 mmol/L (ref 3.5–5.3)
Sodium: 141 mmol/L (ref 135–146)
Total Protein: 6.8 g/dL (ref 6.1–8.1)

## 2018-12-13 LAB — CBC WITH DIFFERENTIAL/PLATELET
Absolute Monocytes: 428 cells/uL (ref 200–950)
Basophils Absolute: 60 cells/uL (ref 0–200)
Basophils Relative: 0.8 %
EOS ABS: 150 {cells}/uL (ref 15–500)
Eosinophils Relative: 2 %
HCT: 42.9 % (ref 35.0–45.0)
Hemoglobin: 14.2 g/dL (ref 11.7–15.5)
Lymphs Abs: 2408 cells/uL (ref 850–3900)
MCH: 27.9 pg (ref 27.0–33.0)
MCHC: 33.1 g/dL (ref 32.0–36.0)
MCV: 84.3 fL (ref 80.0–100.0)
MPV: 9.9 fL (ref 7.5–12.5)
Monocytes Relative: 5.7 %
Neutro Abs: 4455 cells/uL (ref 1500–7800)
Neutrophils Relative %: 59.4 %
Platelets: 321 10*3/uL (ref 140–400)
RBC: 5.09 10*6/uL (ref 3.80–5.10)
RDW: 14.1 % (ref 11.0–15.0)
Total Lymphocyte: 32.1 %
WBC: 7.5 10*3/uL (ref 3.8–10.8)

## 2018-12-13 LAB — LIPID PANEL
Cholesterol: 229 mg/dL — ABNORMAL HIGH (ref <200)
HDL: 53 mg/dL (ref >=50)
LDL Cholesterol (Calc): 152 mg/dL (calc) — ABNORMAL HIGH
Non-HDL Cholesterol (Calc): 176 mg/dL (calc) — ABNORMAL HIGH (ref <130)
Total CHOL/HDL Ratio: 4.3 (calc) (ref <5.0)
Triglycerides: 121 mg/dL (ref <150)

## 2019-01-15 ENCOUNTER — Other Ambulatory Visit: Payer: Self-pay | Admitting: Family Medicine

## 2019-01-15 MED FILL — LOSARTAN POTASSIUM 50 MG TA: 50 | 30 days supply | Qty: 60 | Fill #0 | Status: TO

## 2019-01-15 MED FILL — PANTOPRAZOLE SOD DR 40 MG T: 40 | 30 days supply | Qty: 30 | Fill #0 | Status: TO

## 2019-01-15 MED FILL — MODAFINIL 200 MG TABLET: 200 | 30 days supply | Qty: 30 | Fill #0 | Status: TO

## 2019-03-03 ENCOUNTER — Other Ambulatory Visit: Payer: Self-pay | Admitting: Family Medicine

## 2019-03-22 ENCOUNTER — Other Ambulatory Visit: Payer: Self-pay | Admitting: Family Medicine

## 2019-03-27 ENCOUNTER — Other Ambulatory Visit: Payer: Self-pay | Admitting: Family Medicine

## 2019-03-27 DIAGNOSIS — F324 Major depressive disorder, single episode, in partial remission: Secondary | ICD-10-CM

## 2019-03-27 MED ORDER — VENLAFAXINE HCL ER 75 MG PO CP24: 150.0000 mg | ORAL_CAPSULE | Freq: Every day | ORAL | 1 refills | Status: DC

## 2019-04-02 ENCOUNTER — Other Ambulatory Visit: Payer: Self-pay | Admitting: Family Medicine

## 2019-04-02 DIAGNOSIS — F324 Major depressive disorder, single episode, in partial remission: Secondary | ICD-10-CM

## 2019-04-02 MED ORDER — VENLAFAXINE HCL ER 75 MG PO CP24: 150.0000 mg | ORAL_CAPSULE | Freq: Every day | ORAL | 1 refills | Status: DC

## 2019-04-02 MED ORDER — ARIPIPRAZOLE 5 MG PO TABS: 5.0000 mg | ORAL_TABLET | Freq: Every day | ORAL | 3 refills | Status: DC

## 2019-05-14 ENCOUNTER — Other Ambulatory Visit: Payer: Self-pay | Admitting: Neurology

## 2019-05-14 DIAGNOSIS — G4719 Other hypersomnia: Secondary | ICD-10-CM

## 2019-05-15 ENCOUNTER — Other Ambulatory Visit: Payer: Self-pay

## 2019-05-15 ENCOUNTER — Encounter: Payer: Self-pay | Admitting: Family Medicine

## 2019-05-15 ENCOUNTER — Ambulatory Visit (INDEPENDENT_AMBULATORY_CARE_PROVIDER_SITE_OTHER): Payer: Medicare Other | Admitting: Family Medicine

## 2019-05-15 VITALS — BP 146/98 | HR 86 | Temp 99.1°F | Resp 18 | Ht 70.25 in | Wt 291.0 lb

## 2019-05-15 DIAGNOSIS — I10 Essential (primary) hypertension: Secondary | ICD-10-CM

## 2019-05-15 DIAGNOSIS — G4719 Other hypersomnia: Secondary | ICD-10-CM

## 2019-05-15 DIAGNOSIS — R5382 Chronic fatigue, unspecified: Secondary | ICD-10-CM

## 2019-05-15 DIAGNOSIS — E7800 Pure hypercholesterolemia, unspecified: Secondary | ICD-10-CM

## 2019-05-15 MED ORDER — MODAFINIL 200 MG PO TABS: 200.0000 mg | ORAL_TABLET | Freq: Every day | ORAL | 5 refills | Status: DC

## 2019-05-15 MED ORDER — HYDROCHLOROTHIAZIDE 25 MG PO TABS: 25.0000 mg | ORAL_TABLET | Freq: Every day | ORAL | 3 refills | Status: DC

## 2019-05-15 MED ORDER — ATORVASTATIN CALCIUM 20 MG PO TABS: 20.0000 mg | ORAL_TABLET | Freq: Every day | ORAL | 3 refills | Status: DC

## 2019-05-15 NOTE — Progress Notes (Signed)
Subjective:    Patient ID: Melanie Hurst, female    DOB: 1951-07-01, 68 y.o.   MRN: [id number]  HPI  12/2018 Patient is a very pleasant 68 year old Caucasian female who has battled depression off and on for many many years.  Prior to becoming a patient here, the patient was treated by another physician and was taking Abilify 5 mg a day in addition to her Effexor.  Patient's previous doctor discontinued Abilify due to weight gain that the patient experienced when she was taking it.  Around that time however her diet changed considerably.  The patient has been buying Abilify over the Internet and paying cash for it without a prescription all these years and has been taking the medication unbeknownst to me.  She states that the medication helps control her anxiety and her depression.  She would like to stay on the medication.  However she is no longer able to buy the medication over the Internet and therefore she discontinued the medication recently.  Since discontinuing the medication, the depression has worsened.  She is here today requesting that we resume the Abilify and she would like a prescription for this.  Her weight has been relatively stable on the medication that she is taking.  Her blood sugar was recently found to be normal.  She is overdue to recheck her cholesterol.  She also has been experiencing nausea recently and saw GI.  Colonoscopy and endoscopy were significant only for a hiatal hernia.  The patient has been taking Pepcid occasionally over-the-counter and seeing some relief of the nausea.  She denies any significant heartburn however chest pain. At that time, my plan was: We will resume Abilify 5 mg a day in addition to her Effexor and then reassess the patient in 1 month.  I will also check for cholesterol and blood sugar issues on the medication with a CBC, CMP, and fasting lipid panel.  Monitor for parkinsonism due to the atypical antipsychotic.  Add Protonix 40 mg a day for her hiatal  hernia and see if this helps with the nausea that she is experiencing if not, consider gastroparesis  05/15/19 Please see the lab work from March.  At that time her LDL cholesterol was greater than 150.  I recommended Lipitor due to her increased risk of cardiovascular disease as well as her hypertension.  She never started the medication.  Since I last saw the patient, she has also gained 11 pounds on the Abilify.  With that her blood pressure has increased and is now at 146/98.  However she feels much better on the Abilify.  It is substantially helped the depression and she is willing to tolerate the weight gain in order to manage the depression.  She also has a history of chronic fatigue syndrome.  She previously saw neurologist due to the chronic fatigue and excessive daytime sleepiness and was prescribed Provigil.  This is helped the fatigue dramatically.  She is requesting a refill on the Provigil.  I explained about the potential for increased risk of cardiovascular disease on any stimulant medication.  Also explained that I wanted to make sure that her blood pressures well controlled if she takes this medication.  Today her blood pressure is elevated.  She has been taking the losartan 50 mg twice daily.  She is not checking her blood pressure at home.  She denies any chest pain shortness of breath or dyspnea on exertion Past Medical History:  Diagnosis Date  . Anemia   .  Anxiety   . Depression   . Fibromyalgia   . GERD (gastroesophageal reflux disease)   . Hypertension   . IBS (irritable bowel syndrome)   . Obesity   . Scoliosis   . Sleep apnea    cpap at 7 cm  . Tremor, essential    Past Surgical History:  Procedure Laterality Date  . PCOS surgery  1971  . TONSILECTOMY, ADENOIDECTOMY, BILATERAL MYRINGOTOMY AND TUBES  1962   Current Outpatient Medications on File Prior to Visit  Medication Sig Dispense Refill  . ARIPiprazole (ABILIFY) 5 MG tablet Take 1 tablet (5 mg total) by mouth  daily. 90 tablet 3  . losartan (COZAAR) 50 MG tablet TAKE ONE TABLET BY MOUTH TWO TIMES DAILY 180 tablet 3  . pantoprazole (PROTONIX) 40 MG tablet Take 1 tablet by mouth daily 90 tablet 3  . venlafaxine XR (EFFEXOR-XR) 75 MG 24 hr capsule Take 2 capsules (150 mg total) by mouth daily. 180 capsule 1   No current facility-administered medications on file prior to visit.    Allergies  Allergen Reactions  . Asa [Aspirin] Hives  . Codeine     Vomit    Social History   Socioeconomic History  . Marital status: Married    Spouse name: Not on file  . Number of children: 0  . Years of education: Not on file  . Highest education level: Not on file  Occupational History  . Occupation: retired  Scientific laboratory technician  . Financial resource strain: Not on file  . Food insecurity    Worry: Not on file    Inability: Not on file  . Transportation needs    Medical: Not on file    Non-medical: Not on file  Tobacco Use  . Smoking status: Former Smoker    Years: 6.00    Types: Cigarettes  . Smokeless tobacco: Never Used  Substance and Sexual Activity  . Alcohol use: Yes    Comment: Occasional wine  . Drug use: No  . Sexual activity: Not Currently  Lifestyle  . Physical activity    Days per week: Not on file    Minutes per session: Not on file  . Stress: Not on file  Relationships  . Social Herbalist on phone: Not on file    Gets together: Not on file    Attends religious service: Not on file    Active member of club or organization: Not on file    Attends meetings of clubs or organizations: Not on file    Relationship status: Not on file  . Intimate partner violence    Fear of current or ex partner: Not on file    Emotionally abused: Not on file    Physically abused: Not on file    Forced sexual activity: Not on file  Other Topics Concern  . Not on file  Social History Narrative  . Not on file      Review of Systems  All other systems reviewed and are negative.       Objective:   Physical Exam  Constitutional: She is oriented to person, place, and time. She appears well-developed and well-nourished.  Cardiovascular: Normal rate, normal heart sounds and intact distal pulses.  No murmur heard. Pulmonary/Chest: Effort normal and breath sounds normal. No respiratory distress. She has no wheezes.  Abdominal: Soft. Bowel sounds are normal. She exhibits no distension. There is no abdominal tenderness. There is no rebound.  Neurological: She is alert and  oriented to person, place, and time. She has normal reflexes. No cranial nerve deficit. She exhibits normal muscle tone. Coordination normal.  Vitals reviewed.         Assessment & Plan:  1. Pure hypercholesterolemia I will start the patient on Lipitor 20 mg a day with an attempt to try to drive her LDL cholesterol below 100.  I would like to check a CMP and a fasting lipid panel in 3 months - COMPLETE METABOLIC PANEL WITH GFR - Lipid panel  2. Excessive daytime sleepiness Patient is aware of the risk and elects to continue Provigil 200 mg a day for chronic fatigue and excessive daytime sleepiness. - modafinil (PROVIGIL) 200 MG tablet; Take 1 tablet (200 mg total) by mouth daily.  Dispense: 30 tablet; Refill: 5  3. Chronic fatigue Partly due to her depression.  This seems to be improved since taking Abilify.  She has experienced some weight gain on the Abilify but she is willing to accept the side effect as it has helped her depression and her fatigue.  4. Essential hypertension Blood pressure is elevated.  I believe is partly due to the weight gain.  I recommended 10 pounds of weight loss.  Meanwhile start hydrochlorothiazide 25 mg a day recheck fasting lab work in 3 months.

## 2019-06-05 ENCOUNTER — Ambulatory Visit (INDEPENDENT_AMBULATORY_CARE_PROVIDER_SITE_OTHER): Payer: Medicare Other

## 2019-06-05 ENCOUNTER — Other Ambulatory Visit: Payer: Self-pay

## 2019-06-05 DIAGNOSIS — Z23 Encounter for immunization: Secondary | ICD-10-CM | POA: Diagnosis not present

## 2019-10-11 ENCOUNTER — Other Ambulatory Visit: Payer: Self-pay | Admitting: Family Medicine

## 2019-10-15 ENCOUNTER — Other Ambulatory Visit: Payer: Self-pay | Admitting: Family Medicine

## 2019-10-15 DIAGNOSIS — G4719 Other hypersomnia: Secondary | ICD-10-CM

## 2019-10-15 NOTE — Telephone Encounter (Signed)
Ok to refill??  Last office visit/ refill 05/15/2019, #5 refills.

## 2019-11-04 ENCOUNTER — Other Ambulatory Visit: Payer: Self-pay | Admitting: Family Medicine

## 2019-11-09 ENCOUNTER — Ambulatory Visit: Payer: Medicare Other

## 2019-11-14 ENCOUNTER — Ambulatory Visit: Payer: Medicare Other

## 2019-11-17 ENCOUNTER — Ambulatory Visit: Payer: Medicare HMO | Attending: Internal Medicine

## 2019-11-17 DIAGNOSIS — Z23 Encounter for immunization: Secondary | ICD-10-CM | POA: Insufficient documentation

## 2019-11-17 NOTE — Progress Notes (Signed)
   Covid-19 Vaccination Clinic  Name:  Ruqiya Auster    MRN: [id number] DOB: [birth year]  11/17/2019  Ms. Xie was observed post Covid-19 immunization for 15 minutes without incidence. She was provided with Vaccine Information Sheet and instruction to access the V-Safe system.   Ms. Certo was instructed to call 911 with any severe reactions post vaccine: Marland Kitchen Difficulty breathing  . Swelling of your face and throat  . A fast heartbeat  . A bad rash all over your body  . Dizziness and weakness    Immunizations Administered    Name Date Dose VIS Date Route   Pfizer COVID-19 Vaccine 11/17/2019  5:28 PM 0.3 mL 09/21/2019 Intramuscular   Manufacturer: [place]   Lot: [id number]   [place]: [id number]

## 2019-12-05 ENCOUNTER — Other Ambulatory Visit: Payer: Self-pay | Admitting: Family Medicine

## 2019-12-05 DIAGNOSIS — G4719 Other hypersomnia: Secondary | ICD-10-CM

## 2019-12-05 MED ORDER — MODAFINIL 200 MG PO TABS: 200.0000 mg | ORAL_TABLET | Freq: Every day | ORAL | 2 refills | Status: DC

## 2019-12-05 MED ORDER — PANTOPRAZOLE SODIUM 40 MG PO TBEC: 40.0000 mg | DELAYED_RELEASE_TABLET | Freq: Every day | ORAL | 3 refills | Status: DC

## 2019-12-05 MED ORDER — LOSARTAN POTASSIUM 50 MG PO TABS: 50.0000 mg | ORAL_TABLET | Freq: Two times a day (BID) | ORAL | 2 refills | Status: DC

## 2019-12-05 NOTE — Telephone Encounter (Signed)
Patient called requesting a new prescription sent to [phone] fax, for her modafinil she is now using mail order.  CB# [phone]

## 2019-12-05 NOTE — Telephone Encounter (Signed)
Can you please send rx - it is considered controlled.

## 2019-12-06 MED ORDER — MODAFINIL 200 MG PO TABS: 200.0000 mg | ORAL_TABLET | Freq: Every day | ORAL | 2 refills | Status: DC

## 2019-12-12 ENCOUNTER — Ambulatory Visit: Payer: Medicare HMO | Attending: Internal Medicine

## 2019-12-12 ENCOUNTER — Other Ambulatory Visit: Payer: Self-pay | Admitting: Family Medicine

## 2019-12-12 ENCOUNTER — Telehealth: Payer: Self-pay | Admitting: Family Medicine

## 2019-12-12 DIAGNOSIS — Z23 Encounter for immunization: Secondary | ICD-10-CM | POA: Insufficient documentation

## 2019-12-12 DIAGNOSIS — F324 Major depressive disorder, single episode, in partial remission: Secondary | ICD-10-CM

## 2019-12-12 MED ORDER — VENLAFAXINE HCL ER 75 MG PO CP24: 150.0000 mg | ORAL_CAPSULE | Freq: Every day | ORAL | 1 refills | Status: DC

## 2019-12-12 NOTE — Telephone Encounter (Signed)
Approved through ins - pharmacy made aware

## 2019-12-12 NOTE — Telephone Encounter (Signed)
PA Submitted through [email or web address] and received the following:  Your information has been submitted to [employer]. [employer] will review the request and will issue a decision, typically within 1-3 days from your submission. You can check the updated outcome later by reopening this request.  If [employer] has not responded in 1-3 days or if you have any questions about your ePA request, please contact [employer] at [phone]. If you think there may be a problem with your PA request, use our live chat feature at the bottom right. 

## 2019-12-12 NOTE — Progress Notes (Signed)
   Covid-19 Vaccination Clinic  Name:  Melanie Hurst    MRN: [id number] DOB: 11/13/1950  12/12/2019  [name] was observed post Covid-19 immunization for 15 minutes without incident. She was provided with Vaccine Information Sheet and instruction to access the V-Safe system.   [name] was instructed to call 911 with any severe reactions post vaccine: Marland Kitchen Difficulty breathing  . Swelling of face and throat  . A fast heartbeat  . A bad rash all over body  . Dizziness and weakness   Immunizations Administered    Name Date Dose VIS Date Route   Pfizer COVID-19 Vaccine 12/12/2019  2:30 PM 0.3 mL 09/21/2019 Intramuscular   Manufacturer: [place]   Lot: [id number]   [place]: [id number]

## 2019-12-27 ENCOUNTER — Other Ambulatory Visit: Payer: Self-pay | Admitting: Family Medicine

## 2019-12-27 DIAGNOSIS — G4719 Other hypersomnia: Secondary | ICD-10-CM

## 2019-12-27 MED ORDER — MODAFINIL 200 MG PO TABS: 200.0000 mg | ORAL_TABLET | Freq: Every day | ORAL | 2 refills | Status: DC

## 2019-12-27 NOTE — Telephone Encounter (Signed)
Needs refill on Provigil (Pharmacy states that they did not get previous rx)

## 2019-12-28 MED ORDER — MODAFINIL 200 MG PO TABS: 200.0000 mg | ORAL_TABLET | Freq: Every day | ORAL | 2 refills | Status: DC

## 2020-01-12 ENCOUNTER — Other Ambulatory Visit: Payer: Self-pay | Admitting: Family Medicine

## 2020-01-24 ENCOUNTER — Other Ambulatory Visit: Payer: Self-pay | Admitting: Family Medicine

## 2020-01-24 DIAGNOSIS — F324 Major depressive disorder, single episode, in partial remission: Secondary | ICD-10-CM

## 2020-01-24 MED ORDER — VENLAFAXINE HCL ER 75 MG PO CP24: 75.0000 mg | ORAL_CAPSULE | Freq: Two times a day (BID) | ORAL | 1 refills | Status: DC

## 2020-03-05 ENCOUNTER — Ambulatory Visit: Payer: Medicare HMO | Attending: Internal Medicine

## 2020-03-05 DIAGNOSIS — Z20822 Contact with and (suspected) exposure to covid-19: Secondary | ICD-10-CM | POA: Diagnosis not present

## 2020-03-06 LAB — SARS-COV-2, NAA 2 DAY TAT

## 2020-03-06 LAB — NOVEL CORONAVIRUS, NAA: SARS-CoV-2, NAA: NOT DETECTED

## 2020-04-01 ENCOUNTER — Ambulatory Visit (INDEPENDENT_AMBULATORY_CARE_PROVIDER_SITE_OTHER): Payer: Medicare HMO | Admitting: Nurse Practitioner

## 2020-04-01 ENCOUNTER — Other Ambulatory Visit: Payer: Self-pay

## 2020-04-01 ENCOUNTER — Encounter: Payer: Self-pay | Admitting: Nurse Practitioner

## 2020-04-01 ENCOUNTER — Other Ambulatory Visit: Payer: Self-pay | Admitting: Nurse Practitioner

## 2020-04-01 ENCOUNTER — Ambulatory Visit
Admission: RE | Admit: 2020-04-01 | Discharge: 2020-04-01 | Disposition: A | Discharge status: Home / Self Care | Payer: Medicare HMO | Source: Ambulatory Visit | Attending: Nurse Practitioner | Admitting: Nurse Practitioner

## 2020-04-01 VITALS — BP 130/84 | HR 113 | Temp 97.6°F | Resp 22 | Wt 287.2 lb

## 2020-04-01 DIAGNOSIS — R109 Unspecified abdominal pain: Secondary | ICD-10-CM

## 2020-04-01 DIAGNOSIS — R0602 Shortness of breath: Secondary | ICD-10-CM

## 2020-04-01 LAB — URINALYSIS, ROUTINE W REFLEX MICROSCOPIC
Bilirubin Urine: NEGATIVE
Glucose, UA: NEGATIVE
Hgb urine dipstick: NEGATIVE
Nitrite: NEGATIVE
Specific Gravity, Urine: 1.02 (ref 1.001–1.03)
pH: 5.5 (ref 5.0–8.0)

## 2020-04-01 LAB — MICROSCOPIC MESSAGE

## 2020-04-01 MED ORDER — ALBUTEROL SULFATE HFA 108 (90 BASE) MCG/ACT IN AERS: 2.0000 | INHALATION_SPRAY | Freq: Four times a day (QID) | RESPIRATORY_TRACT | 3 refills | Status: DC | PRN

## 2020-04-01 NOTE — Progress Notes (Signed)
Established Patient Office Visit  Subjective:  Patient ID: Melanie Hurst, female    DOB: 03/10/1951  Age: 69 y.o. MRN: [id number]  CC:  Chief Complaint  Patient presents with  . Flank Pain    L side, started x1 week, no other sxs, tylenol was taken    HPI Melanie Hurst is a 69 year old female presenting to clinic for discomfort that comes and goes to her upper left abdominal quadrant. She denied gu/gi sxs such as change in bowel habits, appetite, dietary changes, n/v/d. She reports h/o diverticulosis never having diverticulitis and denies lower abdominal pain. The discomfort she says started after two weeks of coughing. She recalls become tender to the affected area. She feels this could be inflammation. She has tried no treatment. She denied cp, ct, gu, gi sxs, other pain , new sob, palpation, edema, recent known injury/falls, no uti sxs urinary burning, urgency, hesitancy, pressure.   The pt is short of breath walking in from parking lot to the treatment room for her appt. When inquiring, she reports this is chronic that her pcp has worked her up for sob on exertion with what she thinks is PFT a, walk test and all is normal. She denied having albuterol inhaler. Discussed this as a treatment for SOB on exertion to use as needed in interim of pcp work up, discussed risk vs benefit and importance of pcp follow up and pt verbalized u/s and desires to proceed.    Past Medical History:  Diagnosis Date  . Anemia   . Anxiety   . Depression   . Fibromyalgia   . GERD (gastroesophageal reflux disease)   . Hypertension   . IBS (irritable bowel syndrome)   . Obesity   . Scoliosis   . Sleep apnea    cpap at 7 cm  . Tremor, essential     Past Surgical History:  Procedure Laterality Date  . PCOS surgery  1971  . TONSILECTOMY, ADENOIDECTOMY, BILATERAL MYRINGOTOMY AND TUBES  1962    Family History  Problem Relation Age of Onset  . Melanoma Mother        wrist  . Other Mother         brain tumor  . Stroke Mother   . Colon polyps Father        not cancerous  . Kidney failure Father   . Breast cancer Sister   . Breast cancer Paternal Aunt   . Colon cancer Paternal Aunt   . Irritable bowel syndrome Paternal Aunt   . Stomach cancer Maternal Uncle   . Esophageal cancer Neg Hx   . Rectal cancer Neg Hx     Social History   Socioeconomic History  . Marital status: Married    Spouse name: Not on file  . Number of children: 0  . Years of education: Not on file  . Highest education level: Not on file  Occupational History  . Occupation: retired  Tobacco Use  . Smoking status: Former Smoker    Years: 6.00    Types: Cigarettes  . Smokeless tobacco: Never Used  Vaping Use  . Vaping Use: Former  Substance and Sexual Activity  . Alcohol use: Yes    Comment: Occasional wine  . Drug use: No  . Sexual activity: Not Currently  Other Topics Concern  . Not on file  Social History Narrative  . Not on file   Social Determinants of Health   Financial Resource Strain:   . Difficulty of  Paying Living Expenses:   Food Insecurity:   . Worried About Charity fundraiser in the Last Year:   . Arboriculturist in the Last Year:   Transportation Needs:   . Film/video editor (Medical):   Marland Kitchen Lack of Transportation (Non-Medical):   Physical Activity:   . Days of Exercise per Week:   . Minutes of Exercise per Session:   Stress:   . Feeling of Stress :   Social Connections:   . Frequency of Communication with Friends and Family:   . Frequency of Social Gatherings with Friends and Family:   . Attends Religious Services:   . Active Member of Clubs or Organizations:   . Attends Archivist Meetings:   Marland Kitchen Marital Status:   Intimate Partner Violence:   . Fear of Current or Ex-Partner:   . Emotionally Abused:   Marland Kitchen Physically Abused:   . Sexually Abused:     Outpatient Medications Prior to Visit  Medication Sig Dispense Refill  . ARIPiprazole (ABILIFY) 5 MG  tablet Take 1 tablet (5 mg total) by mouth daily. 90 tablet 2  . losartan (COZAAR) 50 MG tablet Take 1 tablet (50 mg total) by mouth 2 (two) times daily. 180 tablet 2  . modafinil (PROVIGIL) 200 MG tablet Take 1 tablet (200 mg total) by mouth daily. 90 tablet 2  . pantoprazole (PROTONIX) 40 MG tablet Take 1 tablet (40 mg total) by mouth daily. 90 tablet 3  . venlafaxine XR (EFFEXOR-XR) 75 MG 24 hr capsule Take 1 capsule (75 mg total) by mouth in the morning and at bedtime. 180 capsule 1  . atorvastatin (LIPITOR) 20 MG tablet Take 1 tablet (20 mg total) by mouth daily. (Patient not taking: Reported on 04/01/2020) 90 tablet 3  . hydrochlorothiazide (HYDRODIURIL) 25 MG tablet Take one tablet by mouth daily (Patient not taking: Reported on 04/01/2020) 90 tablet 1   No facility-administered medications prior to visit.    Allergies  Allergen Reactions  . Asa [Aspirin] Hives  . Codeine     Vomit     ROS Review of Systems  All other systems reviewed and are negative.     Objective:    Physical Exam Vitals and nursing note reviewed.  Constitutional:      Appearance: Normal appearance.  HENT:     Head: Normocephalic.     Right Ear: External ear normal.     Left Ear: External ear normal.  Cardiovascular:     Rate and Rhythm: Normal rate.  Pulmonary:     Effort: Pulmonary effort is normal.  Abdominal:     General: Bowel sounds are normal. There is no distension or abdominal bruit.     Palpations: There is no shifting dullness, fluid wave, hepatomegaly or splenomegaly.     Tenderness: There is no abdominal tenderness. There is no right CVA tenderness, left CVA tenderness, guarding or rebound. Negative signs include Murphy's sign, Rovsing's sign, McBurney's sign, psoas sign and obturator sign.     Hernia: No hernia is present.  Musculoskeletal:     Cervical back: Normal range of motion.  Skin:    General: Skin is warm.  Neurological:     General: No focal deficit present.     Mental  Status: She is alert and oriented to person, place, and time.     BP 130/84 (BP Location: Left Arm, Patient Position: Sitting, Cuff Size: Large)   Pulse (!) 113   Temp 97.6 F (36.4 C) (  Temporal)   Resp (!) 22   Wt 287 lb 3.2 oz (130.3 kg)   SpO2 97%   BMI 40.92 kg/m  Wt Readings from Last 3 Encounters:  04/01/20 287 lb 3.2 oz (130.3 kg)  05/15/19 291 lb (132 kg)  12/12/18 280 lb (127 kg)     Health Maintenance Due  Topic Date Due  . Hepatitis C Screening  Never done  . TETANUS/TDAP  Never done  . MAMMOGRAM  Never done  . DEXA SCAN  Never done  . PNA vac Low Risk Adult (1 of 2 - PCV13) Never done    There are no preventive care reminders to display for this patient.  Lab Results  Component Value Date   TSH 1.96 04/19/2017   Lab Results  Component Value Date   WBC 7.5 12/12/2018   HGB 14.2 12/12/2018   HCT 42.9 12/12/2018   MCV 84.3 12/12/2018   PLT 321 12/12/2018   Lab Results  Component Value Date   NA 141 12/12/2018   K 4.8 12/12/2018   CO2 24 12/12/2018   GLUCOSE 95 12/12/2018   BUN 14 12/12/2018   CREATININE 0.95 12/12/2018   BILITOT 0.6 12/12/2018   ALKPHOS 122 (H) 11/07/2017   AST 16 12/12/2018   ALT 12 12/12/2018   PROT 6.8 12/12/2018   ALBUMIN 4.6 11/07/2017   CALCIUM 9.2 12/12/2018   Lab Results  Component Value Date   CHOL 229 (H) 12/12/2018   Lab Results  Component Value Date   HDL 53 12/12/2018   Lab Results  Component Value Date   LDLCALC 152 (H) 12/12/2018   Lab Results  Component Value Date   TRIG 121 12/12/2018   Lab Results  Component Value Date   CHOLHDL 4.3 12/12/2018   No results found for: HGBA1C    Assessment & Plan:   Problem List Items Addressed This Visit    None    Visit Diagnoses    Left sided abdominal pain    -  Primary   Relevant Orders   Urinalysis, Routine w reflex microscopic   COMPLETE METABOLIC PANEL WITH GFR   CBC with Differential/Platelet   Amylase   Lipase   DG Abd 1 View   Short of  breath on exertion       Relevant Medications   albuterol (VENTOLIN HFA) 108 (90 Base) MCG/ACT inhaler    For left upper abdominal pain, U/A collected in clinic today will call with results, Please complete KUB xray image today, Complete labs today, will call with results. May take acetaminophen for discomfort as you eluded to possibility of pleurisy after coughing for 2 weeks ending last week. Treatment plan if all studies are negative is to follow up in clinic tomorrow for continued sxs. For sudden worsening sxs seek urgent medical attention.  For the shortness of breath on exertion that you reported that has been ongoing for a while and worked up by your PCP I will prescribe Albuterol inhaler. Please follow up as planned with your PCP.   Meds ordered this encounter  Medications  . albuterol (VENTOLIN HFA) 108 (90 Base) MCG/ACT inhaler    Sig: Inhale 2 puffs into the lungs every 6 (six) hours as needed for wheezing or shortness of breath.    Dispense:  18 g    Refill:  3    Follow-up: Return if symptoms worsen or fail to improve.    Annie Main, FNP

## 2020-04-01 NOTE — Patient Instructions (Addendum)
For left upper abdominal pain, U/A collected in clinic today will call with results, Please complete KUB xray image today, Complete labs today, will call with results. May take acetaminophen for discomfort as you eluded to possibility of pleurisy after coughing for 2 weeks ending last week. Treatment plan if all studies are negative is to follow up in clinic tomorrow for continued sxs. For sudden worsening sxs seek urgent medical attention.  For the shortness of breath on exertion that you reported that has been ongoing for a while and worked up by your PCP I will prescribe Albuterol inhaler. Please follow up as planned with your PCP.

## 2020-04-02 ENCOUNTER — Other Ambulatory Visit: Payer: Self-pay

## 2020-04-02 ENCOUNTER — Other Ambulatory Visit: Payer: Self-pay | Admitting: Nurse Practitioner

## 2020-04-02 DIAGNOSIS — N390 Urinary tract infection, site not specified: Secondary | ICD-10-CM

## 2020-04-02 DIAGNOSIS — R82998 Other abnormal findings in urine: Secondary | ICD-10-CM

## 2020-04-02 DIAGNOSIS — R319 Hematuria, unspecified: Secondary | ICD-10-CM

## 2020-04-02 LAB — COMPLETE METABOLIC PANEL WITH GFR
AG Ratio: 1.7 (calc) (ref 1.0–2.5)
ALT: 10 U/L (ref 6–29)
AST: 13 U/L (ref 10–35)
Albumin: 4.7 g/dL (ref 3.6–5.1)
Alkaline phosphatase (APISO): 131 U/L (ref 37–153)
BUN/Creatinine Ratio: 16 (calc) (ref 6–22)
BUN: 16 mg/dL (ref 7–25)
CO2: 23 mmol/L (ref 20–32)
Calcium: 10.8 mg/dL — ABNORMAL HIGH (ref 8.6–10.4)
Chloride: 102 mmol/L (ref 98–110)
Creat: 1.03 mg/dL — ABNORMAL HIGH (ref 0.50–0.99)
GFR, Est African American: 65 mL/min/{1.73_m2} (ref >=60)
GFR, Est Non African American: 56 mL/min/{1.73_m2} — ABNORMAL LOW (ref >=60)
Globulin: 2.8 g/dL (calc) (ref 1.9–3.7)
Glucose, Bld: 128 mg/dL — ABNORMAL HIGH (ref 65–99)
Potassium: 4.9 mmol/L (ref 3.5–5.3)
Sodium: 142 mmol/L (ref 135–146)
Total Bilirubin: 0.6 mg/dL (ref 0.2–1.2)
Total Protein: 7.5 g/dL (ref 6.1–8.1)

## 2020-04-02 LAB — CBC WITH DIFFERENTIAL/PLATELET
Absolute Monocytes: 712 cells/uL (ref 200–950)
Basophils Absolute: 113 cells/uL (ref 0–200)
Basophils Relative: 1 %
Eosinophils Absolute: 192 cells/uL (ref 15–500)
Eosinophils Relative: 1.7 %
HCT: 47 % — ABNORMAL HIGH (ref 35.0–45.0)
Hemoglobin: 14.8 g/dL (ref 11.7–15.5)
Lymphs Abs: 2972 cells/uL (ref 850–3900)
MCH: 27.1 pg (ref 27.0–33.0)
MCHC: 31.5 g/dL — ABNORMAL LOW (ref 32.0–36.0)
MCV: 85.9 fL (ref 80.0–100.0)
MPV: 9.7 fL (ref 7.5–12.5)
Monocytes Relative: 6.3 %
Neutro Abs: 7311 cells/uL (ref 1500–7800)
Neutrophils Relative %: 64.7 %
Platelets: 448 10*3/uL — ABNORMAL HIGH (ref 140–400)
RBC: 5.47 10*6/uL — ABNORMAL HIGH (ref 3.80–5.10)
RDW: 14.2 % (ref 11.0–15.0)
Total Lymphocyte: 26.3 %
WBC: 11.3 10*3/uL — ABNORMAL HIGH (ref 3.8–10.8)

## 2020-04-02 LAB — LIPASE: Lipase: 27 U/L (ref 7–60)

## 2020-04-02 LAB — AMYLASE: Amylase: 36 U/L (ref 21–101)

## 2020-04-02 MED ORDER — CIPROFLOXACIN HCL 500 MG PO TABS: 500.0000 mg | ORAL_TABLET | Freq: Two times a day (BID) | ORAL | 0 refills | Status: DC

## 2020-04-02 MED ORDER — CIPROFLOXACIN HCL 500 MG PO TABS: 500.0000 mg | ORAL_TABLET | Freq: Two times a day (BID) | ORAL | 0 refills | Status: AC

## 2020-04-02 NOTE — Progress Notes (Signed)
I have received urine and blood /lab results, not image results. With the results I have and her luq discomfort as discussed with her yesterday this certainly could be deferred CVA discomfort indicating UTI infection and her labs would support that however I would like to consider kidney stone as well please ask if she has completed the KUB. I will go ahead and prescribe antibiotic to cover infection.

## 2020-04-03 ENCOUNTER — Other Ambulatory Visit: Payer: Self-pay | Admitting: Nurse Practitioner

## 2020-04-03 DIAGNOSIS — R1012 Left upper quadrant pain: Secondary | ICD-10-CM

## 2020-04-03 DIAGNOSIS — R109 Unspecified abdominal pain: Secondary | ICD-10-CM

## 2020-04-03 DIAGNOSIS — R82998 Other abnormal findings in urine: Secondary | ICD-10-CM

## 2020-04-03 DIAGNOSIS — N200 Calculus of kidney: Secondary | ICD-10-CM

## 2020-04-03 MED ORDER — TAMSULOSIN HCL 0.4 MG PO CAPS: 0.4000 mg | ORAL_CAPSULE | Freq: Every day | ORAL | 0 refills | Status: DC

## 2020-04-03 NOTE — Progress Notes (Signed)
CT of abdomen RX for Flomax Referral to Nephology I believe she has kidney stone, also continue the antibiotic Pain medication if needed let me know if she needs more than over the counter please.

## 2020-04-07 ENCOUNTER — Telehealth: Payer: Self-pay | Admitting: Family Medicine

## 2020-04-07 NOTE — Telephone Encounter (Signed)
See note

## 2020-04-07 NOTE — Telephone Encounter (Signed)
Patient states that her breathing is getting worse and would like to see a pulmonologist  Dr. Hillard Danker at [facility]. She sounded winded when I was given her a referral appointment for a CT.   CB# [phone]

## 2020-04-08 ENCOUNTER — Other Ambulatory Visit: Payer: Self-pay | Admitting: Nurse Practitioner

## 2020-04-08 NOTE — Telephone Encounter (Signed)
Please call her to go to ER.

## 2020-04-08 NOTE — Telephone Encounter (Signed)
Lvm on pt's phone of plan. 

## 2020-04-25 ENCOUNTER — Other Ambulatory Visit: Payer: Self-pay

## 2020-04-25 ENCOUNTER — Ambulatory Visit (HOSPITAL_COMMUNITY)
Admission: RE | Admit: 2020-04-25 | Discharge: 2020-04-25 | Disposition: A | Discharge status: Home / Self Care | Payer: Medicare HMO | Source: Ambulatory Visit | Attending: Nurse Practitioner | Admitting: Nurse Practitioner

## 2020-04-25 DIAGNOSIS — K449 Diaphragmatic hernia without obstruction or gangrene: Secondary | ICD-10-CM | POA: Diagnosis not present

## 2020-04-25 DIAGNOSIS — R1012 Left upper quadrant pain: Secondary | ICD-10-CM | POA: Insufficient documentation

## 2020-04-25 DIAGNOSIS — K429 Umbilical hernia without obstruction or gangrene: Secondary | ICD-10-CM | POA: Diagnosis not present

## 2020-04-25 DIAGNOSIS — I700 Atherosclerosis of aorta: Secondary | ICD-10-CM | POA: Diagnosis not present

## 2020-04-25 DIAGNOSIS — M4186 Other forms of scoliosis, lumbar region: Secondary | ICD-10-CM | POA: Diagnosis not present

## 2020-04-28 ENCOUNTER — Other Ambulatory Visit: Payer: Self-pay

## 2020-04-28 ENCOUNTER — Other Ambulatory Visit: Payer: Self-pay | Admitting: Nurse Practitioner

## 2020-04-28 DIAGNOSIS — K429 Umbilical hernia without obstruction or gangrene: Secondary | ICD-10-CM | POA: Insufficient documentation

## 2020-04-28 DIAGNOSIS — M4180 Other forms of scoliosis, site unspecified: Secondary | ICD-10-CM

## 2020-04-28 DIAGNOSIS — K449 Diaphragmatic hernia without obstruction or gangrene: Secondary | ICD-10-CM

## 2020-04-28 NOTE — Progress Notes (Signed)
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   Referral to general surgery for possible repair of hernias.

## 2020-05-30 ENCOUNTER — Other Ambulatory Visit: Payer: Self-pay

## 2020-05-30 ENCOUNTER — Ambulatory Visit (INDEPENDENT_AMBULATORY_CARE_PROVIDER_SITE_OTHER): Payer: Medicare HMO | Admitting: Family Medicine

## 2020-05-30 ENCOUNTER — Encounter: Payer: Self-pay | Admitting: Family Medicine

## 2020-05-30 DIAGNOSIS — R69 Illness, unspecified: Secondary | ICD-10-CM | POA: Diagnosis not present

## 2020-05-30 DIAGNOSIS — F324 Major depressive disorder, single episode, in partial remission: Secondary | ICD-10-CM | POA: Diagnosis not present

## 2020-05-30 MED ORDER — VENLAFAXINE HCL ER 75 MG PO CP24: 75.0000 mg | ORAL_CAPSULE | Freq: Every day | ORAL | 3 refills | Status: DC

## 2020-05-30 MED ORDER — VENLAFAXINE HCL ER 150 MG PO CP24: 150.0000 mg | ORAL_CAPSULE | Freq: Every day | ORAL | 3 refills | Status: DC

## 2020-05-30 NOTE — Progress Notes (Signed)
Subjective:    Patient ID: Melanie Hurst, female    DOB: [birth year], 69 y.o.   MRN: [id number]  Medication Refill    12/2018 Patient is a very pleasant 69 year old Caucasian female who has battled depression off and on for many many years.  Prior to becoming a patient here, the patient was treated by another physician and was taking Abilify 5 mg a day in addition to her Effexor.  Patient's previous doctor discontinued Abilify due to weight gain that the patient experienced when she was taking it.  Around that time however her diet changed considerably.  The patient has been buying Abilify over the Internet and paying cash for it without a prescription all these years and has been taking the medication unbeknownst to me.  She states that the medication helps control her anxiety and her depression.  She would like to stay on the medication.  However she is no longer able to buy the medication over the Internet and therefore she discontinued the medication recently.  Since discontinuing the medication, the depression has worsened.  She is here today requesting that we resume the Abilify and she would like a prescription for this.  Her weight has been relatively stable on the medication that she is taking.  Her blood sugar was recently found to be normal.  She is overdue to recheck her cholesterol.  She also has been experiencing nausea recently and saw GI.  Colonoscopy and endoscopy were significant only for a hiatal hernia.  The patient has been taking Pepcid occasionally over-the-counter and seeing some relief of the nausea.  She denies any significant heartburn however chest pain. At that time, my plan was: We will resume Abilify 5 mg a day in addition to her Effexor and then reassess the patient in 1 month.  I will also check for cholesterol and blood sugar issues on the medication with a CBC, CMP, and fasting lipid panel.  Monitor for parkinsonism due to the atypical antipsychotic.  Add Protonix 40 mg a  day for her hiatal hernia and see if this helps with the nausea that she is experiencing if not, consider gastroparesis  05/15/19 Please see the lab work from March.  At that time her LDL cholesterol was greater than 150.  I recommended Lipitor due to her increased risk of cardiovascular disease as well as her hypertension.  She never started the medication.  Since I last saw the patient, she has also gained 11 pounds on the Abilify.  With that her blood pressure has increased and is now at 146/98.  However she feels much better on the Abilify.  It is substantially helped the depression and she is willing to tolerate the weight gain in order to manage the depression.  She also has a history of chronic fatigue syndrome.  She previously saw neurologist due to the chronic fatigue and excessive daytime sleepiness and was prescribed Provigil.  This is helped the fatigue dramatically.  She is requesting a refill on the Provigil.  I explained about the potential for increased risk of cardiovascular disease on any stimulant medication.  Also explained that I wanted to make sure that her blood pressures well controlled if she takes this medication.  Today her blood pressure is elevated.  She has been taking the losartan 50 mg twice daily.  She is not checking her blood pressure at home.  She denies any chest pain shortness of breath or dyspnea on exertion.  At that time, my plan was: 1.  Pure hypercholesterolemia I will start the patient on Lipitor 20 mg a day with an attempt to try to drive her LDL cholesterol below 100.  I would like to check a CMP and a fasting lipid panel in 3 months - COMPLETE METABOLIC PANEL WITH GFR - Lipid panel  2. Excessive daytime sleepiness Patient is aware of the risk and elects to continue Provigil 200 mg a day for chronic fatigue and excessive daytime sleepiness. - modafinil (PROVIGIL) 200 MG tablet; Take 1 tablet (200 mg total) by mouth daily.  Dispense: 30 tablet; Refill: 5  3.  Chronic fatigue Partly due to her depression.  This seems to be improved since taking Abilify.  She has experienced some weight gain on the Abilify but she is willing to accept the side effect as it has helped her depression and her fatigue.  4. Essential hypertension Blood pressure is elevated.  I believe is partly due to the weight gain.  I recommended 10 pounds of weight loss.  Meanwhile start hydrochlorothiazide 25 mg a day recheck fasting lab work in 3 months.  05/30/20 Patient is requesting assistance with medication for depression.  She states that she has done relatively well since starting the Abilify.  It helped reduce the negative thoughts that would permeate her mind throughout the day.  However she feels a pervasive sense of hopelessness throughout the day.  She denies any suicidal ideation.  She denies any insomnia.  She states that she lacks joy and hope and happiness.  She was questioning if there were any other options to help manage her depression.  She is also interested in possibly increasing her Provigil to the maximum 400 mg a day.  She is also requesting a handicap placard for her car.  She is having to walk using a cane due to pain in both knees and weakness in her legs secondary to osteoarthritis.  She recently bought a lift chair because she was having an increasingly difficult time even standing from a seated position due to the pain in her knees Past Medical History:  Diagnosis Date  . Anemia   . Anxiety   . Depression   . Fibromyalgia   . GERD (gastroesophageal reflux disease)   . Hypertension   . IBS (irritable bowel syndrome)   . Obesity   . Scoliosis   . Sleep apnea    cpap at 7 cm  . Tremor, essential    Past Surgical History:  Procedure Laterality Date  . PCOS surgery  1971  . TONSILECTOMY, ADENOIDECTOMY, BILATERAL MYRINGOTOMY AND TUBES  1962   Current Outpatient Medications on File Prior to Visit  Medication Sig Dispense Refill  . albuterol (VENTOLIN  HFA) 108 (90 Base) MCG/ACT inhaler Inhale 2 puffs into the lungs every 6 (six) hours as needed for wheezing or shortness of breath. 18 g 3  . ARIPiprazole (ABILIFY) 5 MG tablet Take 1 tablet (5 mg total) by mouth daily. 90 tablet 2  . atorvastatin (LIPITOR) 20 MG tablet Take 1 tablet (20 mg total) by mouth daily. (Patient not taking: Reported on 04/01/2020) 90 tablet 3  . hydrochlorothiazide (HYDRODIURIL) 25 MG tablet Take one tablet by mouth daily (Patient not taking: Reported on 04/01/2020) 90 tablet 1  . losartan (COZAAR) 50 MG tablet Take 1 tablet (50 mg total) by mouth 2 (two) times daily. 180 tablet 2  . modafinil (PROVIGIL) 200 MG tablet Take 1 tablet (200 mg total) by mouth daily. 90 tablet 2  . pantoprazole (PROTONIX) 40  MG tablet Take 1 tablet (40 mg total) by mouth daily. 90 tablet 3  . tamsulosin (FLOMAX) 0.4 MG CAPS capsule Take 1 capsule (0.4 mg total) by mouth daily. 30 capsule 0  . venlafaxine XR (EFFEXOR-XR) 75 MG 24 hr capsule Take 1 capsule (75 mg total) by mouth in the morning and at bedtime. 180 capsule 1   No current facility-administered medications on file prior to visit.   Allergies  Allergen Reactions  . Asa [Aspirin] Hives  . Codeine     Vomit    Social History   Socioeconomic History  . Marital status: Married    Spouse name: Not on file  . Number of children: 0  . Years of education: Not on file  . Highest education level: Not on file  Occupational History  . Occupation: retired  Tobacco Use  . Smoking status: Former Smoker    Years: 6.00    Types: Cigarettes  . Smokeless tobacco: Never Used  Vaping Use  . Vaping Use: Former  Substance and Sexual Activity  . Alcohol use: Yes    Comment: Occasional wine  . Drug use: No  . Sexual activity: Not Currently  Other Topics Concern  . Not on file  Social History Narrative  . Not on file   Social Determinants of Health   Financial Resource Strain:   . Difficulty of Paying Living Expenses: Not on file    Food Insecurity:   . Worried About Charity fundraiser in the Last Year: Not on file  . Ran Out of Food in the Last Year: Not on file  Transportation Needs:   . Lack of Transportation (Medical): Not on file  . Lack of Transportation (Non-Medical): Not on file  Physical Activity:   . Days of Exercise per Week: Not on file  . Minutes of Exercise per Session: Not on file  Stress:   . Feeling of Stress : Not on file  Social Connections:   . Frequency of Communication with Friends and Family: Not on file  . Frequency of Social Gatherings with Friends and Family: Not on file  . Attends Religious Services: Not on file  . Active Member of Clubs or Organizations: Not on file  . Attends Archivist Meetings: Not on file  . Marital Status: Not on file  Intimate Partner Violence:   . Fear of Current or Ex-Partner: Not on file  . Emotionally Abused: Not on file  . Physically Abused: Not on file  . Sexually Abused: Not on file      Review of Systems  All other systems reviewed and are negative.      Objective:   Physical Exam Vitals reviewed.  Constitutional:      Appearance: She is well-developed.  Cardiovascular:     Rate and Rhythm: Normal rate.     Heart sounds: Normal heart sounds. No murmur heard.   Pulmonary:     Effort: Pulmonary effort is normal. No respiratory distress.     Breath sounds: Normal breath sounds. No wheezing.  Abdominal:     General: Bowel sounds are normal. There is no distension.     Palpations: Abdomen is soft.     Tenderness: There is no abdominal tenderness. There is no rebound.  Neurological:     Mental Status: She is alert and oriented to person, place, and time.     Cranial Nerves: No cranial nerve deficit.     Motor: No abnormal muscle tone.  Coordination: Coordination normal.     Deep Tendon Reflexes: Reflexes are normal and symmetric.           Assessment & Plan:  Major depressive disorder in partial remission,  unspecified whether recurrent ([facility]) - Plan: venlafaxine XR (EFFEXOR-XR) 75 MG 24 hr capsule  I recommended that we increase her Effexor to 225 mg a day.  She will take 150 mg tablet in the morning and 75 mg tablet in the morning.  Recheck in 4 to 6 weeks.  At that time consider switching Abilify to Rexulti if necessary.  Would hold off on increasing Provigil to 400 mg a day until after we have explored these 2 options 1st.  I did give the patient a prescription for a lift chair as well as handicap placard due to her trouble walking and standing which is attributed to her osteoarthritis in her knee

## 2020-06-11 ENCOUNTER — Encounter: Payer: Self-pay | Admitting: Family Medicine

## 2020-06-12 ENCOUNTER — Other Ambulatory Visit: Payer: Self-pay | Admitting: Family Medicine

## 2020-06-12 MED ORDER — MODAFINIL 100 MG PO TABS: 300.0000 mg | ORAL_TABLET | Freq: Every day | ORAL | 0 refills | Status: DC

## 2020-06-19 ENCOUNTER — Encounter: Payer: Self-pay | Admitting: Family Medicine

## 2020-06-20 ENCOUNTER — Other Ambulatory Visit: Payer: Self-pay | Admitting: Family Medicine

## 2020-06-20 MED ORDER — MODAFINIL 200 MG PO TABS: 400.0000 mg | ORAL_TABLET | Freq: Every day | ORAL | 0 refills | Status: DC

## 2020-07-10 ENCOUNTER — Other Ambulatory Visit: Payer: Self-pay

## 2020-07-10 ENCOUNTER — Ambulatory Visit (INDEPENDENT_AMBULATORY_CARE_PROVIDER_SITE_OTHER): Payer: Medicare HMO | Admitting: Family Medicine

## 2020-07-10 ENCOUNTER — Other Ambulatory Visit: Payer: Self-pay | Admitting: Family Medicine

## 2020-07-10 VITALS — BP 136/90 | HR 135 | Temp 98.4°F | Ht 70.0 in | Wt 289.0 lb

## 2020-07-10 DIAGNOSIS — F324 Major depressive disorder, single episode, in partial remission: Secondary | ICD-10-CM

## 2020-07-10 DIAGNOSIS — R0609 Other forms of dyspnea: Secondary | ICD-10-CM

## 2020-07-10 DIAGNOSIS — R0600 Dyspnea, unspecified: Secondary | ICD-10-CM

## 2020-07-10 DIAGNOSIS — I499 Cardiac arrhythmia, unspecified: Secondary | ICD-10-CM | POA: Diagnosis not present

## 2020-07-10 DIAGNOSIS — R002 Palpitations: Secondary | ICD-10-CM

## 2020-07-10 DIAGNOSIS — Z23 Encounter for immunization: Secondary | ICD-10-CM

## 2020-07-10 NOTE — Progress Notes (Signed)
Subjective:    Patient ID: Melanie Hurst, female    DOB: 1951-03-16, 69 y.o.   MRN: [id number]  Patient is a very pleasant 69 year old Caucasian female who presents today for follow-up.  At her last visit, she was reporting severe depression.  She states that she felt desperate.  Ultimately we decided to increase her Effexor to 225 mg daily and also increase her Provigil to 400 mg daily.  She states that the depression has improved slightly however I am instantly worried when I see the patient today.  She appears diaphoretic.  She has a prominent essential tremor in both hands and both feet.  She is tachycardic.  She reports feeling extremely short of breath.  I performed an EKG which originally suggested atrial fibrillation.  She did have an irregularly irregular heart rate on exam.  However close inspection of the original EKG shows a P wave before every QRS complex.  The rhythm was irregular however she was having frequent PACs.  These had markedly different PR intervals and also frequent PVCs.  Therefore I believe the computer interpretation is wrong.  I believe she was in normal sinus rhythm with frequent ectopic beats including PVCs and PACs.  I repeated the EKG which did show normal sinus rhythm just a few minutes later.  However on exam, I can auscultate frequent PACs and PVCs.  I can also auscultate occasional 2 or 3 beat runs of tachycardia that suggest SVT.  I believe the patient would benefit from an event monitor to determine the nature of her irregularities and to rule out paroxysmal atrial fibrillation.  However I am concerned by her shortness of breath with activity, her frequent irregular heartbeats, and her diaphoresis.  She is diaphoretic simply walking from her car into the exam room which is less than 120 feet. Past Medical History:  Diagnosis Date  . Anemia   . Anxiety   . Depression   . Fibromyalgia   . GERD (gastroesophageal reflux disease)   . Hypertension   . IBS (irritable  bowel syndrome)   . Obesity   . Scoliosis   . Sleep apnea    cpap at 7 cm  . Tremor, essential    Past Surgical History:  Procedure Laterality Date  . PCOS surgery  1971  . TONSILECTOMY, ADENOIDECTOMY, BILATERAL MYRINGOTOMY AND TUBES  1962   Current Outpatient Medications on File Prior to Visit  Medication Sig Dispense Refill  . ARIPiprazole (ABILIFY) 5 MG tablet Take 1 tablet (5 mg total) by mouth daily. 90 tablet 2  . losartan (COZAAR) 50 MG tablet Take 1 tablet (50 mg total) by mouth 2 (two) times daily. 180 tablet 2  . modafinil (PROVIGIL) 200 MG tablet Take 2 tablets (400 mg total) by mouth daily. 180 tablet 0  . pantoprazole (PROTONIX) 40 MG tablet Take 1 tablet (40 mg total) by mouth daily. 90 tablet 3  . venlafaxine XR (EFFEXOR XR) 150 MG 24 hr capsule Take 1 capsule (150 mg total) by mouth daily with breakfast. Patient will take 225 mg total 90 capsule 3  . venlafaxine XR (EFFEXOR-XR) 75 MG 24 hr capsule Take 1 capsule (75 mg total) by mouth daily. Patient will take 225 mg total 90 capsule 3   No current facility-administered medications on file prior to visit.   Allergies  Allergen Reactions  . Asa [Aspirin] Hives  . Codeine     Vomit    Social History   Socioeconomic History  . Marital status: Married  Spouse name: Not on file  . Number of children: 0  . Years of education: Not on file  . Highest education level: Not on file  Occupational History  . Occupation: retired  Tobacco Use  . Smoking status: Former Smoker    Years: 6.00    Types: Cigarettes  . Smokeless tobacco: Never Used  Vaping Use  . Vaping Use: Former  Substance and Sexual Activity  . Alcohol use: Yes    Comment: Occasional wine  . Drug use: No  . Sexual activity: Not Currently  Other Topics Concern  . Not on file  Social History Narrative  . Not on file   Social Determinants of Health   Financial Resource Strain:   . Difficulty of Paying Living Expenses: Not on file  Food  Insecurity:   . Worried About Charity fundraiser in the Last Year: Not on file  . Ran Out of Food in the Last Year: Not on file  Transportation Needs:   . Lack of Transportation (Medical): Not on file  . Lack of Transportation (Non-Medical): Not on file  Physical Activity:   . Days of Exercise per Week: Not on file  . Minutes of Exercise per Session: Not on file  Stress:   . Feeling of Stress : Not on file  Social Connections:   . Frequency of Communication with Friends and Family: Not on file  . Frequency of Social Gatherings with Friends and Family: Not on file  . Attends Religious Services: Not on file  . Active Member of Clubs or Organizations: Not on file  . Attends Archivist Meetings: Not on file  . Marital Status: Not on file  Intimate Partner Violence:   . Fear of Current or Ex-Partner: Not on file  . Emotionally Abused: Not on file  . Physically Abused: Not on file  . Sexually Abused: Not on file      Review of Systems  All other systems reviewed and are negative.      Objective:   Physical Exam Vitals reviewed.  Constitutional:      Appearance: She is well-developed. She is obese. She is diaphoretic.  Cardiovascular:     Rate and Rhythm: Tachycardia present. Rhythm irregular.     Heart sounds: Normal heart sounds. No murmur heard.   Pulmonary:     Effort: Pulmonary effort is normal. No respiratory distress.     Breath sounds: Normal breath sounds. No wheezing.  Abdominal:     General: Bowel sounds are normal. There is no distension.     Palpations: Abdomen is soft.     Tenderness: There is no abdominal tenderness. There is no rebound.  Musculoskeletal:     Right lower leg: No edema.     Left lower leg: No edema.  Neurological:     Mental Status: She is alert and oriented to person, place, and time.     Cranial Nerves: No cranial nerve deficit.     Motor: No abnormal muscle tone.     Coordination: Coordination normal.     Deep Tendon  Reflexes: Reflexes are normal and symmetric.           Assessment & Plan:  Heart palpitations - Plan: EKG 12-Lead, EKG 12-Lead  Irregular heart rate  I believe the frequent PACs and PVCs are partly due to medication.  Therefore I recommended that we wean off Provigil rapidly.  Decrease Provigil to 200 mg a day for 1 week and then 100 mg  a day thereafter.  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  I also recommended that the patient decrease her Effexor from 225 mg a day to 150 mg a day.  We will begin Toprol-XL 25 mg a day.  Heart rate after sitting in the exam room was 82 bpm.  I will consult her cardiologist for an event monitor given her irregular heart rate and her irregular heartbeats and abnormal EKG to rule out paroxysmal atrial fibrillation.  Also believe that she would benefit from a stress test given her dyspnea on exertion.

## 2020-08-03 NOTE — Progress Notes (Signed)
Cardiology Office Note:    Date:  08/05/2020   ID:  [name], DOB 1950-11-04, MRN [id number]  PCP:  [name], MD  [facility] Cardiologist:  Freada Bergeron, MD  [facility] Electrophysiologist:  None   Referring MD: [name], MD    History of Present Illness:    Melanie Hurst is a 69 y.o. female with a hx of anemia, anxiety, depression, HTN and fibromyalgia who was referred by Dr. Dennard Schaumann for evaluation of palpitations.  Patient states that she has been having significant palpitations/"strong heart beats" and SOB with exertion. Has been ongoing for years but has gotten worse recently such that she cannot walk across the room and back without needing to stop to rest. Previously had monitor a couple of years ago which was unremarkable but she states she did not "hit the tirgger button because she thought it was a continuous device." No chest pain with exertion, but does have some nausea when she walks that goes away with resting for a few minutes. No lightheadedness, dizziness, or syncope. No fevers, chills, bleeding issues. No known CAD. Has not had a stress test before.  Family history: Sister, mother and father: Afib. Paternal aunt with CAD.   Blood pressure at home 120s/80s. TC 229, HDL 53, LDL 152, TG 121. Had muscle aches with simvastatin in the past.   ECG at PCPs office showed sinus rhythm but frequent PACs and PVCs.  Past Medical History:  Diagnosis Date  . Anemia   . Anxiety   . Depression   . Fibromyalgia   . GERD (gastroesophageal reflux disease)   . Hypertension   . IBS (irritable bowel syndrome)   . Obesity   . Scoliosis   . Sleep apnea    cpap at 7 cm  . Tremor, essential     Past Surgical History:  Procedure Laterality Date  . PCOS surgery  1971  . TONSILECTOMY, ADENOIDECTOMY, BILATERAL MYRINGOTOMY AND TUBES  1962    Current Medications: Current Meds  Medication Sig  . ARIPiprazole (ABILIFY) 5 MG tablet Take 1 tablet (5  mg total) by mouth daily.  Marland Kitchen losartan (COZAAR) 50 MG tablet Take 1 tablet (50 mg total) by mouth 2 (two) times daily.  . modafinil (PROVIGIL) 200 MG tablet Take 200 mg by mouth daily.  . pantoprazole (PROTONIX) 40 MG tablet Take 1 tablet (40 mg total) by mouth daily.  Marland Kitchen sulfamethoxazole-trimethoprim (BACTRIM DS) 800-160 MG tablet Take 1 tablet by mouth 2 (two) times daily.  Marland Kitchen venlafaxine XR (EFFEXOR XR) 150 MG 24 hr capsule Take 1 capsule (150 mg total) by mouth daily with breakfast. Patient will take 225 mg total  . venlafaxine XR (EFFEXOR-XR) 75 MG 24 hr capsule Take 75 mg by mouth daily with breakfast.     Allergies:   Asa [aspirin] and Codeine   Social History   Socioeconomic History  . Marital status: Married    Spouse name: Not on file  . Number of children: 0  . Years of education: Not on file  . Highest education level: Not on file  Occupational History  . Occupation: retired  Tobacco Use  . Smoking status: Former Smoker    Years: 6.00    Types: Cigarettes  . Smokeless tobacco: Never Used  Vaping Use  . Vaping Use: Former  Substance and Sexual Activity  . Alcohol use: Yes    Comment: Occasional wine  . Drug use: No  . Sexual activity: Not Currently  Other  Topics Concern  . Not on file  Social History Narrative  . Not on file   Social Determinants of Health   Financial Resource Strain:   . Difficulty of Paying Living Expenses: Not on file  Food Insecurity:   . Worried About Charity fundraiser in the Last Year: Not on file  . Ran Out of Food in the Last Year: Not on file  Transportation Needs:   . Lack of Transportation (Medical): Not on file  . Lack of Transportation (Non-Medical): Not on file  Physical Activity:   . Days of Exercise per Week: Not on file  . Minutes of Exercise per Session: Not on file  Stress:   . Feeling of Stress : Not on file  Social Connections:   . Frequency of Communication with Friends and Family: Not on file  . Frequency of  Social Gatherings with Friends and Family: Not on file  . Attends Religious Services: Not on file  . Active Member of Clubs or Organizations: Not on file  . Attends Archivist Meetings: Not on file  . Marital Status: Not on file     Family History: The patient's family history includes Breast cancer in her paternal aunt and sister; Colon cancer in her paternal aunt; Colon polyps in her father; Irritable bowel syndrome in her paternal aunt; Kidney failure in her father; Melanoma in her mother; Other in her mother; Stomach cancer in her maternal uncle; Stroke in her mother. There is no history of Esophageal cancer or Rectal cancer.  ROS:   Please see the history of present illness.    Review of Systems  Constitutional: Positive for diaphoresis and malaise/fatigue. Negative for chills and fever.  HENT: Negative for sore throat.   Eyes: Negative for double vision.  Respiratory: Negative for cough.   Cardiovascular: Positive for palpitations and leg swelling. Negative for chest pain, orthopnea, claudication and PND.  Gastrointestinal: Positive for abdominal pain and nausea. Negative for blood in stool and vomiting.  Genitourinary: Positive for frequency. Negative for hematuria.  Musculoskeletal: Positive for myalgias.  Skin: Negative for rash.  Neurological: Negative for dizziness, focal weakness and loss of consciousness.  Endo/Heme/Allergies: Does not bruise/bleed easily.    EKGs/Labs/Other Studies Reviewed:    The following studies were reviewed today: TTE 09-May-2017: Study Conclusions  - Left ventricle: The cavity size was normal. Systolic function was  normal. The estimated ejection fraction was in the range of 60%  to 65%. Wall motion was normal; there were no regional wall  motion abnormalities. Left ventricular diastolic function  parameters were normal.  - Aortic valve: Trileaflet; mildly thickened, mildly calcified  leaflets.  - Mitral valve: There was  trivial regurgitation.  - Right ventricle: The cavity size was mildly dilated. Wall  thickness was normal.  - Tricuspid valve: There was trivial regurgitation.   EKG:  EKG is ordered today.  The ekg ordered today demonstrates NSR with RAD with HR 75.  Recent Labs: 04/01/2020: ALT 10; BUN 16; Creat 1.03; Hemoglobin 14.8; Platelets 448; Potassium 4.9; Sodium 142  Recent Lipid Panel    Component Value Date/Time   CHOL 229 (H) 12/12/2018 1132   TRIG 121 12/12/2018 1132   HDL 53 12/12/2018 1132   CHOLHDL 4.3 12/12/2018 1132   VLDL 48 (H) 08/02/2016 1455   LDLCALC 152 (H) 12/12/2018 1132      Physical Exam:    VS:  BP 130/90   Pulse 75   Ht _0  (1.778 m)  Wt 291 lb 3.2 oz (132.1 kg)   SpO2 95%   BMI 41.78 kg/m     Wt Readings from Last 3 Encounters:  08/05/20 291 lb 3.2 oz (132.1 kg)  08/04/20 292 lb 6.4 oz (132.6 kg)  07/10/20 289 lb (131.1 kg)     GEN: Comfortable, NAD HEENT: Normal NECK: No JVD; No carotid bruits CARDIAC: RRR, no murmurs, rubs, gallops RESPIRATORY:  Clear to auscultation without rales, wheezing or rhonchi  ABDOMEN: Soft, non-tender, non-distended MUSCULOSKELETAL:  Trace pedal edema SKIN: Warm and dry NEUROLOGIC:  Alert and oriented x 3 PSYCHIATRIC:  Normal affect   ASSESSMENT:    1. Precordial pain    PLAN:    In order of problems listed above:  #Shortness of Breath on Exertion #Nausea with Exertion: Has been ongoing for years but worsened over the past several months. Reports she is unable to make it across the room and back without needing to stop to rest. No chest pain but does feel nauseas with exertion that resolves with rest. Concerning for anginal equivalent. -Check coronary CTA -TTE in 2018 with normal BiV function, no significant valvular abnormalities  #Palpitations: Patient reports palpitations with exertion and occasionally when laying down at night to sleep. ECG at PCPs office with frequent PACs and PVCs. No known history  of Afib but runs in her family. No dizziness, lightheadedness or syncope. Has symptoms daily -Check continuous 7day monitor -Start metoprolol 12.70m BID  #HTN: Well controlled. Managed by PCP. -Continue losartan 530mdaily  #HLD: LDL 152, HDL 53. CT abdomen pelvis with aortic atherosclerosis.  -Had muscle aches with simvastatin in the past -Start crestor 1017maily and see if she tolerates -Repeat lipid panel in 6 weeks with LFTs  Medication Adjustments/Labs and Tests Ordered: Current medicines are reviewed at length with the patient today.  Concerns regarding medicines are outlined above.  Orders Placed This Encounter  Procedures  . CT CORONARY MORPH W/CTA COR W/SCORE W/CA W/CM &/OR WO/CM  . CT CORONARY FRACTIONAL FLOW RESERVE DATA PREP  . CT CORONARY FRACTIONAL FLOW RESERVE FLUID ANALYSIS  . Basic metabolic panel  . LONG TERM MONITOR (3-14 DAYS)  . EKG 12-Lead   Meds ordered this encounter  Medications  . metoprolol tartrate (LOPRESSOR) 25 MG tablet    Sig: Take 0.5 tablets (12.5 mg total) by mouth 2 (two) times daily.    Dispense:  90 tablet    Refill:  3  . rosuvastatin (CRESTOR) 10 MG tablet    Sig: Take 1 tablet (10 mg total) by mouth daily.    Dispense:  90 tablet    Refill:  3  . metoprolol tartrate (LOPRESSOR) 100 MG tablet    Sig: Take 1 tablet (100 mg total) two hours prior to CT scan.    Dispense:  1 tablet    Refill:  0    Patient Instructions  Medication Instructions:  Your physician has recommended you make the following change in your medication:  1) START taking Crestor (rosuvastatin) 10 mg daily  2) START taking metoprolol tartrate 12.5 mg twice per day  *If you need a refill on your cardiac medications before your next appointment, please call your pharmacy*  Testing/Procedures: Your physician has recommended that you wear an event monitor. Event monitors are medical devices that record the heart's electrical activity. Doctors most often us [place]  monitors to diagnose arrhythmias. Arrhythmias are problems with the speed or rhythm of the heartbeat. The monitor is a small, portable device. You can  wear one while you do your normal daily activities. This is usually used to diagnose what is causing palpitations/syncope (passing out).  Your physician has recommended that you have a Coronary CTA scan done. Please see below for further information.   Follow-Up: At [facility], you and your health needs are our priority.  As part of our continuing mission to provide you with exceptional heart care, we have created designated Provider Care Teams.  These Care Teams include your primary Cardiologist (physician) and Advanced Practice Providers (APPs -  Physician Assistants and Nurse Practitioners) who all work together to provide you with the care you need, when you need it.  Your next appointment:   3 month(s)  The format for your next appointment:   In Person  Provider:   You may see Freada Bergeron, MD or one of the following Advanced Practice Providers on your designated Care Team:    Richardson Dopp, PA-C  Robbie Lis, [place]    Other Instructions Your cardiac CT will be scheduled at:   [facility] [address] [place], [place] [address] [phone]  Please arrive at the [facility] main entrance of [facility] 30 minutes prior to test start time. Proceed to the [facility] Radiology Department (first floor) to check-in and test prep.   Please follow these instructions carefully (unless otherwise directed):  On the Night Before the Test: . Be sure to Drink plenty of water. . Do not consume any caffeinated/decaffeinated beverages or chocolate 12 hours prior to your test. . Do not take any antihistamines 12 hours prior to your test.  On the Day of the Test: . Drink plenty of water. Do not drink any water within one hour of the test. . Do not eat any food 4 hours prior to the test. . You may take your  regular medications prior to the test.  . Take metoprolol (Lopressor) 100 mg two hours prior to test. . FEMALES- please wear underwire-free bra if available  After the Test: . Drink plenty of water. . After receiving IV contrast, you may experience a mild flushed feeling. This is normal. . On occasion, you may experience a mild rash up to 24 hours after the test. This is not dangerous. If this occurs, you can take Benadryl 25 mg and increase your fluid intake. . If you experience trouble breathing, this can be serious. If it is severe call 911 IMMEDIATELY. If it is mild, please call our office. . If you take any of these medications: Glipizide/Metformin, Avandament, Glucavance, please do not take 48 hours after completing test unless otherwise instructed.   Once we have confirmed authorization from your insurance company, we will call you to set up a date and time for your test. Based on how quickly your insurance processes prior authorizations requests, please allow up to 4 weeks to be contacted for scheduling your Cardiac CT appointment. Be advised that routine Cardiac CT appointments could be scheduled as many as 8 weeks after your provider has ordered it.  For non-scheduling related questions, please contact the cardiac imaging nurse navigator should you have any questions/concerns: Marchia Bond, Cardiac Imaging Nurse Navigator Burley Saver, Interim Cardiac Imaging Nurse [facility] Direct Office Dial: [phone]   For scheduling needs, including cancellations and rescheduling, please call Vivien Rota at [phone], option 3.        Signed, Freada Bergeron, MD  08/05/2020 12:14 PM    [facility]  HeartCare

## 2020-08-04 ENCOUNTER — Encounter: Payer: Self-pay | Admitting: Family Medicine

## 2020-08-04 ENCOUNTER — Ambulatory Visit (INDEPENDENT_AMBULATORY_CARE_PROVIDER_SITE_OTHER): Payer: Medicare HMO | Admitting: Family Medicine

## 2020-08-04 ENCOUNTER — Other Ambulatory Visit: Payer: Self-pay | Admitting: Family Medicine

## 2020-08-04 ENCOUNTER — Other Ambulatory Visit: Payer: Self-pay

## 2020-08-04 VITALS — BP 150/94 | HR 74 | Temp 99.3°F | Ht 70.0 in | Wt 292.4 lb

## 2020-08-04 DIAGNOSIS — I10 Essential (primary) hypertension: Secondary | ICD-10-CM | POA: Diagnosis not present

## 2020-08-04 DIAGNOSIS — S99922A Unspecified injury of left foot, initial encounter: Secondary | ICD-10-CM | POA: Diagnosis not present

## 2020-08-04 MED ORDER — SULFAMETHOXAZOLE-TRIMETHOPRIM 800-160 MG PO TABS: 1.0000 | ORAL_TABLET | Freq: Two times a day (BID) | ORAL | 0 refills | Status: DC

## 2020-08-04 NOTE — Patient Instructions (Signed)
Take the blood pressure medication twice a day  Take antibiotics Use epson salt to soak F/U as previous

## 2020-08-04 NOTE — Progress Notes (Signed)
   Subjective:    Patient ID: Melanie Hurst, female    DOB: 1951/01/06, 69 y.o.   MRN: [id number]  Patient presents for Foot Pain (Pt stated that her dog stepped on her Lt foot  x1wk and has been swollen and discoloration.)  Pt here with swelling and pain of her left foot  Her dog who is about 160lb, stepped on her foot  No pain with walking  she took a few doses of amoxiiclin she had had home and that helped some of the redness   no drainage from the nailbed   No significant pain    HTN bp elevated, reviewed meds she has only been taking losartan once a day   Review Of Systems:  GEN- denies fatigue, fever, weight loss,weakness, recent illness HEENT- denies eye drainage, change in vision, nasal discharge, CVS- denies chest pain, palpitations RESP- denies SOB, cough, wheeze ABD- denies N/V, change in stools, abd pain GU- denies dysuria, hematuria, dribbling, incontinence MSK- denies joint pain, muscle aches, injury Neuro- denies headache, dizziness, syncope, seizure activity       Objective:    BP (!) 150/94 (BP Location: Right Arm, Patient Position: Sitting, Cuff Size: Large)   Pulse 74   Temp 99.3 F (37.4 C) (Oral)   Ht 5\' 10"  (1.778 m)   Wt 292 lb 6.4 oz (132.6 kg)   SpO2 97%   BMI 41.96 kg/m  GEN- NAD, alert and oriented x3 EXT- Right foot, no edema, has mild motteling on hands, feet,pulses int act  Left great toe +swelling, mild swelling 2nd digit, neg squeeze test, TTP below nail bed where erythema and swelling,mild blistered apperance  FROM toe  Pulses- Radial, DP- 2+        Assessment & Plan:      Problem List Items Addressed This Visit      Unprioritized   Hypertension    Take losartan 50mg  twice a day as directed Recheck BP Thursday at appt        Other Visit Diagnoses    Injury of left great toe ? cellulitis     -  Primary   Crush like injury but no pain, with localized redness, blistered like region, will add bactrim BID x 5 days to cover  infection, soak epson salt, Xray will noT add anything as no deformity no pain with walking, normal weight bearing      Note: This dictation was prepared with Dragon dictation along with smaller phrase technology. Any transcriptional errors that result from this process are unintentional.

## 2020-08-04 NOTE — Assessment & Plan Note (Signed)
Take losartan 50mg  twice a day as directed Recheck BP Thursday at appt

## 2020-08-05 ENCOUNTER — Encounter: Payer: Self-pay | Admitting: Cardiology

## 2020-08-05 ENCOUNTER — Ambulatory Visit (INDEPENDENT_AMBULATORY_CARE_PROVIDER_SITE_OTHER): Payer: Medicare HMO | Admitting: Cardiology

## 2020-08-05 DIAGNOSIS — R072 Precordial pain: Secondary | ICD-10-CM | POA: Diagnosis not present

## 2020-08-05 MED ORDER — METOPROLOL TARTRATE 25 MG PO TABS: 12.5000 mg | ORAL_TABLET | Freq: Two times a day (BID) | ORAL | 3 refills | Status: DC

## 2020-08-05 MED ORDER — METOPROLOL TARTRATE 100 MG PO TABS: ORAL_TABLET | ORAL | 0 refills | Status: DC

## 2020-08-05 MED ORDER — ROSUVASTATIN CALCIUM 10 MG PO TABS: 10.0000 mg | ORAL_TABLET | Freq: Every day | ORAL | 3 refills | Status: DC

## 2020-08-05 NOTE — Patient Instructions (Addendum)
Medication Instructions:  Your physician has recommended you make the following change in your medication:  1) START taking Crestor (rosuvastatin) 10 mg daily  2) START taking metoprolol tartrate 12.5 mg twice per day  *If you need a refill on your cardiac medications before your next appointment, please call your pharmacy*  Testing/Procedures: Your physician has recommended that you wear an event monitor. Event monitors are medical devices that record the heart's electrical activity. Doctors most often [place] these monitors to diagnose arrhythmias. Arrhythmias are problems with the speed or rhythm of the heartbeat. The monitor is a small, portable device. You can wear one while you do your normal daily activities. This is usually used to diagnose what is causing palpitations/syncope (passing out).  Your physician has recommended that you have a Coronary CTA scan done. Please see below for further information.   Follow-Up: At [facility], you and your health needs are our priority.  As part of our continuing mission to provide you with exceptional heart care, we have created designated Provider Care Teams.  These Care Teams include your primary Cardiologist (physician) and Advanced Practice Providers (APPs -  Physician Assistants and Nurse Practitioners) who all work together to provide you with the care you need, when you need it.  Your next appointment:   3 month(s)  The format for your next appointment:   In Person  Provider:   You may see Freada Bergeron, MD or one of the following Advanced Practice Providers on your designated Care Team:    Richardson Dopp, PA-C  Robbie Lis, [place]    Other Instructions Your cardiac CT will be scheduled at:   [facility] [address] [place], [place] [address] [phone]  Please arrive at the [facility] main entrance of [facility] 30 minutes prior to test start time. Proceed to the [facility] Radiology Department  (first floor) to check-in and test prep.   Please follow these instructions carefully (unless otherwise directed):  On the Night Before the Test: . Be sure to Drink plenty of water. . Do not consume any caffeinated/decaffeinated beverages or chocolate 12 hours prior to your test. . Do not take any antihistamines 12 hours prior to your test.  On the Day of the Test: . Drink plenty of water. Do not drink any water within one hour of the test. . Do not eat any food 4 hours prior to the test. . You may take your regular medications prior to the test.  . Take metoprolol (Lopressor) 100 mg two hours prior to test. . FEMALES- please wear underwire-free bra if available  After the Test: . Drink plenty of water. . After receiving IV contrast, you may experience a mild flushed feeling. This is normal. . On occasion, you may experience a mild rash up to 24 hours after the test. This is not dangerous. If this occurs, you can take Benadryl 25 mg and increase your fluid intake. . If you experience trouble breathing, this can be serious. If it is severe call 911 IMMEDIATELY. If it is mild, please call our office. . If you take any of these medications: Glipizide/Metformin, Avandament, Glucavance, please do not take 48 hours after completing test unless otherwise instructed.   Once we have confirmed authorization from your insurance company, we will call you to set up a date and time for your test. Based on how quickly your insurance processes prior authorizations requests, please allow up to 4 weeks to be contacted for  scheduling your Cardiac CT appointment. Be advised that routine Cardiac CT appointments could be scheduled as many as 8 weeks after your provider has ordered it.  For non-scheduling related questions, please contact the cardiac imaging nurse navigator should you have any questions/concerns: Marchia Bond, Cardiac Imaging Nurse Navigator Burley Saver, Interim Cardiac Imaging Nurse  [place] [facility] Direct Office Dial: [phone]   For scheduling needs, including cancellations and rescheduling, please call Vivien Rota at [phone], option 3.

## 2020-08-07 ENCOUNTER — Ambulatory Visit (INDEPENDENT_AMBULATORY_CARE_PROVIDER_SITE_OTHER): Payer: Medicare HMO | Admitting: Family Medicine

## 2020-08-07 ENCOUNTER — Other Ambulatory Visit: Payer: Self-pay

## 2020-08-07 VITALS — BP 114/78 | HR 103 | Temp 97.2°F | Resp 18 | Ht 70.0 in | Wt 292.0 lb

## 2020-08-07 DIAGNOSIS — F333 Major depressive disorder, recurrent, severe with psychotic symptoms: Secondary | ICD-10-CM

## 2020-08-07 DIAGNOSIS — R69 Illness, unspecified: Secondary | ICD-10-CM | POA: Diagnosis not present

## 2020-08-07 MED ORDER — BREXPIPRAZOLE 1 MG PO TABS: 1.0000 mg | ORAL_TABLET | Freq: Every day | ORAL | 1 refills | Status: DC

## 2020-08-07 NOTE — Progress Notes (Signed)
Subjective:    Patient ID: Melanie Hurst, female    DOB: [birth year], 69 y.o.   MRN: [id number] 07/10/20 Patient is a very pleasant 69 year old Caucasian female who presents today for follow-up.  At her last visit, she was reporting severe depression.  She states that she felt desperate.  Ultimately we decided to increase her Effexor to 225 mg daily and also increase her Provigil to 400 mg daily.  She states that the depression has improved slightly however I am instantly worried when I see the patient today.  She appears diaphoretic.  She has a prominent essential tremor in both hands and both feet.  She is tachycardic.  She reports feeling extremely short of breath.  I performed an EKG which originally suggested atrial fibrillation.  She did have an irregularly irregular heart rate on exam.  However close inspection of the original EKG shows a P wave before every QRS complex.  The rhythm was irregular however she was having frequent PACs.  These had markedly different PR intervals and also frequent PVCs.  Therefore I believe the computer interpretation is wrong.  I believe she was in normal sinus rhythm with frequent ectopic beats including PVCs and PACs.  I repeated the EKG which did show normal sinus rhythm just a few minutes later.  However on exam, I can auscultate frequent PACs and PVCs.  I can also auscultate occasional 2 or 3 beat runs of tachycardia that suggest SVT.  I believe the patient would benefit from an event monitor to determine the nature of her irregularities and to rule out paroxysmal atrial fibrillation.  However I am concerned by her shortness of breath with activity, her frequent irregular heartbeats, and her diaphoresis.  She is diaphoretic simply walking from her car into the exam room which is less than 120 feet.  At that time, my plan was: I believe the frequent PACs and PVCs are partly due to medication.  Therefore I recommended that we wean off Provigil rapidly.  Decrease Provigil  to 200 mg a day for 1 week and then 100 mg a day thereafter.  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  I also recommended that the patient decrease her Effexor from 225 mg a day to 150 mg a day.  We will begin Toprol-XL 25 mg a day.  Heart rate after sitting in the exam room was 82 bpm.  I will consult her cardiologist for an event monitor given her irregular heart rate and her irregular heartbeats and abnormal EKG to rule out paroxysmal atrial fibrillation.  Also believe that she would benefit from a stress test given her dyspnea on exertion.  08/07/20 Patient has seen cardiology.  They are scheduling the patient for an event monitor.  They are also scheduling her for coronary artery CT.  She continues to have dyspnea on exertion.  However she is here today for severe depression.  She states that she can even leave the home.  She states that she is unable to even sit outside unless she sitting under a canopy because she feels unsafe.  She has no desire to do anything.  She reports anhedonia.  She reports severe depression.  She can even make herself take a bath or perform daily basic hygiene.  She denies any suicidal ideation.  She does report some hallucinations.  She states that she will see dead animals similar to her right heel.  She is fully aware that they are  not present.  She has insight into the hallucinations however she states that she still sees them.  She denies any delusions.  She denies any auditory hallucinations.  She denies any suicidal ideation or homicidal ideation Past Medical History:  Diagnosis Date  . Anemia   . Anxiety   . Depression   . Fibromyalgia   . GERD (gastroesophageal reflux disease)   . Hypertension   . IBS (irritable bowel syndrome)   . Obesity   . Scoliosis   . Sleep apnea    cpap at 7 cm  . Tremor, essential    Past Surgical History:  Procedure Laterality Date  . PCOS surgery  1971  . TONSILECTOMY, ADENOIDECTOMY,  BILATERAL MYRINGOTOMY AND TUBES  1962   Current Outpatient Medications on File Prior to Visit  Medication Sig Dispense Refill  . ARIPiprazole (ABILIFY) 5 MG tablet Take 1 tablet (5 mg total) by mouth daily. 90 tablet 2  . losartan (COZAAR) 50 MG tablet Take 1 tablet (50 mg total) by mouth 2 (two) times daily. 180 tablet 2  . metoprolol tartrate (LOPRESSOR) 100 MG tablet Take 1 tablet (100 mg total) two hours prior to CT scan. 1 tablet 0  . metoprolol tartrate (LOPRESSOR) 25 MG tablet Take 0.5 tablets (12.5 mg total) by mouth 2 (two) times daily. 90 tablet 3  . modafinil (PROVIGIL) 200 MG tablet Take 200 mg by mouth daily.    . pantoprazole (PROTONIX) 40 MG tablet Take 1 tablet (40 mg total) by mouth daily. 90 tablet 3  . rosuvastatin (CRESTOR) 10 MG tablet Take 1 tablet (10 mg total) by mouth daily. 90 tablet 3  . sulfamethoxazole-trimethoprim (BACTRIM DS) 800-160 MG tablet Take 1 tablet by mouth 2 (two) times daily. 10 tablet 0  . venlafaxine XR (EFFEXOR XR) 150 MG 24 hr capsule Take 1 capsule (150 mg total) by mouth daily with breakfast. Patient will take 225 mg total 90 capsule 3  . venlafaxine XR (EFFEXOR-XR) 75 MG 24 hr capsule Take 75 mg by mouth daily with breakfast.     No current facility-administered medications on file prior to visit.   Allergies  Allergen Reactions  . Asa [Aspirin] Hives  . Codeine     Vomit    Social History   Socioeconomic History  . Marital status: Married    Spouse name: Not on file  . Number of children: 0  . Years of education: Not on file  . Highest education level: Not on file  Occupational History  . Occupation: retired  Tobacco Use  . Smoking status: Former Smoker    Years: 6.00    Types: Cigarettes  . Smokeless tobacco: Never Used  Vaping Use  . Vaping Use: Former  Substance and Sexual Activity  . Alcohol use: Yes    Comment: Occasional wine  . Drug use: No  . Sexual activity: Not Currently  Other Topics Concern  . Not on file   Social History Narrative  . Not on file   Social Determinants of Health   Financial Resource Strain:   . Difficulty of Paying Living Expenses: Not on file  Food Insecurity:   . Worried About Charity fundraiser in the Last Year: Not on file  . Ran Out of Food in the Last Year: Not on file  Transportation Needs:   . Lack of Transportation (Medical): Not on file  . Lack of Transportation (Non-Medical): Not on file  Physical Activity:   . Days of Exercise per Week:  Not on file  . Minutes of Exercise per Session: Not on file  Stress:   . Feeling of Stress : Not on file  Social Connections:   . Frequency of Communication with Friends and Family: Not on file  . Frequency of Social Gatherings with Friends and Family: Not on file  . Attends Religious Services: Not on file  . Active Member of Clubs or Organizations: Not on file  . Attends Archivist Meetings: Not on file  . Marital Status: Not on file  Intimate Partner Violence:   . Fear of Current or Ex-Partner: Not on file  . Emotionally Abused: Not on file  . Physically Abused: Not on file  . Sexually Abused: Not on file      Review of Systems  All other systems reviewed and are negative.      Objective:   Physical Exam Vitals reviewed.  Constitutional:      Appearance: She is well-developed. She is obese. She is diaphoretic.  Cardiovascular:     Rate and Rhythm: Tachycardia present. Rhythm irregular.     Heart sounds: Normal heart sounds. No murmur heard.   Pulmonary:     Effort: Pulmonary effort is normal. No respiratory distress.     Breath sounds: Normal breath sounds. No wheezing.  Abdominal:     General: Bowel sounds are normal. There is no distension.     Palpations: Abdomen is soft.     Tenderness: There is no abdominal tenderness. There is no rebound.  Musculoskeletal:     Right lower leg: No edema.     Left lower leg: No edema.  Neurological:     Mental Status: She is alert and oriented to  person, place, and time.     Cranial Nerves: No cranial nerve deficit.     Motor: No abnormal muscle tone.     Coordination: Coordination normal.     Deep Tendon Reflexes: Reflexes are normal and symmetric.           Assessment & Plan:  Severe episode of recurrent major depressive disorder, with psychotic features [facility])  Patient is requesting to meet with the transcranial magnetic stimulation group.  I am certainly willing to allow her to talk to them to see if they can provide her any potential options for treatment.  However in the meantime, I have recommended discontinuation of Abilify and switching to Rexulti 1 mg daily.  Continue her current dose of Effexor along with Provigil.  Follow-up with cardiology as planned as I am concerned that there is underlying coronary artery disease at play.

## 2020-08-09 ENCOUNTER — Ambulatory Visit (INDEPENDENT_AMBULATORY_CARE_PROVIDER_SITE_OTHER): Payer: Medicare HMO

## 2020-08-09 DIAGNOSIS — R072 Precordial pain: Secondary | ICD-10-CM | POA: Diagnosis not present

## 2020-08-09 DIAGNOSIS — R002 Palpitations: Secondary | ICD-10-CM

## 2020-08-13 ENCOUNTER — Other Ambulatory Visit: Payer: Medicare HMO | Admitting: *Deleted

## 2020-08-13 ENCOUNTER — Other Ambulatory Visit: Payer: Self-pay

## 2020-08-13 DIAGNOSIS — R072 Precordial pain: Secondary | ICD-10-CM

## 2020-08-14 ENCOUNTER — Other Ambulatory Visit: Payer: Self-pay | Admitting: Family Medicine

## 2020-08-14 ENCOUNTER — Ambulatory Visit (INDEPENDENT_AMBULATORY_CARE_PROVIDER_SITE_OTHER): Payer: Medicare HMO | Admitting: Family Medicine

## 2020-08-14 VITALS — BP 140/90 | HR 98 | Temp 98.3°F | Ht 70.0 in | Wt 293.0 lb

## 2020-08-14 DIAGNOSIS — R1030 Lower abdominal pain, unspecified: Secondary | ICD-10-CM

## 2020-08-14 LAB — BASIC METABOLIC PANEL
BUN/Creatinine Ratio: 15 (ref 12–28)
BUN: 14 mg/dL (ref 8–27)
CO2: 26 mmol/L (ref 20–29)
Calcium: 9.6 mg/dL (ref 8.7–10.3)
Chloride: 102 mmol/L (ref 96–106)
Creatinine, Ser: 0.95 mg/dL (ref 0.57–1.00)
GFR calc Af Amer: 71 mL/min/{1.73_m2} (ref >59)
GFR calc non Af Amer: 61 mL/min/{1.73_m2} (ref >59)
Glucose: 88 mg/dL (ref 65–99)
Potassium: 5 mmol/L (ref 3.5–5.2)
Sodium: 141 mmol/L (ref 134–144)

## 2020-08-14 MED ORDER — AMOXICILLIN-POT CLAVULANATE 875-125 MG PO TABS: 1.0000 | ORAL_TABLET | Freq: Two times a day (BID) | ORAL | 0 refills | Status: DC

## 2020-08-14 NOTE — Progress Notes (Signed)
Subjective:    Patient ID: Melanie Hurst, female    DOB: June 13, 1951, 69 y.o.   MRN: [id number] 07/10/20 Patient is a very pleasant 69 year old Caucasian female who presents today for follow-up.  At her last visit, she was reporting severe depression.  She states that she felt desperate.  Ultimately we decided to increase her Effexor to 225 mg daily and also increase her Provigil to 400 mg daily.  She states that the depression has improved slightly however I am instantly worried when I see the patient today.  She appears diaphoretic.  She has a prominent essential tremor in both hands and both feet.  She is tachycardic.  She reports feeling extremely short of breath.  I performed an EKG which originally suggested atrial fibrillation.  She did have an irregularly irregular heart rate on exam.  However close inspection of the original EKG shows a P wave before every QRS complex.  The rhythm was irregular however she was having frequent PACs.  These had markedly different PR intervals and also frequent PVCs.  Therefore I believe the computer interpretation is wrong.  I believe she was in normal sinus rhythm with frequent ectopic beats including PVCs and PACs.  I repeated the EKG which did show normal sinus rhythm just a few minutes later.  However on exam, I can auscultate frequent PACs and PVCs.  I can also auscultate occasional 2 or 3 beat runs of tachycardia that suggest SVT.  I believe the patient would benefit from an event monitor to determine the nature of her irregularities and to rule out paroxysmal atrial fibrillation.  However I am concerned by her shortness of breath with activity, her frequent irregular heartbeats, and her diaphoresis.  She is diaphoretic simply walking from her car into the exam room which is less than 120 feet.  At that time, my plan was: I believe the frequent PACs and PVCs are partly due to medication.  Therefore I recommended that we wean off Provigil rapidly.  Decrease Provigil  to 200 mg a day for 1 week and then 100 mg a day thereafter.  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  I also recommended that the patient decrease her Effexor from 225 mg a day to 150 mg a day.  We will begin Toprol-XL 25 mg a day.  Heart rate after sitting in the exam room was 82 bpm.  I will consult her cardiologist for an event monitor given her irregular heart rate and her irregular heartbeats and abnormal EKG to rule out paroxysmal atrial fibrillation.  Also believe that she would benefit from a stress test given her dyspnea on exertion.  08/07/20 Patient has seen cardiology.  They are scheduling the patient for an event monitor.  They are also scheduling her for coronary artery CT.  She continues to have dyspnea on exertion.  However she is here today for severe depression.  She states that she can even leave the home.  She states that she is unable to even sit outside unless she sitting under a canopy because she feels unsafe.  She has no desire to do anything.  She reports anhedonia.  She reports severe depression.  She can even make herself take a bath or perform daily basic hygiene.  She denies any suicidal ideation.  She does report some hallucinations.  She states that she will see dead animals similar to her right heel.  She is fully aware that they are  not present.  She has insight into the hallucinations however she states that she still sees them.  She denies any delusions.  She denies any auditory hallucinations.  She denies any suicidal ideation or homicidal ideation.  At that time, my plan was: Patient is requesting to meet with the transcranial magnetic stimulation group.  I am certainly willing to allow her to talk to them to see if they can provide her any potential options for treatment.  However in the meantime, I have recommended discontinuation of Abilify and switching to Rexulti 1 mg daily.  Continue her current dose of Effexor along with  Provigil.  Follow-up with cardiology as planned as I am concerned that there is underlying coronary artery disease at play.  08/14/20 Patient presents today with right lower quadrant abdominal pain.  The pain is superficial.  She denies any nausea or vomiting.  She denies any fever or chills.  She denies any melena or hematochezia.  She denies any constipation.  She denies any dysuria or hematuria or urgency or frequency.  There is no guarding.  There is no rebound.  There is no evidence of an acute abdomen.  Abdomen is nondistended.  Is only minimally tender to palpation in the area shown on the diagram.  She does have some tenderness to palpation and an aching pain in the left lower quadrant.  There is a palpable nodular subcutaneous area in the right lower quadrant that appears to be fat tissue.  This is a little bit tender to palpation but I do believe this is due to calcified subcutaneous fat (1cm in diameter). Past Medical History:  Diagnosis Date  . Anemia   . Anxiety   . Depression   . Fibromyalgia   . GERD (gastroesophageal reflux disease)   . Hypertension   . IBS (irritable bowel syndrome)   . Obesity   . Scoliosis   . Sleep apnea    cpap at 7 cm  . Tremor, essential    Past Surgical History:  Procedure Laterality Date  . PCOS surgery  1971  . TONSILECTOMY, ADENOIDECTOMY, BILATERAL MYRINGOTOMY AND TUBES  1962   Current Outpatient Medications on File Prior to Visit  Medication Sig Dispense Refill  . ARIPiprazole (ABILIFY) 5 MG tablet Take 1 tablet (5 mg total) by mouth daily. 90 tablet 2  . losartan (COZAAR) 50 MG tablet Take 1 tablet (50 mg total) by mouth 2 (two) times daily. 180 tablet 2  . modafinil (PROVIGIL) 200 MG tablet Take 200 mg by mouth daily.    . pantoprazole (PROTONIX) 40 MG tablet Take 1 tablet (40 mg total) by mouth daily. 90 tablet 3  . rosuvastatin (CRESTOR) 10 MG tablet Take 1 tablet (10 mg total) by mouth daily. 90 tablet 3  . venlafaxine XR (EFFEXOR XR)  150 MG 24 hr capsule Take 1 capsule (150 mg total) by mouth daily with breakfast. Patient will take 225 mg total 90 capsule 3  . venlafaxine XR (EFFEXOR-XR) 75 MG 24 hr capsule Take 75 mg by mouth daily with breakfast.    . brexpiprazole (REXULTI) 1 MG TABS tablet Take 1 tablet (1 mg total) by mouth daily. (Patient not taking: Reported on 08/14/2020) 30 tablet 1  . metoprolol tartrate (LOPRESSOR) 100 MG tablet Take 1 tablet (100 mg total) two hours prior to CT scan. (Patient not taking: Reported on 08/14/2020) 1 tablet 0  . metoprolol tartrate (LOPRESSOR) 25 MG tablet Take 0.5 tablets (12.5 mg total) by mouth 2 (two) times  daily. (Patient not taking: Reported on 08/14/2020) 90 tablet 3  . sulfamethoxazole-trimethoprim (BACTRIM DS) 800-160 MG tablet Take 1 tablet by mouth 2 (two) times daily. 10 tablet 0   No current facility-administered medications on file prior to visit.   Allergies  Allergen Reactions  . Asa [Aspirin] Hives  . Codeine     Vomit    Social History   Socioeconomic History  . Marital status: Married    Spouse name: Not on file  . Number of children: 0  . Years of education: Not on file  . Highest education level: Not on file  Occupational History  . Occupation: retired  Tobacco Use  . Smoking status: Former Smoker    Years: 6.00    Types: Cigarettes  . Smokeless tobacco: Never Used  Vaping Use  . Vaping Use: Former  Substance and Sexual Activity  . Alcohol use: Yes    Comment: Occasional wine  . Drug use: No  . Sexual activity: Not Currently  Other Topics Concern  . Not on file  Social History Narrative  . Not on file   Social Determinants of Health   Financial Resource Strain:   . Difficulty of Paying Living Expenses: Not on file  Food Insecurity:   . Worried About Charity fundraiser in the Last Year: Not on file  . Ran Out of Food in the Last Year: Not on file  Transportation Needs:   . Lack of Transportation (Medical): Not on file  . Lack of  Transportation (Non-Medical): Not on file  Physical Activity:   . Days of Exercise per Week: Not on file  . Minutes of Exercise per Session: Not on file  Stress:   . Feeling of Stress : Not on file  Social Connections:   . Frequency of Communication with Friends and Family: Not on file  . Frequency of Social Gatherings with Friends and Family: Not on file  . Attends Religious Services: Not on file  . Active Member of Clubs or Organizations: Not on file  . Attends Archivist Meetings: Not on file  . Marital Status: Not on file  Intimate Partner Violence:   . Fear of Current or Ex-Partner: Not on file  . Emotionally Abused: Not on file  . Physically Abused: Not on file  . Sexually Abused: Not on file      Review of Systems  All other systems reviewed and are negative.      Objective:   Physical Exam Vitals reviewed.  Constitutional:      Appearance: She is well-developed. She is obese.  Cardiovascular:     Rate and Rhythm: Normal rate. Rhythm irregular.     Heart sounds: Normal heart sounds. No murmur heard.   Pulmonary:     Effort: Pulmonary effort is normal. No respiratory distress.     Breath sounds: Normal breath sounds. No wheezing.  Abdominal:     General: Bowel sounds are normal. There is no distension.     Palpations: Abdomen is soft.     Tenderness: There is abdominal tenderness in the right lower quadrant. There is no rebound.    Musculoskeletal:     Right lower leg: No edema.     Left lower leg: No edema.  Neurological:     Mental Status: She is alert and oriented to person, place, and time.     Cranial Nerves: No cranial nerve deficit.     Motor: No abnormal muscle tone.  Coordination: Coordination normal.     Deep Tendon Reflexes: Reflexes are normal and symmetric.           Assessment & Plan:  Lower abdominal pain  I am suspicious about colitis.  She states that she is previously had diverticulitis and it felt similar and it  resolved quickly on Cipro.  I will start the patient on Augmentin 875 mg p.o. twice daily for 10 days.  I do not feel that this is appendicitis.  The nodular area demonstrated above on the diagram feels like subcutaneous fat and may represent calcified subcutaneous fat but I do not feel that it is a hernia or any type of abscess or infection.

## 2020-08-18 ENCOUNTER — Other Ambulatory Visit: Payer: Self-pay | Admitting: Family Medicine

## 2020-08-19 ENCOUNTER — Telehealth (HOSPITAL_COMMUNITY): Payer: Self-pay | Admitting: Emergency Medicine

## 2020-08-19 NOTE — Telephone Encounter (Signed)
VM box not set up and cannot receive messages  Marchia Bond RN Navigator Cardiac Imaging [facility] [phone] Office  [phone] Cell

## 2020-08-20 ENCOUNTER — Telehealth: Payer: Self-pay | Admitting: Cardiology

## 2020-08-20 NOTE — Telephone Encounter (Signed)
    Pt said she forgot what are the restrictions and instruction to prepare for her CT tomorrow. She would like to get a callback from nurse

## 2020-08-20 NOTE — Telephone Encounter (Signed)
Reviewed the CT instructions with the patient. She verbalized understanding and will call back with any new questions or concerns.

## 2020-08-21 ENCOUNTER — Ambulatory Visit (HOSPITAL_COMMUNITY)
Admission: RE | Admit: 2020-08-21 | Discharge: 2020-08-21 | Disposition: A | Discharge status: Home / Self Care | Payer: Medicare HMO | Source: Ambulatory Visit | Attending: Cardiology | Admitting: Cardiology

## 2020-08-21 ENCOUNTER — Encounter: Payer: Self-pay | Admitting: *Deleted

## 2020-08-21 ENCOUNTER — Encounter (HOSPITAL_COMMUNITY): Payer: Self-pay

## 2020-08-21 ENCOUNTER — Other Ambulatory Visit: Payer: Self-pay

## 2020-08-21 DIAGNOSIS — Z5309 Procedure and treatment not carried out because of other contraindication: Secondary | ICD-10-CM | POA: Diagnosis not present

## 2020-08-21 DIAGNOSIS — Z006 Encounter for examination for normal comparison and control in clinical research program: Secondary | ICD-10-CM

## 2020-08-21 DIAGNOSIS — R072 Precordial pain: Secondary | ICD-10-CM | POA: Diagnosis present

## 2020-08-21 MED ORDER — SODIUM CHLORIDE 0.9 % IV SOLN: Freq: Once | INTRAVENOUS | Status: DC

## 2020-08-21 MED ORDER — DILTIAZEM HCL 25 MG/5ML IV SOLN
INTRAVENOUS | Status: AC
  Filled 2020-08-21: qty 5

## 2020-08-21 MED ORDER — DILTIAZEM HCL 25 MG/5ML IV SOLN
10.0000 mg | Freq: Once | INTRAVENOUS | Status: AC
  Administered 2020-08-21: 10 mg via INTRAVENOUS
  Filled 2020-08-21: qty 5

## 2020-08-21 MED ORDER — IOHEXOL 350 MG/ML SOLN
80.0000 mL | Freq: Once | INTRAVENOUS | Status: AC | PRN
  Administered 2020-08-21: 80 mL via INTRAVENOUS

## 2020-08-21 NOTE — Research (Signed)
IDENTIFY Informed Consent                  Subject Name:   Melanie Hurst   Subject met inclusion and exclusion criteria.  The informed consent form, study requirements and expectations were reviewed with the subject and questions and concerns were addressed prior to the signing of the consent form.  The subject verbalized understanding of the trial requirements.  The subject agreed to participate in the IDENTIFY trial and signed the informed consent.  The informed consent was obtained prior to performance of any protocol-specific procedures for the subject.  A copy of the signed informed consent was given to the subject and a copy was placed in the subject's medical record.   [place] Ahlam Piscitelli, Research Assistant  08/21/2020  10:46 a.m.

## 2020-08-21 NOTE — Progress Notes (Signed)
Pt HR remains elevated.  Notified Dr Meda Coffee, this case is canceled and will be rescheduled with new medication for HR.  Dr Meda Coffee to speak with Marchia Bond RN

## 2020-08-25 ENCOUNTER — Other Ambulatory Visit: Payer: Self-pay

## 2020-08-27 ENCOUNTER — Other Ambulatory Visit: Payer: Self-pay | Admitting: Cardiology

## 2020-08-27 DIAGNOSIS — R072 Precordial pain: Secondary | ICD-10-CM

## 2020-08-27 DIAGNOSIS — R69 Illness, unspecified: Secondary | ICD-10-CM | POA: Diagnosis not present

## 2020-08-27 DIAGNOSIS — F419 Anxiety disorder, unspecified: Secondary | ICD-10-CM | POA: Diagnosis not present

## 2020-08-28 ENCOUNTER — Telehealth (HOSPITAL_COMMUNITY): Payer: Self-pay | Admitting: Emergency Medicine

## 2020-08-28 NOTE — Telephone Encounter (Signed)
Pt answered but then call dropped. Will attempt to call again.  Marchia Bond RN Navigator Cardiac Imaging [facility] [phone] Office  [phone] Cell

## 2020-08-28 NOTE — Telephone Encounter (Signed)
Reaching out to patient to offer assistance regarding upcoming cardiac imaging study; pt verbalizes understanding of appt date/time, parking situation and where to check in, pre-test NPO status and medications ordered, and verified current allergies; name and call back number provided for further questions should they arise Marchia Bond RN Navigator Cardiac Imaging [facility] [phone] office [phone] cell  Pt to take 10mg  corlanor 2 hr prior to scan

## 2020-08-29 ENCOUNTER — Ambulatory Visit (HOSPITAL_COMMUNITY)
Admission: RE | Admit: 2020-08-29 | Discharge: 2020-08-29 | Disposition: A | Discharge status: Home / Self Care | Payer: Medicare HMO | Source: Ambulatory Visit | Attending: Cardiology | Admitting: Cardiology

## 2020-08-29 ENCOUNTER — Encounter (HOSPITAL_COMMUNITY): Payer: Self-pay

## 2020-08-29 ENCOUNTER — Ambulatory Visit (HOSPITAL_COMMUNITY)
Admission: RE | Admit: 2020-08-29 | Payer: Medicare HMO | Source: Ambulatory Visit | Attending: Cardiology | Admitting: Cardiology

## 2020-08-29 ENCOUNTER — Other Ambulatory Visit: Payer: Self-pay

## 2020-08-29 DIAGNOSIS — R072 Precordial pain: Secondary | ICD-10-CM

## 2020-08-29 NOTE — Progress Notes (Signed)
Pt here for second attempt at Coronary  CTA.  Heart rate 62-72 however she has irregular rhythm with frequent PVC, including bigeminy and trigeminy.  Spoke with Dr. Marlou Porch.  Scanned canceled

## 2020-08-30 DIAGNOSIS — R002 Palpitations: Secondary | ICD-10-CM | POA: Diagnosis not present

## 2020-08-30 DIAGNOSIS — R072 Precordial pain: Secondary | ICD-10-CM | POA: Diagnosis not present

## 2020-09-01 ENCOUNTER — Ambulatory Visit (INDEPENDENT_AMBULATORY_CARE_PROVIDER_SITE_OTHER): Payer: Medicare HMO | Admitting: Family Medicine

## 2020-09-01 ENCOUNTER — Encounter: Payer: Self-pay | Admitting: Family Medicine

## 2020-09-01 ENCOUNTER — Other Ambulatory Visit: Payer: Self-pay

## 2020-09-01 ENCOUNTER — Encounter: Payer: Self-pay | Admitting: *Deleted

## 2020-09-01 ENCOUNTER — Telehealth: Payer: Self-pay | Admitting: *Deleted

## 2020-09-01 ENCOUNTER — Other Ambulatory Visit: Payer: Self-pay | Admitting: Family Medicine

## 2020-09-01 VITALS — BP 128/68 | HR 82 | Temp 98.1°F | Resp 14 | Ht 70.0 in | Wt 293.0 lb

## 2020-09-01 DIAGNOSIS — Z23 Encounter for immunization: Secondary | ICD-10-CM

## 2020-09-01 DIAGNOSIS — S91312A Laceration without foreign body, left foot, initial encounter: Secondary | ICD-10-CM | POA: Diagnosis not present

## 2020-09-01 DIAGNOSIS — R072 Precordial pain: Secondary | ICD-10-CM

## 2020-09-01 MED ORDER — CEPHALEXIN 250 MG PO CAPS: 250.0000 mg | ORAL_CAPSULE | Freq: Two times a day (BID) | ORAL | 0 refills | Status: DC

## 2020-09-01 NOTE — Progress Notes (Signed)
   Subjective:    Patient ID: Melanie Hurst, female    DOB: Jul 23, 1951, 69 y.o.   MRN: [id number]  Patient presents for Laceration (x1 day- stepped on glass- small laceration to L heel- needs TDaP)  Patient here with laceration to her left heel here yesterday she was in her kitchen where a butter  dish fell off the counter and practice a large pieces.  She went to go play had quickly but did not have shoes on and stepped on a piece of glass.  She does state that she has removed it although still has discomfort when she walks.  She had a significant amount of bleeding initially but that has slowed down.  She is not had any fever pain she is not noted any red streaking.  She does have history of cellulitis. TDAP due   Review Of Systems:  GEN- denies fatigue, fever, weight loss,weakness, recent illness CVS- denies chest pain, palpitations RESP- denies SOB, cough, wheeze MSK-+ joint pain, muscle aches, injury        Objective:    BP 128/68   Pulse 82   Temp 98.1 F (36.7 C) (Temporal)   Resp 14   Ht 5\' 10"  (1.778 m)   Wt 293 lb (132.9 kg)   SpO2 97%   BMI 42.04 kg/m  GEN- NAD, alert and oriented x3 EXT- Mild ankle edema, left foot callus heels/soles with cracks , long nails , heel small  < 0.5cm laceration minimal bleeding  irrigated with saline and probed with forceps no glass found  Pulses- Radial, DP- palpated         Assessment & Plan:      Problem List Items Addressed This Visit    None    Visit Diagnoses    Laceration of left foot, initial encounter    -  Primary   TDAP Given for treatment of laceration, antibiotic ointment applied, history of cellulitis in lower ext from cuts/ etc given keflex for 5 days low dose  Nothing to suture    Relevant Orders   Tdap vaccine greater than or equal to 7yo IM (Completed)   Need for tetanus, diphtheria, and acellular pertussis (Tdap) vaccine in patient of adolescent age or older       Relevant Orders   Tdap vaccine greater  than or equal to 7yo IM (Completed)      Note: This dictation was prepared with Dragon dictation along with smaller Company secretary. Any transcriptional errors that result from this process are unintentional.

## 2020-09-01 NOTE — Telephone Encounter (Signed)
Patient notified.  I verbally went over [place] instructions with patient and will send through my chart

## 2020-09-01 NOTE — Patient Instructions (Signed)
Tetatnus shot given Take antibiotics as prescribed F/U as needed

## 2020-09-01 NOTE — Telephone Encounter (Signed)
-----   Message from Thompson Grayer, RN sent at 09/01/2020  8:08 AM EST -----  ----- Message ----- From: Freada Bergeron, MD Sent: 08/30/2020   7:40 AM EST To: [facility] Triage  Do you mind setting up a lexiscan myoview for this patient. She has been unable to get the CTA.  Thank you so much!!

## 2020-09-02 ENCOUNTER — Other Ambulatory Visit: Payer: Self-pay | Admitting: Cardiology

## 2020-09-02 ENCOUNTER — Telehealth (HOSPITAL_COMMUNITY): Payer: Self-pay | Admitting: *Deleted

## 2020-09-02 ENCOUNTER — Encounter (HOSPITAL_COMMUNITY): Payer: Self-pay | Admitting: *Deleted

## 2020-09-02 DIAGNOSIS — R072 Precordial pain: Secondary | ICD-10-CM

## 2020-09-02 DIAGNOSIS — R002 Palpitations: Secondary | ICD-10-CM

## 2020-09-02 NOTE — Telephone Encounter (Signed)
Patient given detailed instructions per Myocardial Perfusion Study Information Sheet for the test on 09/10/2020 at 1300. Patient notified to arrive 15 minutes early and that it is imperative to arrive on time for appointment to keep from having the test rescheduled.  If you need to cancel or reschedule your appointment, please call the office within 24 hours of your appointment. . Patient verbalized understanding.[facility] Mychart letter sent with instructions.

## 2020-09-09 DIAGNOSIS — Z1231 Encounter for screening mammogram for malignant neoplasm of breast: Secondary | ICD-10-CM | POA: Diagnosis not present

## 2020-09-10 ENCOUNTER — Ambulatory Visit (HOSPITAL_COMMUNITY): Payer: Medicare HMO | Attending: Cardiology

## 2020-09-10 ENCOUNTER — Other Ambulatory Visit: Payer: Self-pay

## 2020-09-10 DIAGNOSIS — R072 Precordial pain: Secondary | ICD-10-CM | POA: Diagnosis not present

## 2020-09-10 MED ORDER — REGADENOSON 0.4 MG/5ML IV SOLN
0.4000 mg | Freq: Once | INTRAVENOUS | Status: AC
  Administered 2020-09-10: 0.4 mg via INTRAVENOUS

## 2020-09-10 MED ORDER — TECHNETIUM TC 99M TETROFOSMIN IV KIT
31.8000 | PACK | Freq: Once | INTRAVENOUS | Status: AC | PRN
  Administered 2020-09-10: 31.8 via INTRAVENOUS
  Filled 2020-09-10: qty 32

## 2020-09-11 ENCOUNTER — Ambulatory Visit (HOSPITAL_COMMUNITY): Payer: Medicare HMO | Attending: Cardiology

## 2020-09-11 LAB — MYOCARDIAL PERFUSION IMAGING
LV dias vol: 166 mL (ref 46–106)
LV sys vol: 100 mL
Peak HR: 77 {beats}/min
Rest HR: 80 {beats}/min
SDS: 1
SRS: 2
SSS: 3
TID: 1.16

## 2020-09-11 MED ORDER — TECHNETIUM TC 99M TETROFOSMIN IV KIT
31.6000 | PACK | Freq: Once | INTRAVENOUS | Status: AC | PRN
  Administered 2020-09-11: 31.6 via INTRAVENOUS
  Filled 2020-09-11: qty 32

## 2020-09-13 ENCOUNTER — Other Ambulatory Visit: Payer: Self-pay | Admitting: Family Medicine

## 2020-09-16 ENCOUNTER — Ambulatory Visit (INDEPENDENT_AMBULATORY_CARE_PROVIDER_SITE_OTHER): Payer: Medicare HMO | Admitting: Family Medicine

## 2020-09-16 ENCOUNTER — Encounter: Payer: Self-pay | Admitting: Family Medicine

## 2020-09-16 ENCOUNTER — Ambulatory Visit
Admission: RE | Admit: 2020-09-16 | Discharge: 2020-09-16 | Disposition: A | Discharge status: Home / Self Care | Payer: Medicare HMO | Source: Ambulatory Visit | Attending: Family Medicine | Admitting: Family Medicine

## 2020-09-16 ENCOUNTER — Other Ambulatory Visit: Payer: Self-pay | Admitting: Family Medicine

## 2020-09-16 ENCOUNTER — Other Ambulatory Visit: Payer: Self-pay

## 2020-09-16 VITALS — BP 152/90 | HR 103 | Temp 97.8°F | Resp 19 | Ht 70.0 in | Wt 292.0 lb

## 2020-09-16 DIAGNOSIS — M19072 Primary osteoarthritis, left ankle and foot: Secondary | ICD-10-CM | POA: Diagnosis not present

## 2020-09-16 DIAGNOSIS — S99922A Unspecified injury of left foot, initial encounter: Secondary | ICD-10-CM

## 2020-09-16 MED ORDER — DOXYCYCLINE HYCLATE 100 MG PO TABS: 100.0000 mg | ORAL_TABLET | Freq: Two times a day (BID) | ORAL | 0 refills | Status: DC

## 2020-09-16 NOTE — Patient Instructions (Addendum)
Take doxycycline 100mg  twice a day for 3 more days  Get xray [place]  100

## 2020-09-16 NOTE — Progress Notes (Signed)
   Subjective:    Patient ID: Melanie Hurst, female    DOB: 24-May-1951, 69 y.o.   MRN: [id number]  Patient presents for Foot Pain (pt complains of pain in left foot starting back thursday, has had this treated once already, swollen and red, took left over abx reports better but knows she needs differnt to treat it completely )  Patient here with recurrent infections in her left great toe.  She initially had an injury back in October when her dog stepped on her foot was concern for cellulitis at that time.  She was treated with oral antibiotics and her symptoms improved.  She did came back about 4 weeks later she sustained a laceration to her left foot after stepping on pieces from a broken dish.  She was treated with Keflex at that visit along with tetanus. He states that it improved but approximately a week ago she started having significant redness and warmth and pain to her left great toe again.  She is concerned infection is deeper into the toe.  She has not had any fever chills no nausea vomiting no other joint pain involved.  She is also leftover doxycycline and start taking that she is taking for days of that. She Has noted significant improvement in the redness and the pain has gone down as well.  Review Of Systems:  GEN- denies fatigue, fever, weight loss,weakness, recent illness HEENT- denies eye drainage, change in vision, nasal discharge, CVS- denies chest pain, palpitations RESP- denies SOB, cough, wheeze ABD- denies N/V, change in stools, abd pain GU- denies dysuria, hematuria, dribbling, incontinence MSK- + joint pain, muscle aches, injury Neuro- denies headache, dizziness, syncope, seizure activity       Objective:    BP (!) 152/90   Pulse (!) 103   Temp 97.8 F (36.6 C) (Temporal)   Resp 19   Ht 5\' 10"  (1.778 m)   Wt 292 lb (132.5 kg)   SpO2 94%   BMI 41.90 kg/m  GEN- NAD, alert and oriented x3 RESP-normal WOB  EXT- trace  ankle edema, left foot callus heels/soles  with cracks , long nails ,Left great toe- nail is lifting from nailbed at base, no drainage noted, TTP base of great toe , no warmth , minimal erythema  Feet actually cool to touch Pulses- Radial, DP- palpated         Assessment & Plan:      Problem List Items Addressed This Visit    None    Visit Diagnoses    Injury of left great toe ? cellulitis    -  Primary   recurrent infection and pain in same toe,obtan xray, add 3 more days of doxycycline, partial nail avulsion noted, may fall off but will not remove at this time, elevate foot If xray is normal and continues to have issues, recommend podiatry referral , may need venous studies done as well   Relevant Orders   DG Foot Complete Left      Note: This dictation was prepared with Dragon dictation along with smaller phrase technology. Any transcriptional errors that result from this process are unintentional.

## 2020-09-18 ENCOUNTER — Ambulatory Visit: Payer: Medicare HMO | Admitting: Family Medicine

## 2020-09-19 ENCOUNTER — Other Ambulatory Visit: Payer: Self-pay

## 2020-10-06 DIAGNOSIS — F419 Anxiety disorder, unspecified: Secondary | ICD-10-CM | POA: Diagnosis not present

## 2020-10-06 DIAGNOSIS — Z79891 Long term (current) use of opiate analgesic: Secondary | ICD-10-CM | POA: Diagnosis not present

## 2020-10-06 DIAGNOSIS — R69 Illness, unspecified: Secondary | ICD-10-CM | POA: Diagnosis not present

## 2020-10-07 DIAGNOSIS — R69 Illness, unspecified: Secondary | ICD-10-CM | POA: Diagnosis not present

## 2020-10-07 DIAGNOSIS — F419 Anxiety disorder, unspecified: Secondary | ICD-10-CM | POA: Diagnosis not present

## 2020-10-13 MED ORDER — MODAFINIL 200 MG PO TABS
200.0000 mg | ORAL_TABLET | Freq: Every day | ORAL | 0 refills | Status: DC
Start: 2020-10-13 — End: 2020-11-21

## 2020-10-14 DIAGNOSIS — F419 Anxiety disorder, unspecified: Secondary | ICD-10-CM | POA: Diagnosis not present

## 2020-10-14 DIAGNOSIS — R69 Illness, unspecified: Secondary | ICD-10-CM | POA: Diagnosis not present

## 2020-10-21 DIAGNOSIS — F419 Anxiety disorder, unspecified: Secondary | ICD-10-CM | POA: Diagnosis not present

## 2020-10-21 DIAGNOSIS — R69 Illness, unspecified: Secondary | ICD-10-CM | POA: Diagnosis not present

## 2020-11-04 ENCOUNTER — Other Ambulatory Visit: Payer: Self-pay

## 2020-11-04 ENCOUNTER — Ambulatory Visit (INDEPENDENT_AMBULATORY_CARE_PROVIDER_SITE_OTHER): Payer: Medicare HMO | Admitting: Family Medicine

## 2020-11-04 VITALS — BP 144/82 | HR 98 | Temp 98.6°F | Ht 70.0 in | Wt 292.0 lb

## 2020-11-04 DIAGNOSIS — R0600 Dyspnea, unspecified: Secondary | ICD-10-CM | POA: Diagnosis not present

## 2020-11-04 DIAGNOSIS — Z136 Encounter for screening for cardiovascular disorders: Secondary | ICD-10-CM | POA: Diagnosis not present

## 2020-11-04 DIAGNOSIS — Z13220 Encounter for screening for lipoid disorders: Secondary | ICD-10-CM | POA: Diagnosis not present

## 2020-11-04 MED ORDER — VALSARTAN 320 MG PO TABS: 320.0000 mg | ORAL_TABLET | Freq: Every day | ORAL | 3 refills | Status: DC

## 2020-11-04 NOTE — Progress Notes (Signed)
Subjective:    Patient ID: Melanie Hurst, female    DOB: 19-Sep-1951, 70 y.o.   MRN: [id number] 07/10/20 Patient is a very pleasant 70 year old Caucasian female who presents today for follow-up.  At her last visit, she was reporting severe depression.  She states that she felt desperate.  Ultimately we decided to increase her Effexor to 225 mg daily and also increase her Provigil to 400 mg daily.  She states that the depression has improved slightly however I am instantly worried when I see the patient today.  She appears diaphoretic.  She has a prominent essential tremor in both hands and both feet.  She is tachycardic.  She reports feeling extremely short of breath.  I performed an EKG which originally suggested atrial fibrillation.  She did have an irregularly irregular heart rate on exam.  However close inspection of the original EKG shows a P wave before every QRS complex.  The rhythm was irregular however she was having frequent PACs.  These had markedly different PR intervals and also frequent PVCs.  Therefore I believe the computer interpretation is wrong.  I believe she was in normal sinus rhythm with frequent ectopic beats including PVCs and PACs.  I repeated the EKG which did show normal sinus rhythm just a few minutes later.  However on exam, I can auscultate frequent PACs and PVCs.  I can also auscultate occasional 2 or 3 beat runs of tachycardia that suggest SVT.  I believe the patient would benefit from an event monitor to determine the nature of her irregularities and to rule out paroxysmal atrial fibrillation.  However I am concerned by her shortness of breath with activity, her frequent irregular heartbeats, and her diaphoresis.  She is diaphoretic simply walking from her car into the exam room which is less than 120 feet.  At that time, my plan was: I believe the frequent PACs and PVCs are partly due to medication.  Therefore I recommended that we wean off Provigil rapidly.  Decrease Provigil  to 200 mg a day for 1 week and then 100 mg a day thereafter.  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  I also recommended that the patient decrease her Effexor from 225 mg a day to 150 mg a day.  We will begin Toprol-XL 25 mg a day.  Heart rate after sitting in the exam room was 82 bpm.  I will consult her cardiologist for an event monitor given her irregular heart rate and her irregular heartbeats and abnormal EKG to rule out paroxysmal atrial fibrillation.  Also believe that she would benefit from a stress test given her dyspnea on exertion.  11/04/20 Patient saw cardiology and had a stress test that was normal.  Therefore ischemia does not appear to be the root cause of her dyspnea on exertion.  Ejection fraction was 55 to 60% on the stress test.  She has not had a chest x-ray.  She has not had a CBC.  She has not had pulmonary function test.  She does have a history of smoking off and on since her 34s however she never smoked a pack a day consistently.  She denies any cough or hemoptysis or fever or chills.  She had to stop metoprolol because it caused her to feel too sleepy.  She is taking losartan 50 mg twice a day however his systolic blood pressure remains in the 140 range. Past Medical History:  Diagnosis Date  .  Anemia   . Anxiety   . Depression   . Fibromyalgia   . GERD (gastroesophageal reflux disease)   . Hypertension   . IBS (irritable bowel syndrome)   . Obesity   . Scoliosis   . Sleep apnea    cpap at 7 cm  . Tremor, essential    Past Surgical History:  Procedure Laterality Date  . PCOS surgery  1971  . TONSILECTOMY, ADENOIDECTOMY, BILATERAL MYRINGOTOMY AND TUBES  1962   Current Outpatient Medications on File Prior to Visit  Medication Sig Dispense Refill  . losartan (COZAAR) 50 MG tablet TAKE 1 TABLET TWICE A DAY 180 tablet 2  . modafinil (PROVIGIL) 200 MG tablet Take 1 tablet (200 mg total) by mouth daily. 30 tablet 0  .  pantoprazole (PROTONIX) 40 MG tablet Take 1 tablet (40 mg total) by mouth daily. 90 tablet 3  . venlafaxine XR (EFFEXOR XR) 150 MG 24 hr capsule Take 1 capsule (150 mg total) by mouth daily with breakfast. Patient will take 225 mg total 90 capsule 3  . venlafaxine XR (EFFEXOR-XR) 75 MG 24 hr capsule Take 75 mg by mouth daily with breakfast.    . metoprolol tartrate (LOPRESSOR) 25 MG tablet Take 0.5 tablets (12.5 mg total) by mouth 2 (two) times daily. (Patient not taking: Reported on 11/04/2020) 90 tablet 3  . rosuvastatin (CRESTOR) 10 MG tablet Take 1 tablet (10 mg total) by mouth daily. (Patient not taking: Reported on 11/04/2020) 90 tablet 3   No current facility-administered medications on file prior to visit.   Allergies  Allergen Reactions  . Asa [Aspirin] Hives  . Codeine     Vomit    Social History   Socioeconomic History  . Marital status: Married    Spouse name: Not on file  . Number of children: 0  . Years of education: Not on file  . Highest education level: Not on file  Occupational History  . Occupation: retired  Tobacco Use  . Smoking status: Former Smoker    Years: 6.00    Types: Cigarettes  . Smokeless tobacco: Never Used  Vaping Use  . Vaping Use: Former  Substance and Sexual Activity  . Alcohol use: Yes    Comment: Occasional wine  . Drug use: No  . Sexual activity: Not Currently  Other Topics Concern  . Not on file  Social History Narrative  . Not on file   Social Determinants of Health   Financial Resource Strain: Not on file  Food Insecurity: Not on file  Transportation Needs: Not on file  Physical Activity: Not on file  Stress: Not on file  Social Connections: Not on file  Intimate Partner Violence: Not on file      Review of Systems  All other systems reviewed and are negative.      Objective:   Physical Exam Vitals reviewed.  Constitutional:      Appearance: She is well-developed. She is obese. She is not diaphoretic.   Cardiovascular:     Rate and Rhythm: Normal rate and regular rhythm.     Heart sounds: Normal heart sounds. No murmur heard.   Pulmonary:     Effort: Pulmonary effort is normal. No respiratory distress.     Breath sounds: Normal breath sounds. No wheezing.  Abdominal:     General: Bowel sounds are normal. There is no distension.     Palpations: Abdomen is soft.     Tenderness: There is no abdominal tenderness. There is no  rebound.  Musculoskeletal:     Right lower leg: No edema.     Left lower leg: No edema.  Neurological:     Mental Status: She is alert and oriented to person, place, and time.     Cranial Nerves: No cranial nerve deficit.     Motor: No abnormal muscle tone.     Coordination: Coordination normal.     Deep Tendon Reflexes: Reflexes are normal and symmetric.           Assessment & Plan:  Dyspnea, unspecified type - Plan: DG Chest 2 View, CBC with Differential/Platelet, COMPLETE METABOLIC PANEL WITH GFR, Lipid panel, D-dimer, quantitative (not at [facility]) Blood pressure is not quite at goal.  Discontinue losartan and replace with valsartan 320 mg daily to see if this will afford better 24-hour control.  Patient stop metoprolol and Crestor.  Recheck blood pressure on valsartan in 3 to 4 weeks.  If still elevated I will try hydrochlorothiazide 12.5 mg daily.  Regarding her dyspnea on exertion I suspect deconditioning.  However I will obtain a chest x-ray, CBC, D-dimer.  If D-dimer is elevated I will obtain a CT scan of the lungs.  If lab work is unremarkable and chest x-ray is unremarkable, the next step would be referral to pulmonology for pulmonary function test.

## 2020-11-05 LAB — CBC WITH DIFFERENTIAL/PLATELET
Absolute Monocytes: 502 cells/uL (ref 200–950)
Basophils Absolute: 102 cells/uL (ref 0–200)
Basophils Relative: 1.2 %
Eosinophils Absolute: 119 cells/uL (ref 15–500)
Eosinophils Relative: 1.4 %
HCT: 41.4 % (ref 35.0–45.0)
Hemoglobin: 13.3 g/dL (ref 11.7–15.5)
Lymphs Abs: 2661 cells/uL (ref 850–3900)
MCH: 26.8 pg — ABNORMAL LOW (ref 27.0–33.0)
MCHC: 32.1 g/dL (ref 32.0–36.0)
MCV: 83.5 fL (ref 80.0–100.0)
MPV: 9.6 fL (ref 7.5–12.5)
Monocytes Relative: 5.9 %
Neutro Abs: 5117 cells/uL (ref 1500–7800)
Neutrophils Relative %: 60.2 %
Platelets: 352 10*3/uL (ref 140–400)
RBC: 4.96 10*6/uL (ref 3.80–5.10)
RDW: 14.5 % (ref 11.0–15.0)
Total Lymphocyte: 31.3 %
WBC: 8.5 10*3/uL (ref 3.8–10.8)

## 2020-11-05 LAB — LIPID PANEL
Cholesterol: 197 mg/dL (ref <200)
HDL: 43 mg/dL — ABNORMAL LOW (ref >=50)
LDL Cholesterol (Calc): 125 mg/dL (calc) — ABNORMAL HIGH
Non-HDL Cholesterol (Calc): 154 mg/dL (calc) — ABNORMAL HIGH (ref <130)
Total CHOL/HDL Ratio: 4.6 (calc) (ref <5.0)
Triglycerides: 169 mg/dL — ABNORMAL HIGH (ref <150)

## 2020-11-05 LAB — COMPLETE METABOLIC PANEL WITH GFR
AG Ratio: 1.9 (calc) (ref 1.0–2.5)
ALT: 7 U/L (ref 6–29)
AST: 11 U/L (ref 10–35)
Albumin: 4.3 g/dL (ref 3.6–5.1)
Alkaline phosphatase (APISO): 109 U/L (ref 37–153)
BUN: 11 mg/dL (ref 7–25)
CO2: 27 mmol/L (ref 20–32)
Calcium: 10.2 mg/dL (ref 8.6–10.4)
Chloride: 105 mmol/L (ref 98–110)
Creat: 0.96 mg/dL (ref 0.50–0.99)
GFR, Est African American: 70 mL/min/{1.73_m2} (ref >=60)
GFR, Est Non African American: 60 mL/min/{1.73_m2} (ref >=60)
Globulin: 2.3 g/dL (calc) (ref 1.9–3.7)
Glucose, Bld: 93 mg/dL (ref 65–99)
Potassium: 4.8 mmol/L (ref 3.5–5.3)
Sodium: 142 mmol/L (ref 135–146)
Total Bilirubin: 0.7 mg/dL (ref 0.2–1.2)
Total Protein: 6.6 g/dL (ref 6.1–8.1)

## 2020-11-05 LAB — D-DIMER, QUANTITATIVE: D-Dimer, Quant: 0.19 mcg/mL FEU (ref <0.50)

## 2020-11-10 ENCOUNTER — Ambulatory Visit: Payer: Medicare HMO | Admitting: Cardiology

## 2020-11-18 ENCOUNTER — Ambulatory Visit
Admission: RE | Admit: 2020-11-18 | Discharge: 2020-11-18 | Disposition: A | Discharge status: Home / Self Care | Payer: Medicare HMO | Source: Ambulatory Visit | Attending: Family Medicine | Admitting: Family Medicine

## 2020-11-18 DIAGNOSIS — R0602 Shortness of breath: Secondary | ICD-10-CM | POA: Diagnosis not present

## 2020-11-18 DIAGNOSIS — R0600 Dyspnea, unspecified: Secondary | ICD-10-CM

## 2020-11-21 ENCOUNTER — Other Ambulatory Visit: Payer: Self-pay

## 2020-11-21 MED ORDER — MODAFINIL 200 MG PO TABS
200.0000 mg | ORAL_TABLET | Freq: Every day | ORAL | 0 refills | Status: DC
Start: 2020-11-21 — End: 2020-12-02

## 2020-12-02 ENCOUNTER — Other Ambulatory Visit: Payer: Self-pay | Admitting: *Deleted

## 2020-12-02 MED ORDER — PANTOPRAZOLE SODIUM 40 MG PO TBEC
40.0000 mg | DELAYED_RELEASE_TABLET | Freq: Every day | ORAL | 3 refills | Status: DC
Start: 2020-12-02 — End: 2021-09-11

## 2020-12-02 NOTE — Telephone Encounter (Signed)
Received call from patient.   Requested refill on Provigil to mail order.   Ok to refill?

## 2020-12-04 MED ORDER — MODAFINIL 200 MG PO TABS
200.0000 mg | ORAL_TABLET | Freq: Every day | ORAL | 0 refills | Status: DC
Start: 2020-12-04 — End: 2020-12-09

## 2020-12-09 ENCOUNTER — Other Ambulatory Visit: Payer: Self-pay

## 2020-12-09 MED ORDER — MODAFINIL 200 MG PO TABS
200.0000 mg | ORAL_TABLET | Freq: Every day | ORAL | 0 refills | Status: DC
Start: 2020-12-09 — End: 2021-02-12

## 2020-12-11 ENCOUNTER — Telehealth: Payer: Self-pay | Admitting: *Deleted

## 2020-12-11 NOTE — Telephone Encounter (Signed)
Received PA determination.   PA approved 10/11/2020- 10/10/2021 

## 2020-12-11 NOTE — Telephone Encounter (Signed)
Received request from pharmacy for PA on Provigil.  PA submitted.   Dx: G47.19- excessive daytime sleepiness, G47.33- OSA on CPAP.  Your information has been submitted to [place] [employer]. [employer] will review the request and will issue a decision, typically within 1-3 days from your submission. You can check the updated outcome later by reopening this request.  If [employer] has not responded in 1-3 days or if you have any questions about your ePA request, please contact [place] [employer] at [phone].

## 2020-12-30 ENCOUNTER — Telehealth: Payer: Self-pay | Admitting: *Deleted

## 2020-12-30 DIAGNOSIS — Z006 Encounter for examination for normal comparison and control in clinical research program: Secondary | ICD-10-CM

## 2020-12-30 NOTE — Telephone Encounter (Signed)
I called patient for 90-day Identify Study visit. Patient was uanble to have Cardiac CT due to elevated heart rate Patient did have stress test which was negative. Patient is still having extreme shortness of breath at rest and with exertion.

## 2021-01-15 ENCOUNTER — Encounter: Payer: Self-pay | Admitting: Family Medicine

## 2021-01-15 DIAGNOSIS — R0600 Dyspnea, unspecified: Secondary | ICD-10-CM

## 2021-01-27 ENCOUNTER — Other Ambulatory Visit: Payer: Self-pay | Admitting: Family Medicine

## 2021-02-02 ENCOUNTER — Encounter: Payer: Self-pay | Admitting: Family Medicine

## 2021-02-12 ENCOUNTER — Encounter: Payer: Self-pay | Admitting: Family Medicine

## 2021-02-12 MED ORDER — MODAFINIL 200 MG PO TABS
200.0000 mg | ORAL_TABLET | Freq: Every day | ORAL | 0 refills | Status: DC
Start: 2021-02-12 — End: 2021-07-07

## 2021-02-12 NOTE — Telephone Encounter (Signed)
Ok to refill??  Last office visit 11/04/2020.  Last refill 12/09/2020.

## 2021-03-12 ENCOUNTER — Telehealth: Payer: Self-pay | Admitting: Family Medicine

## 2021-03-12 NOTE — Chronic Care Management (AMB) (Signed)
  Chronic Care Management   Note  03/12/2021 Name: Mackena Plummer MRN: [id number] DOB: 12/22/1950  Mikaia Janvier is a 70 y.o. year old female who is a primary care patient of Dennard Schaumann, Cammie Mcgee, MD. I reached out to Jamisha Hoeschen by phone today in response to a referral sent by Ms. Solon [place] PCP, [name], MD.   Ms. Jeon was given information about Chronic Care Management services today including:  1. CCM service includes personalized support from designated clinical staff supervised by her physician, including individualized plan of care and coordination with other care providers 2. 24/7 contact phone numbers for assistance for urgent and routine care needs. 3. Service will only be billed when office clinical staff spend 20 minutes or more in a month to coordinate care. 4. Only one practitioner may furnish and bill the service in a calendar month. 5. The patient may stop CCM services at any time (effective at the end of the month) by phone call to the office staff.   Patient agreed to services and verbal consent obtained.   Follow up plan:   Tatjana Secretary/administrator

## 2021-03-19 ENCOUNTER — Other Ambulatory Visit: Payer: Self-pay

## 2021-03-23 ENCOUNTER — Ambulatory Visit: Payer: Medicare HMO | Admitting: Pulmonary Disease

## 2021-03-23 ENCOUNTER — Encounter: Payer: Self-pay | Admitting: Pulmonary Disease

## 2021-03-23 ENCOUNTER — Other Ambulatory Visit: Payer: Self-pay

## 2021-03-23 VITALS — BP 130/50 | HR 106 | Temp 97.3°F | Ht 68.0 in | Wt 285.8 lb

## 2021-03-23 DIAGNOSIS — R0602 Shortness of breath: Secondary | ICD-10-CM

## 2021-03-23 MED ORDER — TRELEGY ELLIPTA 200-62.5-25 MCG/INH IN AEPB: 1.0000 | INHALATION_SPRAY | Freq: Every day | RESPIRATORY_TRACT | 0 refills | Status: DC

## 2021-03-23 NOTE — Progress Notes (Signed)
Melanie Hurst    [id number]    Oct 31, 1950  Primary Care Physician:Pickard, Cammie Mcgee, MD  Referring Physician: [name], MD [place],  [place] [address]  Chief complaint: Consult for dyspnea  HPI: 70 year old with history of hypertension, hyperlipidemia, sleep apnea, kyphoscoliosis Complains of progressive dyspnea on exertion for the past 6 years Reports that breathing has gotten worse to the point where she is gasping for breath while walking across the room.  She has occasional symptoms at rest Denies any cough, wheezing, sputum production, chest congestion  She had a echocardiogram in 2018 which was unremarkable.  Her primary care discussed referral to pulmonary at that point but she had held off but symptoms have progressed to the point where she has requested an evaluation She was given albuterol by primary care with no improvement in symptoms  She was told as a child that she may have cystic fibrosis but not confirmed.  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  No feather pillows or comforters Smoking history: Smoked intermittently since college years.  Quit around 2012.  She vaped for a year or 2 around 2012 Travel history: Previously lived in [place] Relevant family history: No significant family history of lung disease  Outpatient Encounter Medications as of 03/23/2021  Medication Sig   losartan (COZAAR) 50 MG tablet TAKE 1 TABLET TWICE A DAY   modafinil (PROVIGIL) 200 MG tablet Take 1 tablet (200 mg total) by mouth daily.   pantoprazole (PROTONIX) 40 MG tablet Take 1 tablet (40 mg total) by mouth daily.   venlafaxine XR (EFFEXOR XR) 150 MG 24 hr capsule Take 1 capsule (150 mg total) by mouth daily with breakfast. Patient will take 225 mg total   venlafaxine XR (EFFEXOR-XR)  75 MG 24 hr capsule TAKE 1 CAPSULE EVERY       MORNING AND AT BEDTIME   metoprolol tartrate (LOPRESSOR) 25 MG tablet Take 0.5 tablets (12.5 mg total) by mouth 2 (two) times daily. (Patient not taking: Reported on 11/04/2020)   rosuvastatin (CRESTOR) 10 MG tablet Take 1 tablet (10 mg total) by mouth daily. (Patient not taking: Reported on 11/04/2020)   valsartan (DIOVAN) 320 MG tablet Take 1 tablet (320 mg total) by mouth daily.   No facility-administered encounter medications on file as of 03/23/2021.    Allergies as of 03/23/2021 - Review Complete 03/23/2021  Allergen Reaction Noted   Asa [aspirin] Hives 08/19/2017   Codeine  03/15/2013    Past Medical History:  Diagnosis Date   Anemia    Anxiety    Depression    Fibromyalgia    GERD (gastroesophageal reflux disease)    Hypertension    IBS (irritable bowel syndrome)    Obesity    Scoliosis    Sleep apnea    cpap at 7 cm   Tremor, essential     Past Surgical History:  Procedure Laterality Date   PCOS surgery  1971   TONSILECTOMY, ADENOIDECTOMY, BILATERAL MYRINGOTOMY AND TUBES  1962    Family History  Problem Relation Age of Onset   Melanoma Mother        wrist   Other Mother        brain tumor   Stroke Mother    Colon polyps Father  not cancerous   Kidney failure Father    Breast cancer Sister    Breast cancer Paternal Aunt    Colon cancer Paternal Aunt    Irritable bowel syndrome Paternal Aunt    Stomach cancer Maternal Uncle    Esophageal cancer Neg Hx    Rectal cancer Neg Hx     Social History   Socioeconomic History   Marital status: Married    Spouse name: Not on file   Number of children: 0   Years of education: Not on file   Highest education level: Not on file  Occupational History   Occupation: retired  Tobacco Use   Smoking status: Former    Years: 6.00    Pack years: 0.00    Types: Cigarettes    Quit date: 10/11/2009    Years since quitting: 11.4   Smokeless tobacco: Never  Vaping  Use   Vaping Use: Former   Devices: vaped to stop smoking  Substance and Sexual Activity   Alcohol use: Yes    Comment: Occasional wine   Drug use: No   Sexual activity: Not Currently  Other Topics Concern   Not on file  Social History Narrative   Not on file   Social Determinants of Health   Financial Resource Strain: Not on file  Food Insecurity: Not on file  Transportation Needs: Not on file  Physical Activity: Not on file  Stress: Not on file  Social Connections: Not on file  Intimate Partner Violence: Not on file    Review of systems: Review of Systems  Constitutional: Negative for fever and chills.  HENT: Negative.   Eyes: Negative for blurred vision.  Respiratory: as per HPI  Cardiovascular: Negative for chest pain and palpitations.  Gastrointestinal: Negative for vomiting, diarrhea, blood per rectum. Genitourinary: Negative for dysuria, urgency, frequency and hematuria.  Musculoskeletal: Negative for myalgias, back pain and joint pain.  Skin: Negative for itching and rash.  Neurological: Negative for dizziness, tremors, focal weakness, seizures and loss of consciousness.  Endo/Heme/Allergies: Negative for environmental allergies.  Psychiatric/Behavioral: Negative for depression, suicidal ideas and hallucinations.  All other systems reviewed and are negative.  Physical Exam: Blood pressure (!) 130/50, pulse (!) 106, temperature (!) 97.3 F (36.3 C), temperature source Temporal, height 5\' 8"  (1.727 m), weight 285 lb 12.8 oz (129.6 kg), SpO2 97 %. Gen:      No acute distress HEENT:  EOMI, sclera anicteric Neck:     No masses; no thyromegaly Lungs:    Clear to auscultation bilaterally; normal respiratory effort CV:         Regular rate and rhythm; no murmurs Abd:      + bowel sounds; soft, non-tender; no palpable masses, no distension Ext:    No edema; adequate peripheral perfusion Skin:      Warm and dry; no rash Neuro: alert and oriented x 3 Psych: normal  mood and affect  Data Reviewed: Imaging: CT abdomen pelvis 07/26/2020-visualized lung bases are normal.  Moderate hiatal hernia  Chest x-ray 11/18/2020-clear lungs, hiatal hernia I reviewed images personally.  PFTs:  Labs:  Cardiac: Echocardiogram 05/05/2017- LVEF 60-65%, mild RV dilatation  Assessment:  Dyspnea on exertion Etiology is unclear She has some smoking history and may have COPD.  She is concerned for pulmonary fibrosis as she has done some reading online I suspect she has restrictive lung disease secondary to kyphoscoliosis and a large hiatal hernia with body habitus and deconditioning contributing to presentation  Trial of Trelegy  inhaler Schedule high-res CT and PFTs for further evaluation  Plan/Recommendations: Trelegy High-res CT, PFTs  Marshell Garfinkel MD [facility] 03/23/2021, 11:16 AM  CC: [name], MD

## 2021-03-23 NOTE — Patient Instructions (Signed)
We will schedule pulmonary function test and high-res CT of the chest for evaluation of your lungs Start Trelegy inhaler  Follow-up in 1 to 2 months for review of tests and plan for next steps.

## 2021-03-23 NOTE — Addendum Note (Signed)
Addended by: Elton Sin on: 03/23/2021 02:45 PM   Modules accepted: Orders

## 2021-03-27 ENCOUNTER — Other Ambulatory Visit: Payer: Medicare HMO

## 2021-03-30 ENCOUNTER — Other Ambulatory Visit: Payer: Self-pay

## 2021-03-30 ENCOUNTER — Ambulatory Visit (INDEPENDENT_AMBULATORY_CARE_PROVIDER_SITE_OTHER): Payer: Medicare HMO | Admitting: Pulmonary Disease

## 2021-03-30 DIAGNOSIS — R0602 Shortness of breath: Secondary | ICD-10-CM

## 2021-03-30 LAB — PULMONARY FUNCTION TEST
DL/VA % pred: 126 %
DL/VA: 5.11 ml/min/mmHg/L
DLCO cor % pred: 111 %
DLCO cor: 24.15 ml/min/mmHg
DLCO unc % pred: 111 %
DLCO unc: 24.15 ml/min/mmHg
FEF 25-75 Post: 1.81 L/sec
FEF 25-75 Pre: 2.34 L/sec
FEF2575-%Change-Post: -22 %
FEF2575-%Pred-Post: 85 %
FEF2575-%Pred-Pre: 110 %
FEV1-%Change-Post: -5 %
FEV1-%Pred-Post: 81 %
FEV1-%Pred-Pre: 85 %
FEV1-Post: 2.11 L
FEV1-Pre: 2.23 L
FEV1FVC-%Change-Post: 2 %
FEV1FVC-%Pred-Pre: 107 %
FEV6-%Change-Post: -7 %
FEV6-%Pred-Post: 77 %
FEV6-%Pred-Pre: 83 %
FEV6-Post: 2.52 L
FEV6-Pre: 2.71 L
FEV6FVC-%Change-Post: 0 %
FEV6FVC-%Pred-Post: 104 %
FEV6FVC-%Pred-Pre: 103 %
FVC-%Change-Post: -7 %
FVC-%Pred-Post: 73 %
FVC-%Pred-Pre: 80 %
FVC-Post: 2.52 L
FVC-Pre: 2.73 L
Post FEV1/FVC ratio: 84 %
Post FEV6/FVC ratio: 100 %
Pre FEV1/FVC ratio: 82 %
Pre FEV6/FVC Ratio: 99 %
RV % pred: 103 %
RV: 2.39 L
TLC % pred: 95 %
TLC: 5.27 L

## 2021-03-30 NOTE — Progress Notes (Signed)
Full PFT completed today ? ?

## 2021-04-06 NOTE — Telephone Encounter (Signed)
Dr Vaughan Browner, please advise on pt email, thank you.  [name], MD 1 hour ago (8:10 AM)   SR   In view of my results on the pulmonary function test, do you think it still necessary to have the CT? I am thinking about my cost for the scan. I think I will cancel the scan until I can talk to you at my next appointment. Thanks. Melanie Hurst

## 2021-04-06 NOTE — Telephone Encounter (Signed)
I have reviewed the PFTs which showed normal lung function.  I agree with canceling CT scan Will discuss in detail at clinic visit

## 2021-04-16 DIAGNOSIS — M5451 Vertebrogenic low back pain: Secondary | ICD-10-CM | POA: Diagnosis not present

## 2021-04-16 DIAGNOSIS — M9902 Segmental and somatic dysfunction of thoracic region: Secondary | ICD-10-CM | POA: Diagnosis not present

## 2021-04-16 DIAGNOSIS — M9903 Segmental and somatic dysfunction of lumbar region: Secondary | ICD-10-CM | POA: Diagnosis not present

## 2021-04-16 DIAGNOSIS — M9905 Segmental and somatic dysfunction of pelvic region: Secondary | ICD-10-CM | POA: Diagnosis not present

## 2021-04-17 DIAGNOSIS — M5451 Vertebrogenic low back pain: Secondary | ICD-10-CM | POA: Diagnosis not present

## 2021-04-17 DIAGNOSIS — M9902 Segmental and somatic dysfunction of thoracic region: Secondary | ICD-10-CM | POA: Diagnosis not present

## 2021-04-17 DIAGNOSIS — M9905 Segmental and somatic dysfunction of pelvic region: Secondary | ICD-10-CM | POA: Diagnosis not present

## 2021-04-17 DIAGNOSIS — M9903 Segmental and somatic dysfunction of lumbar region: Secondary | ICD-10-CM | POA: Diagnosis not present

## 2021-04-20 DIAGNOSIS — M5451 Vertebrogenic low back pain: Secondary | ICD-10-CM | POA: Diagnosis not present

## 2021-04-20 DIAGNOSIS — M9905 Segmental and somatic dysfunction of pelvic region: Secondary | ICD-10-CM | POA: Diagnosis not present

## 2021-04-20 DIAGNOSIS — M9903 Segmental and somatic dysfunction of lumbar region: Secondary | ICD-10-CM | POA: Diagnosis not present

## 2021-04-20 DIAGNOSIS — M9902 Segmental and somatic dysfunction of thoracic region: Secondary | ICD-10-CM | POA: Diagnosis not present

## 2021-04-22 DIAGNOSIS — M5451 Vertebrogenic low back pain: Secondary | ICD-10-CM | POA: Diagnosis not present

## 2021-04-22 DIAGNOSIS — M9903 Segmental and somatic dysfunction of lumbar region: Secondary | ICD-10-CM | POA: Diagnosis not present

## 2021-04-22 DIAGNOSIS — M9902 Segmental and somatic dysfunction of thoracic region: Secondary | ICD-10-CM | POA: Diagnosis not present

## 2021-04-22 DIAGNOSIS — M9905 Segmental and somatic dysfunction of pelvic region: Secondary | ICD-10-CM | POA: Diagnosis not present

## 2021-04-23 ENCOUNTER — Ambulatory Visit: Payer: Medicare HMO

## 2021-04-24 DIAGNOSIS — M9902 Segmental and somatic dysfunction of thoracic region: Secondary | ICD-10-CM | POA: Diagnosis not present

## 2021-04-24 DIAGNOSIS — M5451 Vertebrogenic low back pain: Secondary | ICD-10-CM | POA: Diagnosis not present

## 2021-04-24 DIAGNOSIS — M9905 Segmental and somatic dysfunction of pelvic region: Secondary | ICD-10-CM | POA: Diagnosis not present

## 2021-04-24 DIAGNOSIS — M9903 Segmental and somatic dysfunction of lumbar region: Secondary | ICD-10-CM | POA: Diagnosis not present

## 2021-04-27 ENCOUNTER — Other Ambulatory Visit: Payer: Medicare HMO

## 2021-04-29 DIAGNOSIS — M9902 Segmental and somatic dysfunction of thoracic region: Secondary | ICD-10-CM | POA: Diagnosis not present

## 2021-04-29 DIAGNOSIS — M9903 Segmental and somatic dysfunction of lumbar region: Secondary | ICD-10-CM | POA: Diagnosis not present

## 2021-05-01 DIAGNOSIS — M9903 Segmental and somatic dysfunction of lumbar region: Secondary | ICD-10-CM | POA: Diagnosis not present

## 2021-05-01 DIAGNOSIS — M9902 Segmental and somatic dysfunction of thoracic region: Secondary | ICD-10-CM | POA: Diagnosis not present

## 2021-05-04 DIAGNOSIS — M9903 Segmental and somatic dysfunction of lumbar region: Secondary | ICD-10-CM | POA: Diagnosis not present

## 2021-05-04 DIAGNOSIS — M9902 Segmental and somatic dysfunction of thoracic region: Secondary | ICD-10-CM | POA: Diagnosis not present

## 2021-05-05 ENCOUNTER — Ambulatory Visit: Payer: Medicare HMO | Admitting: Pulmonary Disease

## 2021-05-06 DIAGNOSIS — M9903 Segmental and somatic dysfunction of lumbar region: Secondary | ICD-10-CM | POA: Diagnosis not present

## 2021-05-06 DIAGNOSIS — M9902 Segmental and somatic dysfunction of thoracic region: Secondary | ICD-10-CM | POA: Diagnosis not present

## 2021-05-11 DIAGNOSIS — M9903 Segmental and somatic dysfunction of lumbar region: Secondary | ICD-10-CM | POA: Diagnosis not present

## 2021-05-11 DIAGNOSIS — M9902 Segmental and somatic dysfunction of thoracic region: Secondary | ICD-10-CM | POA: Diagnosis not present

## 2021-05-13 DIAGNOSIS — M9902 Segmental and somatic dysfunction of thoracic region: Secondary | ICD-10-CM | POA: Diagnosis not present

## 2021-05-13 DIAGNOSIS — M9903 Segmental and somatic dysfunction of lumbar region: Secondary | ICD-10-CM | POA: Diagnosis not present

## 2021-05-19 DIAGNOSIS — M9903 Segmental and somatic dysfunction of lumbar region: Secondary | ICD-10-CM | POA: Diagnosis not present

## 2021-05-19 DIAGNOSIS — M5451 Vertebrogenic low back pain: Secondary | ICD-10-CM | POA: Diagnosis not present

## 2021-05-19 DIAGNOSIS — M9902 Segmental and somatic dysfunction of thoracic region: Secondary | ICD-10-CM | POA: Diagnosis not present

## 2021-05-21 DIAGNOSIS — M9902 Segmental and somatic dysfunction of thoracic region: Secondary | ICD-10-CM | POA: Diagnosis not present

## 2021-05-21 DIAGNOSIS — M9903 Segmental and somatic dysfunction of lumbar region: Secondary | ICD-10-CM | POA: Diagnosis not present

## 2021-05-26 DIAGNOSIS — M9903 Segmental and somatic dysfunction of lumbar region: Secondary | ICD-10-CM | POA: Diagnosis not present

## 2021-05-26 DIAGNOSIS — M9902 Segmental and somatic dysfunction of thoracic region: Secondary | ICD-10-CM | POA: Diagnosis not present

## 2021-06-30 ENCOUNTER — Ambulatory Visit: Payer: Medicare HMO | Admitting: Family Medicine

## 2021-07-07 ENCOUNTER — Other Ambulatory Visit: Payer: Self-pay | Admitting: *Deleted

## 2021-07-07 NOTE — Telephone Encounter (Signed)
Received fax requesting refill on Provigil.   Ok to refill??  Last office visit 11/04/2020.  Last refill 02/12/2021.

## 2021-07-09 MED ORDER — MODAFINIL 200 MG PO TABS
200.0000 mg | ORAL_TABLET | Freq: Every day | ORAL | 0 refills | Status: DC
Start: 2021-07-09 — End: 2021-09-16

## 2021-07-26 ENCOUNTER — Other Ambulatory Visit: Payer: Self-pay | Admitting: Family Medicine

## 2021-07-27 ENCOUNTER — Ambulatory Visit (INDEPENDENT_AMBULATORY_CARE_PROVIDER_SITE_OTHER): Payer: Medicare HMO | Admitting: Family Medicine

## 2021-07-27 ENCOUNTER — Other Ambulatory Visit: Payer: Self-pay

## 2021-07-27 ENCOUNTER — Encounter: Payer: Self-pay | Admitting: Family Medicine

## 2021-07-27 VITALS — BP 134/68 | HR 88 | Temp 98.7°F | Resp 18 | Ht 68.0 in | Wt 278.0 lb

## 2021-07-27 DIAGNOSIS — R6889 Other general symptoms and signs: Secondary | ICD-10-CM | POA: Diagnosis not present

## 2021-07-27 DIAGNOSIS — R110 Nausea: Secondary | ICD-10-CM

## 2021-07-27 DIAGNOSIS — D539 Nutritional anemia, unspecified: Secondary | ICD-10-CM | POA: Diagnosis not present

## 2021-07-27 MED ORDER — SUCRALFATE 1 G PO TABS: 1.0000 g | ORAL_TABLET | Freq: Three times a day (TID) | ORAL | 1 refills | Status: DC

## 2021-07-27 NOTE — Progress Notes (Signed)
Subjective:    Patient ID: Melanie Hurst, female    DOB: 25-Apr-1951, 70 y.o.   MRN: [id number] Patient is a 70 year old Caucasian female here today due to nausea.  She states for several months, she awakens every morning to feel nauseated.  She denies any vomiting.  The nausea gradually improves throughout the day.  She denies any headache.  She denies any blurry vision.  She denies any vertigo or dizziness.  She denies any melena or hematochezia.  She does occasionally have burning epigastric discomfort despite taking Protonix.  She denies any jaundice.  There is no evidence of scleral icterus.  She denies any fevers or chills.  She has had some intentional weight loss due to dietary changes.  She denies any constipation or diarrhea or melena or hematochezia.  She does have a history of gastritis seen on EGD in 2020 for which she takes Protonix. Past Medical History:  Diagnosis Date   Anemia    Anxiety    Depression    Fibromyalgia    GERD (gastroesophageal reflux disease)    Hypertension    IBS (irritable bowel syndrome)    Obesity    Scoliosis    Sleep apnea    cpap at 7 cm   Tremor, essential    Past Surgical History:  Procedure Laterality Date   PCOS surgery  1971   TONSILECTOMY, ADENOIDECTOMY, BILATERAL MYRINGOTOMY AND TUBES  1962   Current Outpatient Medications on File Prior to Visit  Medication Sig Dispense Refill   losartan (COZAAR) 50 MG tablet TAKE 1 TABLET TWICE A DAY 180 tablet 2   modafinil (PROVIGIL) 200 MG tablet Take 1 tablet (200 mg total) by mouth daily. 90 tablet 0   pantoprazole (PROTONIX) 40 MG tablet Take 1 tablet (40 mg total) by mouth daily. 90 tablet 3   venlafaxine XR (EFFEXOR XR) 150 MG 24 hr capsule Take 1 capsule (150 mg total) by mouth daily with breakfast. Patient will take 225 mg total 90 capsule 3   venlafaxine XR (EFFEXOR-XR) 75 MG 24 hr capsule TAKE 1 CAPSULE EVERY       MORNING AND AT BEDTIME 180 capsule 1   No current facility-administered  medications on file prior to visit.   Allergies  Allergen Reactions   Asa [Aspirin] Hives   Codeine     Vomit    Social History   Socioeconomic History   Marital status: Married    Spouse name: Not on file   Number of children: 0   Years of education: Not on file   Highest education level: Not on file  Occupational History   Occupation: retired  Tobacco Use   Smoking status: Former    Years: 6.00    Types: Cigarettes    Quit date: 10/11/2009    Years since quitting: 11.8   Smokeless tobacco: Never  Vaping Use   Vaping Use: Former   Devices: vaped to stop smoking  Substance and Sexual Activity   Alcohol use: Yes    Comment: Occasional wine   Drug use: No   Sexual activity: Not Currently  Other Topics Concern   Not on file  Social History Narrative   Not on file   Social Determinants of Health   Financial Resource Strain: Not on file  Food Insecurity: Not on file  Transportation Needs: Not on file  Physical Activity: Not on file  Stress: Not on file  Social Connections: Not on file  Intimate Partner Violence: Not on file  Review of Systems  All other systems reviewed and are negative.     Objective:   Physical Exam Vitals reviewed.  Constitutional:      Appearance: She is well-developed. She is obese. She is not diaphoretic.  Cardiovascular:     Rate and Rhythm: Normal rate and regular rhythm.     Heart sounds: Normal heart sounds. No murmur heard. Pulmonary:     Effort: Pulmonary effort is normal. No respiratory distress.     Breath sounds: Normal breath sounds. No wheezing.  Abdominal:     General: Bowel sounds are normal. There is no distension.     Palpations: Abdomen is soft.     Tenderness: There is no abdominal tenderness. There is no rebound.  Musculoskeletal:     Right lower leg: No edema.     Left lower leg: No edema.  Neurological:     Mental Status: She is alert and oriented to person, place, and time.     Cranial Nerves: No  cranial nerve deficit.     Motor: No abnormal muscle tone.     Coordination: Coordination normal.     Deep Tendon Reflexes: Reflexes are normal and symmetric.          Assessment & Plan:  Nausea - Plan: CBC with Differential/Platelet, COMPLETE METABOLIC PANEL WITH GFR, Lipase  Differential diagnosis is broad.  Check CBC CMP and lipase.  My leading suspicion is likely gastritis given her previous history.  Continue Protonix 40 mg a day but add sucralfate 1 g p.o. q. ACH S and recheck in 2 weeks.  If not improving, consider gastric emptying study versus an EGD.  If lab work shows any abnormalities, pursue these further.  Reassess in 2 weeks or sooner if worse

## 2021-08-01 LAB — COMPLETE METABOLIC PANEL WITH GFR
AG Ratio: 1.9 (calc) (ref 1.0–2.5)
ALT: 10 U/L (ref 6–29)
AST: 10 U/L (ref 10–35)
Albumin: 4.2 g/dL (ref 3.6–5.1)
Alkaline phosphatase (APISO): 105 U/L (ref 37–153)
BUN: 7 mg/dL (ref 7–25)
CO2: 28 mmol/L (ref 20–32)
Calcium: 10.2 mg/dL (ref 8.6–10.4)
Chloride: 106 mmol/L (ref 98–110)
Creat: 0.84 mg/dL (ref 0.60–1.00)
Globulin: 2.2 g/dL (calc) (ref 1.9–3.7)
Glucose, Bld: 106 mg/dL — ABNORMAL HIGH (ref 65–99)
Potassium: 5.8 mmol/L — ABNORMAL HIGH (ref 3.5–5.3)
Sodium: 142 mmol/L (ref 135–146)
Total Bilirubin: 0.5 mg/dL (ref 0.2–1.2)
Total Protein: 6.4 g/dL (ref 6.1–8.1)
eGFR: 75 mL/min/{1.73_m2} (ref >=60)

## 2021-08-01 LAB — CBC WITH DIFFERENTIAL/PLATELET
Absolute Monocytes: 422 cells/uL (ref 200–950)
Basophils Absolute: 81 cells/uL (ref 0–200)
Basophils Relative: 1.3 %
Eosinophils Absolute: 143 cells/uL (ref 15–500)
Eosinophils Relative: 2.3 %
HCT: 40.4 % (ref 35.0–45.0)
Hemoglobin: 12.5 g/dL (ref 11.7–15.5)
Lymphs Abs: 2201 cells/uL (ref 850–3900)
MCH: 24.5 pg — ABNORMAL LOW (ref 27.0–33.0)
MCHC: 30.9 g/dL — ABNORMAL LOW (ref 32.0–36.0)
MCV: 79.1 fL — ABNORMAL LOW (ref 80.0–100.0)
MPV: 9.3 fL (ref 7.5–12.5)
Monocytes Relative: 6.8 %
Neutro Abs: 3354 cells/uL (ref 1500–7800)
Neutrophils Relative %: 54.1 %
Platelets: 407 10*3/uL — ABNORMAL HIGH (ref 140–400)
RBC: 5.11 10*6/uL — ABNORMAL HIGH (ref 3.80–5.10)
RDW: 17.4 % — ABNORMAL HIGH (ref 11.0–15.0)
Total Lymphocyte: 35.5 %
WBC: 6.2 10*3/uL (ref 3.8–10.8)

## 2021-08-01 LAB — LIPASE: Lipase: 21 U/L (ref 7–60)

## 2021-08-01 LAB — IRON,TIBC AND FERRITIN PANEL
%SAT: 10 % (calc) — ABNORMAL LOW (ref 16–45)
Ferritin: 5 ng/mL — ABNORMAL LOW (ref 16–288)
Iron: 45 ug/dL (ref 45–160)
TIBC: 471 mcg/dL (calc) — ABNORMAL HIGH (ref 250–450)

## 2021-08-01 LAB — VITAMIN B12: Vitamin B-12: 320 pg/mL (ref 200–1100)

## 2021-08-06 ENCOUNTER — Ambulatory Visit: Payer: Medicare HMO

## 2021-08-11 ENCOUNTER — Other Ambulatory Visit: Payer: Self-pay

## 2021-08-11 ENCOUNTER — Ambulatory Visit (INDEPENDENT_AMBULATORY_CARE_PROVIDER_SITE_OTHER): Payer: Medicare HMO | Admitting: Family Medicine

## 2021-08-11 ENCOUNTER — Encounter: Payer: Self-pay | Admitting: Family Medicine

## 2021-08-11 VITALS — BP 142/88 | HR 90 | Temp 98.7°F | Resp 16 | Ht 68.0 in | Wt 276.0 lb

## 2021-08-11 DIAGNOSIS — E611 Iron deficiency: Secondary | ICD-10-CM

## 2021-08-11 DIAGNOSIS — R718 Other abnormality of red blood cells: Secondary | ICD-10-CM | POA: Diagnosis not present

## 2021-08-11 NOTE — Progress Notes (Signed)
Subjective:    Patient ID: Melanie Hurst, female    DOB: [birth year], 70 y.o.   MRN: [id number] Please see my last office visit.  Patient was found to have microcytosis.  As part of the work-up for this, we checked her iron level which was borderline low at 45, her ferritin level was also low.  Her percent saturation was 10%.  Her total iron binding capacity was elevated.  All of this is consistent with iron deficiency.  She is not anemic however with a hemoglobin of 12.  My concern is that with the nausea and the GI upset, that she may be dealing with acute blood loss in the stool causing the iron deficiency.  Therefore I recommended stool cards x3.  We were unable to reach the patient via telephone to discuss this with her.  However she comes in today for follow-up and we explained the situation to her. Past Medical History:  Diagnosis Date   Anemia    Anxiety    Depression    Fibromyalgia    GERD (gastroesophageal reflux disease)    Hypertension    IBS (irritable bowel syndrome)    Obesity    Scoliosis    Sleep apnea    cpap at 7 cm   Tremor, essential    Past Surgical History:  Procedure Laterality Date   PCOS surgery  1971   TONSILECTOMY, ADENOIDECTOMY, BILATERAL MYRINGOTOMY AND TUBES  1962   Current Outpatient Medications on File Prior to Visit  Medication Sig Dispense Refill   losartan (COZAAR) 50 MG tablet TAKE 1 TABLET TWICE A DAY 180 tablet 2   modafinil (PROVIGIL) 200 MG tablet Take 1 tablet (200 mg total) by mouth daily. 90 tablet 0   pantoprazole (PROTONIX) 40 MG tablet Take 1 tablet (40 mg total) by mouth daily. 90 tablet 3   sucralfate (CARAFATE) 1 g tablet Take 1 tablet (1 g total) by mouth 4 (four) times daily -  with meals and at bedtime. 120 tablet 1   venlafaxine XR (EFFEXOR XR) 150 MG 24 hr capsule Take 1 capsule (150 mg total) by mouth daily with breakfast. Patient will take 225 mg total 90 capsule 3   venlafaxine XR (EFFEXOR-XR) 75 MG 24 hr capsule TAKE 1  CAPSULE EVERY       MORNING AND AT BEDTIME 180 capsule 1   No current facility-administered medications on file prior to visit.   Allergies  Allergen Reactions   Asa [Aspirin] Hives   Codeine     Vomit    Social History   Socioeconomic History   Marital status: Married    Spouse name: Not on file   Number of children: 0   Years of education: Not on file   Highest education level: Not on file  Occupational History   Occupation: retired  Tobacco Use   Smoking status: Former    Years: 6.00    Types: Cigarettes    Quit date: 10/11/2009    Years since quitting: 11.8   Smokeless tobacco: Never  Vaping Use   Vaping Use: Former   Devices: vaped to stop smoking  Substance and Sexual Activity   Alcohol use: Yes    Comment: Occasional wine   Drug use: No   Sexual activity: Not Currently  Other Topics Concern   Not on file  Social History Narrative   Not on file   Social Determinants of Health   Financial Resource Strain: Not on file  Food Insecurity: Not on  file  Transportation Needs: Not on file  Physical Activity: Not on file  Stress: Not on file  Social Connections: Not on file  Intimate Partner Violence: Not on file      Review of Systems  All other systems reviewed and are negative.     Objective:   Physical Exam Vitals reviewed.  Constitutional:      Appearance: She is well-developed. She is obese. She is not diaphoretic.  Cardiovascular:     Rate and Rhythm: Normal rate and regular rhythm.     Heart sounds: Normal heart sounds. No murmur heard. Pulmonary:     Effort: Pulmonary effort is normal. No respiratory distress.     Breath sounds: Normal breath sounds. No wheezing.  Abdominal:     General: Bowel sounds are normal. There is no distension.     Palpations: Abdomen is soft.     Tenderness: There is no abdominal tenderness. There is no rebound.  Musculoskeletal:     Right lower leg: No edema.     Left lower leg: No edema.  Neurological:      Mental Status: She is alert and oriented to person, place, and time.     Cranial Nerves: No cranial nerve deficit.     Motor: No abnormal muscle tone.     Coordination: Coordination normal.     Deep Tendon Reflexes: Reflexes are normal and symmetric.          Assessment & Plan:  Microcytosis  Iron deficiency I recommended that we check her stool for blood x3.  If negative, we will start her on ferrous sulfate 325 mg daily and recheck iron levels in 1 month.  If positive for blood given the nausea and GI symptoms she was having at her last visit, I would recommend GI consultation for EGD and colonoscopy.  Patient is comfortable with this plan

## 2021-08-12 DIAGNOSIS — E611 Iron deficiency: Secondary | ICD-10-CM | POA: Diagnosis not present

## 2021-08-13 ENCOUNTER — Other Ambulatory Visit: Payer: Medicare HMO

## 2021-08-13 ENCOUNTER — Other Ambulatory Visit: Payer: Self-pay

## 2021-08-13 DIAGNOSIS — E611 Iron deficiency: Secondary | ICD-10-CM

## 2021-08-14 ENCOUNTER — Other Ambulatory Visit: Payer: Self-pay | Admitting: *Deleted

## 2021-08-14 DIAGNOSIS — R195 Other fecal abnormalities: Secondary | ICD-10-CM

## 2021-08-14 LAB — FECAL GLOBIN BY IMMUNOCHEMISTRY
FECAL GLOBIN RESULT:: DETECTED — AB
FECAL GLOBIN RESULT:: DETECTED — AB
FECAL GLOBIN RESULT:: DETECTED — AB
MICRO NUMBER:: 12589655
MICRO NUMBER:: 12589688
MICRO NUMBER:: 12589689
SPECIMEN QUALITY:: ADEQUATE
SPECIMEN QUALITY:: ADEQUATE
SPECIMEN QUALITY:: ADEQUATE

## 2021-08-25 ENCOUNTER — Telehealth: Payer: Self-pay | Admitting: *Deleted

## 2021-08-25 NOTE — Telephone Encounter (Signed)
Received call from patient.   Requested information about referral for colonoscopy. Advised that referral has been sent to [facility].   Please contact to follow up.

## 2021-09-11 ENCOUNTER — Ambulatory Visit (INDEPENDENT_AMBULATORY_CARE_PROVIDER_SITE_OTHER): Payer: Medicare HMO | Admitting: Physician Assistant

## 2021-09-11 ENCOUNTER — Encounter: Payer: Self-pay | Admitting: Physician Assistant

## 2021-09-11 VITALS — BP 134/80 | HR 100 | Ht 66.0 in | Wt 274.2 lb

## 2021-09-11 DIAGNOSIS — D509 Iron deficiency anemia, unspecified: Secondary | ICD-10-CM | POA: Diagnosis not present

## 2021-09-11 DIAGNOSIS — R110 Nausea: Secondary | ICD-10-CM

## 2021-09-11 DIAGNOSIS — G8929 Other chronic pain: Secondary | ICD-10-CM

## 2021-09-11 DIAGNOSIS — R1013 Epigastric pain: Secondary | ICD-10-CM | POA: Diagnosis not present

## 2021-09-11 DIAGNOSIS — R195 Other fecal abnormalities: Secondary | ICD-10-CM

## 2021-09-11 MED ORDER — PLENVU 140 G PO SOLR: 1.0000 | ORAL | 0 refills | Status: DC

## 2021-09-11 MED ORDER — PANTOPRAZOLE SODIUM 40 MG PO TBEC: 40.0000 mg | DELAYED_RELEASE_TABLET | Freq: Two times a day (BID) | ORAL | 5 refills | Status: DC

## 2021-09-11 MED ORDER — ONDANSETRON 4 MG PO TBDP: ORAL_TABLET | ORAL | 5 refills | Status: DC

## 2021-09-11 NOTE — Patient Instructions (Signed)
If you are age 70 or older, your body mass index should be between 23-30. Your Body mass index is 44.27 kg/m. If this is out of the aforementioned range listed, please consider follow up with your Primary Care Provider. ________________________________________________________  The [facility] providers would like to encourage you to use [employer] to communicate with providers for non-urgent requests or questions.  Due to long hold times on the telephone, sending your provider a message by [facility] may be a faster and more efficient way to get a response.  Please allow 48 business hours for a response.  Please remember that this is for non-urgent requests.  _______________________________________________________  Melanie Hurst have been scheduled for an endoscopy and colonoscopy. Please follow the written instructions given to you at your visit today. Please pick up your prep supplies at the pharmacy within the next 1-3 days. If you use inhalers (even only as needed), please bring them with you on the day of your procedure.   Increase your Pantoprazole 40 mg to twice daily  START Ondansetron ODT 4 mg use 1 tablet every 4-6 hours as needed for nausea.  Follow up pending the results of your Endoscopy/Colonoscopy.  Thank you for entrusting me with your care and choosing [facility].  Ellouise Newer, PA-C

## 2021-09-11 NOTE — Progress Notes (Signed)
Chief Complaint: Positive Hemoccult and iron deficiency anemia  HPI:    Melanie Hurst is a 70 year old female with a past medical history of fibromyalgia, GERD and IBS, known to Dr. Silverio Decamp, who was referred to me by [name], MD for a complaint of Hemoccult positive stool.    11/06/2018 colonoscopy for screening with only fair preparation of the colon, 2 1-3 mm polyps in the descending and ascending colon, 118 mm polyp in the sigmoid colon, 2 4-6 mm polyps in the rectum in the descending colon and mild diverticulosis in the sigmoid and descending colon as well as nonbleeding internal hemorrhoids.  Pathology showed adenomatous polyps.  Repeat was recommended in 3 years.    11/06/2018 EGD done for epigastric pain and heartburn with a medium sized hiatal hernia, gastritis, gastric erosions and mild duodenitis.  Patient was told to use Omeprazole 40 mg p.o. daily.  Pathology showed benign gastric mucosa.    07/27/2021 iron studies with a low iron and percent saturation 10%, ferritin only 5.  CBC with a normal hemoglobin at 12.5 (decreased though from patient's baseline of around 14).  Patient started on iron supplementation.    08/12/2021 Hemoccult testing positive.    Today, the patient presents to clinic and tells me that she has been experiencing nausea worse over the past year or so, it now occurs every day at some point.  Was also having some epigastric pain with this but her PCP started her on Carafate which seemed to help with the pain, but this made her more nauseous so she discontinued it.  She is still on Pantoprazole 40 mg once daily.  Describes she used to take aspirin for her back and knees but has changed to Tylenol over the past year.  Aware of her iron deficiency anemia and recommendations for EGD and colonoscopy.    Denies fever, chills, weight loss, seeing blood in her stool, melena or symptoms that awaken her from sleep.     Past Medical History:  Diagnosis Date   Anemia     Anxiety    Depression    Fibromyalgia    GERD (gastroesophageal reflux disease)    Hypertension    IBS (irritable bowel syndrome)    Obesity    Scoliosis    Sleep apnea    cpap at 7 cm   Tremor, essential     Past Surgical History:  Procedure Laterality Date   PCOS surgery  1971   TONSILECTOMY, ADENOIDECTOMY, BILATERAL MYRINGOTOMY AND TUBES  1962    Current Outpatient Medications  Medication Sig Dispense Refill   losartan (COZAAR) 50 MG tablet TAKE 1 TABLET TWICE A DAY 180 tablet 2   modafinil (PROVIGIL) 200 MG tablet Take 1 tablet (200 mg total) by mouth daily. 90 tablet 0   pantoprazole (PROTONIX) 40 MG tablet Take 1 tablet (40 mg total) by mouth daily. 90 tablet 3   sucralfate (CARAFATE) 1 g tablet Take 1 tablet (1 g total) by mouth 4 (four) times daily -  with meals and at bedtime. 120 tablet 1   venlafaxine XR (EFFEXOR XR) 150 MG 24 hr capsule Take 1 capsule (150 mg total) by mouth daily with breakfast. Patient will take 225 mg total 90 capsule 3   venlafaxine XR (EFFEXOR-XR) 75 MG 24 hr capsule TAKE 1 CAPSULE EVERY       MORNING AND AT BEDTIME 180 capsule 1   No current facility-administered medications for this visit.    Allergies as of 09/11/2021 -  Review Complete 08/11/2021  Allergen Reaction Noted   Asa [aspirin] Hives 08/19/2017   Codeine  03/15/2013    Family History  Problem Relation Age of Onset   Melanoma Mother        wrist   Other Mother        brain tumor   Stroke Mother    Colon polyps Father        not cancerous   Kidney failure Father    Breast cancer Sister    Breast cancer Paternal Aunt    Colon cancer Paternal Aunt    Irritable bowel syndrome Paternal Aunt    Stomach cancer Maternal Uncle    Esophageal cancer Neg Hx    Rectal cancer Neg Hx     Social History   Socioeconomic History   Marital status: Married    Spouse name: Not on file   Number of children: 0   Years of education: Not on file   Highest education level: Not on file   Occupational History   Occupation: retired  Tobacco Use   Smoking status: Former    Years: 6.00    Types: Cigarettes    Quit date: 10/11/2009    Years since quitting: 11.9   Smokeless tobacco: Never  Vaping Use   Vaping Use: Former   Devices: vaped to stop smoking  Substance and Sexual Activity   Alcohol use: Yes    Comment: Occasional wine   Drug use: No   Sexual activity: Not Currently  Other Topics Concern   Not on file  Social History Narrative   Not on file   Social Determinants of Health   Financial Resource Strain: Not on file  Food Insecurity: Not on file  Transportation Needs: Not on file  Physical Activity: Not on file  Stress: Not on file  Social Connections: Not on file  Intimate Partner Violence: Not on file    Review of Systems:    Constitutional: No weight loss, fever or chills Cardiovascular: No chest pain  Respiratory: No SOB  Gastrointestinal: See HPI and otherwise negative   Physical Exam:  Vital signs: BP 134/80 (BP Location: Left Arm, Patient Position: Sitting, Cuff Size: Large)   Pulse 100   Ht 5\' 6"  (1.676 m) Comment: height measured without shoes  Wt 274 lb 4 oz (124.4 kg)   BMI 44.27 kg/m    Constitutional:   Pleasant obese Caucasian female appears to be in NAD, Well developed, Well nourished, alert and cooperative Head:  Normocephalic and atraumatic. Eyes:   PEERL, EOMI. No icterus. Conjunctiva pink. Ears:  Normal auditory acuity. Neck:  Supple Throat: Oral cavity and pharynx without inflammation, swelling or lesion.  Respiratory: Respirations even and unlabored. Lungs clear to auscultation bilaterally.   No wheezes, crackles, or rhonchi.  Cardiovascular: Normal S1, S2. No MRG. Regular rate and rhythm. No peripheral edema, cyanosis or pallor.  Gastrointestinal:  Soft, nondistended, mild epigastric TTP, no rebound or guarding. Normal bowel sounds. No appreciable masses or hepatomegaly. Rectal:  Not performed.  Msk:  Symmetrical  without gross deformities. Without edema, no deformity or joint abnormality.  + ambulates with cane Neurologic:  Alert and  oriented x4;  grossly normal neurologically.  Skin:   Dry and intact without significant lesions or rashes. Psychiatric: emonstrates good judgement and reason without abnormal affect or behaviors.  RELEVANT LABS AND IMAGING: CBC    Component Value Date/Time   WBC 6.2 07/27/2021 1133   RBC 5.11 (H) 07/27/2021 1133   HGB  12.5 07/27/2021 1133   HCT 40.4 07/27/2021 1133   PLT 407 (H) 07/27/2021 1133   MCV 79.1 (L) 07/27/2021 1133   MCH 24.5 (L) 07/27/2021 1133   MCHC 30.9 (L) 07/27/2021 1133   RDW 17.4 (H) 07/27/2021 1133   LYMPHSABS 2,201 07/27/2021 1133   MONOABS 390 04/19/2017 1433   EOSABS 143 07/27/2021 1133   BASOSABS 81 07/27/2021 1133    CMP     Component Value Date/Time   NA 142 07/27/2021 1133   NA 141 08/13/2020 1400   K 5.8 (H) 07/27/2021 1133   CL 106 07/27/2021 1133   CO2 28 07/27/2021 1133   GLUCOSE 106 (H) 07/27/2021 1133   BUN 7 07/27/2021 1133   BUN 14 08/13/2020 1400   CREATININE 0.84 07/27/2021 1133   CALCIUM 10.2 07/27/2021 1133   PROT 6.4 07/27/2021 1133   PROT 6.9 11/07/2017 1419   ALBUMIN 4.6 11/07/2017 1419   AST 10 07/27/2021 1133   ALT 10 07/27/2021 1133   ALKPHOS 122 (H) 11/07/2017 1419   BILITOT 0.5 07/27/2021 1133   BILITOT 0.4 11/07/2017 1419   GFRNONAA 60 11/04/2020 1437   GFRAA 70 11/04/2020 1437    Assessment: 1.  Iron deficiency anemia: New finding in October of this year, Hemoccult positive stools, previous EGD and colonoscopy in 2020 with adenomatous polyps and gastric erosions; consider GI source versus other 2.  Nausea and epigastric pain: With above 3.  History of adenomatous polyps: Last colonoscopy in 2020 with repeat recommended in January 2023  Plan: 1.  We will schedule patient for diagnostic EGD and colonoscopy in the [place] with Dr. Silverio Decamp.  Did provide the patient a detailed list of risks for the  procedures and she agrees to proceed. Patient is appropriate for endoscopic procedure(s) in the ambulatory ([facility]) setting.  We will use a 2-day bowel prep this time given only fair prep at last colonoscopy. 2.  Continue iron as per PCPs recommendations 3.  Increased Pantoprazole to 40 mg twice daily, 30-6 minutes before breakfast and dinner.  Prescribed #60 with 5 refills. 4.  Prescribed Zofran 4 mg ODT every 4-6 hours as needed for nausea #30 with 2 refills. 5.  Discussed with patient that if her nausea is due to her gastritis and gastric erosions that hopefully the Pantoprazole will eventually help this.  She does tell me the Pantoprazole helped initially, but then the nausea came back. 6.  Patient to follow in clinic recommendations for Dr. Silverio Decamp after time of procedures.  Ellouise Newer, PA-C [facility] 09/11/2021, 1:49 PM  Cc: [name], MD

## 2021-09-16 ENCOUNTER — Other Ambulatory Visit: Payer: Self-pay

## 2021-09-16 NOTE — Telephone Encounter (Signed)
LOV 08/11/21 Last refill 07/09/21, #90, 0 refills  Please review, thanks!

## 2021-09-17 MED ORDER — MODAFINIL 200 MG PO TABS: 200.0000 mg | ORAL_TABLET | Freq: Every day | ORAL | 1 refills | Status: DC

## 2021-10-11 HISTORY — PX: ESOPHAGOGASTRODUODENOSCOPY: SHX1529

## 2021-10-11 HISTORY — PX: COLONOSCOPY: SHX174

## 2021-10-15 ENCOUNTER — Other Ambulatory Visit: Payer: Self-pay | Admitting: Family Medicine

## 2021-10-15 ENCOUNTER — Telehealth: Payer: Self-pay

## 2021-10-16 MED ORDER — ARIPIPRAZOLE 5 MG PO TABS: 5.0000 mg | ORAL_TABLET | Freq: Every day | ORAL | 3 refills | Status: DC

## 2021-10-16 NOTE — Telephone Encounter (Signed)
Abilify refilled.

## 2021-10-20 NOTE — Progress Notes (Signed)
Reviewed and agree with documentation and assessment and plan. K. Veena Aeryn Medici , MD   

## 2021-10-21 ENCOUNTER — Encounter: Payer: Medicare HMO | Admitting: Gastroenterology

## 2021-11-03 ENCOUNTER — Encounter: Payer: Self-pay | Admitting: Family Medicine

## 2021-11-03 MED ORDER — LOSARTAN POTASSIUM 50 MG PO TABS: 50.0000 mg | ORAL_TABLET | Freq: Two times a day (BID) | ORAL | 3 refills | Status: DC

## 2021-11-07 ENCOUNTER — Other Ambulatory Visit: Payer: Self-pay | Admitting: Family Medicine

## 2021-11-10 ENCOUNTER — Telehealth: Payer: Self-pay

## 2021-11-10 ENCOUNTER — Other Ambulatory Visit: Payer: Self-pay

## 2021-11-10 ENCOUNTER — Encounter: Payer: Self-pay | Admitting: Gastroenterology

## 2021-11-10 ENCOUNTER — Ambulatory Visit (AMBULATORY_SURGERY_CENTER): Payer: Medicare HMO | Admitting: Gastroenterology

## 2021-11-10 VITALS — BP 138/71 | HR 82 | Temp 97.9°F | Resp 16 | Ht 66.0 in | Wt 274.0 lb

## 2021-11-10 DIAGNOSIS — D120 Benign neoplasm of cecum: Secondary | ICD-10-CM

## 2021-11-10 DIAGNOSIS — K648 Other hemorrhoids: Secondary | ICD-10-CM | POA: Diagnosis not present

## 2021-11-10 DIAGNOSIS — D509 Iron deficiency anemia, unspecified: Secondary | ICD-10-CM | POA: Diagnosis not present

## 2021-11-10 DIAGNOSIS — D123 Benign neoplasm of transverse colon: Secondary | ICD-10-CM

## 2021-11-10 DIAGNOSIS — I10 Essential (primary) hypertension: Secondary | ICD-10-CM | POA: Diagnosis not present

## 2021-11-10 DIAGNOSIS — K5730 Diverticulosis of large intestine without perforation or abscess without bleeding: Secondary | ICD-10-CM

## 2021-11-10 DIAGNOSIS — R195 Other fecal abnormalities: Secondary | ICD-10-CM

## 2021-11-10 DIAGNOSIS — K449 Diaphragmatic hernia without obstruction or gangrene: Secondary | ICD-10-CM | POA: Diagnosis not present

## 2021-11-10 DIAGNOSIS — G4733 Obstructive sleep apnea (adult) (pediatric): Secondary | ICD-10-CM | POA: Diagnosis not present

## 2021-11-10 MED ORDER — SODIUM CHLORIDE 0.9 % IV SOLN: 500.0000 mL | INTRAVENOUS | Status: DC

## 2021-11-10 NOTE — Progress Notes (Signed)
[facility] History and Physical   Primary Care Physician:  [name], MD   Reason for Procedure:  Iron deficiency anemia  Plan:    EGD and colonoscopy with possible interventions as needed     HPI: Melanie Hurst is a very pleasant 71 y.o. female here for EGD and colonoscopy for evaluation of iron deficiency anemia. Please refer to office visit note by Ellouise Newer 09/11/21  The risks and benefits as well as alternatives of endoscopic procedure(s) have been discussed and reviewed. All questions answered. The patient agrees to proceed.    Past Medical History:  Diagnosis Date   Anemia    Anxiety    Depression    Fibromyalgia    GERD (gastroesophageal reflux disease)    Hypertension    IBS (irritable bowel syndrome)    Obesity    Scoliosis    Sleep apnea    cpap at 7 cm   Tremor, essential     Past Surgical History:  Procedure Laterality Date   PCOS surgery  1971   TONSILECTOMY, ADENOIDECTOMY, BILATERAL MYRINGOTOMY AND TUBES  1962    Prior to Admission medications   Medication Sig Start Date End Date Taking? Authorizing Provider  ARIPiprazole (ABILIFY) 5 MG tablet Take 1 tablet (5 mg total) by mouth daily. 10/16/21  Yes [name], MD  losartan (COZAAR) 50 MG tablet Take 1 tablet (50 mg total) by mouth 2 (two) times daily. 11/03/21  Yes [name], MD  modafinil (PROVIGIL) 200 MG tablet Take 1 tablet (200 mg total) by mouth daily. 09/17/21  Yes [name], MD  ondansetron (ZOFRAN-ODT) 4 MG disintegrating tablet Take 1 tablet every 4-6 hours as needed 09/11/21  Yes Levin Erp, PA  pantoprazole (PROTONIX) 40 MG tablet Take 1 tablet (40 mg total) by mouth 2 (two) times daily. 09/11/21  Yes Levin Erp, PA  venlafaxine XR (EFFEXOR-XR) 75 MG 24 hr capsule TAKE 1 CAPSULE EVERY       MORNING AND AT BEDTIME Patient taking differently: Take 2 capsules in the morning and 1 at night 07/27/21  Yes [name], MD     Current Outpatient Medications  Medication Sig Dispense Refill   ARIPiprazole (ABILIFY) 5 MG tablet Take 1 tablet (5 mg total) by mouth daily. 90 tablet 3   losartan (COZAAR) 50 MG tablet Take 1 tablet (50 mg total) by mouth 2 (two) times daily. 180 tablet 3   modafinil (PROVIGIL) 200 MG tablet Take 1 tablet (200 mg total) by mouth daily. 90 tablet 1   ondansetron (ZOFRAN-ODT) 4 MG disintegrating tablet Take 1 tablet every 4-6 hours as needed 30 tablet 5   pantoprazole (PROTONIX) 40 MG tablet Take 1 tablet (40 mg total) by mouth 2 (two) times daily. 60 tablet 5   venlafaxine XR (EFFEXOR-XR) 75 MG 24 hr capsule TAKE 1 CAPSULE EVERY       MORNING AND AT BEDTIME (Patient taking differently: Take 2 capsules in the morning and 1 at night) 180 capsule 1   Current Facility-Administered Medications  Medication Dose Route Frequency Provider Last Rate Last Admin   0.9 %  sodium chloride infusion  500 mL Intravenous Continuous Denard Tuminello, Venia Minks, MD        Allergies as of 11/10/2021 - Review Complete 11/10/2021  Allergen Reaction Noted   Asa [aspirin] Hives 08/19/2017   Codeine  03/15/2013    Family History  Problem Relation Age of Onset   Melanoma Mother  wrist   Other Mother        brain tumor   Stroke Mother    Colon polyps Father        not cancerous   Kidney failure Father    Breast cancer Sister    Breast cancer Paternal Aunt    Colon cancer Paternal Aunt    Irritable bowel syndrome Paternal Aunt    Stomach cancer Maternal Uncle    Esophageal cancer Neg Hx    Rectal cancer Neg Hx     Social History   Socioeconomic History   Marital status: Married    Spouse name: Not on file   Number of children: 0   Years of education: Not on file   Highest education level: Not on file  Occupational History   Occupation: retired  Tobacco Use   Smoking status: Former    Years: 6.00    Types: Cigarettes    Quit date: 10/11/2009    Years since quitting: 12.0   Smokeless  tobacco: Never  Vaping Use   Vaping Use: Former   Devices: vaped to stop smoking  Substance and Sexual Activity   Alcohol use: Yes    Comment: Occasional wine   Drug use: No   Sexual activity: Not Currently  Other Topics Concern   Not on file  Social History Narrative   Not on file   Social Determinants of Health   Financial Resource Strain: Not on file  Food Insecurity: Not on file  Transportation Needs: Not on file  Physical Activity: Not on file  Stress: Not on file  Social Connections: Not on file  Intimate Partner Violence: Not on file    Review of Systems:  All other review of systems negative except as mentioned in the HPI.  Physical Exam: Vital signs in last 24 hours: BP (!) 162/75    Pulse 79    Temp 97.9 F (36.6 C)    Ht 5\' 6"  (1.676 m)    Wt 274 lb (124.3 kg)    SpO2 94%    BMI 44.22 kg/m  General:   Alert, NAD Lungs:  Clear .   Heart:  Regular rate and rhythm Abdomen:  Soft, nontender and nondistended. Neuro/Psych:  Alert and cooperative. Normal mood and affect. A and O x 3  Reviewed labs, radiology imaging, old records and pertinent past GI work up  Patient is appropriate for planned procedure(s) and anesthesia in an ambulatory setting   K. Denzil Magnuson , MD [phone]

## 2021-11-10 NOTE — Progress Notes (Signed)
Called to room to assist during endoscopic procedure.  Patient ID and intended procedure confirmed with present staff. Received instructions for my participation in the procedure from the performing physician.  

## 2021-11-10 NOTE — Progress Notes (Signed)
VSS, transported to PACU °

## 2021-11-10 NOTE — Telephone Encounter (Signed)
-----   Message from Mauri Pole, MD sent at 11/10/2021  3:53 PM EST ----- - To visualize the small bowel, perform video capsule endoscopy at appointment to be scheduled.  - Follow up in GI office after small bowel capsule study - Refer to Dr.Lightfoot to discuss possible repair of symptomatic hiatal hernia  I did addendum after it was printed, FYI Thanks

## 2021-11-10 NOTE — Op Note (Signed)
[place] Patient Name: Melanie Hurst Procedure Date: 11/10/2021 2:08 PM MRN: [id number] Endoscopist: Mauri Pole , MD Age: 71 Referring MD:  Date of Birth: Jul 09, 1951 Gender: Female Account #: [id number] Procedure:                Colonoscopy Indications:              Unexplained iron deficiency anemia Medicines:                Monitored Anesthesia Care Procedure:                Pre-Anesthesia Assessment:                           - Prior to the procedure, a History and Physical                            was performed, and patient medications and                            allergies were reviewed. The patient's tolerance of                            previous anesthesia was also reviewed. The risks                            and benefits of the procedure and the sedation                            options and risks were discussed with the patient.                            All questions were answered, and informed consent                            was obtained. Prior Anticoagulants: The patient has                            taken no previous anticoagulant or antiplatelet                            agents. ASA Grade Assessment: III - A patient with                            severe systemic disease. After reviewing the risks                            and benefits, the patient was deemed in                            satisfactory condition to undergo the procedure.                           After obtaining informed consent, the colonoscope  was passed under direct vision. Throughout the                            procedure, the patient's blood pressure, pulse, and                            oxygen saturations were monitored continuously. The                            PCF-HQ190L Colonoscope was introduced through the                            anus and advanced to the the cecum, identified by                            appendiceal  orifice and ileocecal valve. The                            colonoscopy was performed without difficulty. The                            patient tolerated the procedure well. The quality                            of the bowel preparation was excellent. The                            ileocecal valve, appendiceal orifice, and rectum                            were photographed. Scope In: 2:28:22 PM Scope Out: 2:54:20 PM Scope Withdrawal Time: 0 hours 12 minutes 49 seconds  Total Procedure Duration: 0 hours 25 minutes 58 seconds  Findings:                 The perianal and digital rectal examinations were                            normal.                           A 7 mm polyp was found in the hepatic flexure. The                            polyp was sessile. The polyp was removed with a                            cold snare. Resection and retrieval were complete.                           A less than 1 mm polyp was found in the cecum. The                            polyp was sessile. The polyp was removed with  a                            cold biopsy forceps. Resection and retrieval were                            complete.                           Scattered small and large-mouthed diverticula were                            found in the sigmoid colon, transverse colon and                            ascending colon.                           Non-bleeding internal hemorrhoids were found during                            retroflexion. The hemorrhoids were large. Complications:            No immediate complications. Estimated Blood Loss:     Estimated blood loss was minimal. Impression:               - One 7 mm polyp at the hepatic flexure, removed                            with a cold snare. Resected and retrieved.                           - One less than 1 mm polyp in the cecum, removed                            with a cold biopsy forceps. Resected and retrieved.                            - Diverticulosis in the sigmoid colon, in the                            transverse colon and in the ascending colon.                           - Non-bleeding internal hemorrhoids. Recommendation:           - Patient has a contact number available for                            emergencies. The signs and symptoms of potential                            delayed complications were discussed with the                            patient. Return to normal activities tomorrow.  Written discharge instructions were provided to the                            patient.                           - Resume previous diet.                           - Continue present medications.                           - Await pathology results.                           - No repeat colonoscopy due to age. Mauri Pole, MD 11/10/2021 3:04:50 PM This report has been signed electronically.

## 2021-11-10 NOTE — Patient Instructions (Signed)
Hand outs given on polyps, diverticulosis and hiatal hernia.  Resume previous diet.  Continue present medications. Await pathology results. No repeat colonoscopy due to age.     YOU HAD AN ENDOSCOPIC PROCEDURE TODAY AT [facility]:   Refer to the procedure report that was given to you for any specific questions about what was found during the examination.  If the procedure report does not answer your questions, please call your gastroenterologist to clarify.  If you requested that your care partner not be given the details of your procedure findings, then the procedure report has been included in a sealed envelope for you to review at your convenience later.  YOU SHOULD EXPECT: Some feelings of bloating in the abdomen. Passage of more gas than usual.  Walking can help get rid of the air that was put into your GI tract during the procedure and reduce the bloating. If you had a lower endoscopy (such as a colonoscopy or flexible sigmoidoscopy) you may notice spotting of blood in your stool or on the toilet paper. If you underwent a bowel prep for your procedure, you may not have a normal bowel movement for a few days.  Please Note:  You might notice some irritation and congestion in your nose or some drainage.  This is from the oxygen used during your procedure.  There is no need for concern and it should clear up in a day or so.  SYMPTOMS TO REPORT IMMEDIATELY:  Following lower endoscopy (colonoscopy or flexible sigmoidoscopy):  Excessive amounts of blood in the stool  Significant tenderness or worsening of abdominal pains  Swelling of the abdomen that is new, acute  Fever of 100F or higher  Following upper endoscopy (EGD)  Vomiting of blood or coffee ground material  New chest pain or pain under the shoulder blades  Painful or persistently difficult swallowing  New shortness of breath  Fever of 100F or higher  Black, tarry-looking stools  For urgent or emergent issues, a  gastroenterologist can be reached at any hour by calling [phone]. Do not use MyChart messaging for urgent concerns.    DIET:  We do recommend a small meal at first, but then you may proceed to your regular diet.  Drink plenty of fluids but you should avoid alcoholic beverages for 24 hours.  ACTIVITY:  You should plan to take it easy for the rest of today and you should NOT DRIVE or use heavy machinery until tomorrow (because of the sedation medicines used during the test).    FOLLOW UP: Our staff will call the number listed on your records 48-72 hours following your procedure to check on you and address any questions or concerns that you may have regarding the information given to you following your procedure. If we do not reach you, we will leave a message.  We will attempt to reach you two times.  During this call, we will ask if you have developed any symptoms of COVID 19. If you develop any symptoms (ie: fever, flu-like symptoms, shortness of breath, cough etc.) before then, please call [phone].  If you test positive for Covid 19 in the 2 weeks post procedure, please call and report this information to [place].    If any biopsies were taken you will be contacted by phone or by letter within the next 1-3 weeks.  Please call us at [phone] if you have not heard about the biopsies in 3 weeks.    SIGNATURES/CONFIDENTIALITY: You and/or  your care partner have signed paperwork which will be entered into your electronic medical record.  These signatures attest to the fact that that the information above on your After Visit Summary has been reviewed and is understood.  Full responsibility of the confidentiality of this discharge information lies with you and/or your care-partner.

## 2021-11-10 NOTE — Op Note (Addendum)
[place] Patient Name: Melanie Hurst Procedure Date: 11/10/2021 2:09 PM MRN: [id number] Endoscopist: Mauri Pole , MD Age: 71 Referring MD:  Date of Birth: May 05, 1951 Gender: Female Account #: [id number] Procedure:                Upper GI endoscopy Indications:              Suspected upper gastrointestinal bleeding in                            patient with unexplained iron deficiency anemia Medicines:                Monitored Anesthesia Care Procedure:                Pre-Anesthesia Assessment:                           - Prior to the procedure, a History and Physical                            was performed, and patient medications and                            allergies were reviewed. The patient's tolerance of                            previous anesthesia was also reviewed. The risks                            and benefits of the procedure and the sedation                            options and risks were discussed with the patient.                            All questions were answered, and informed consent                            was obtained. Prior Anticoagulants: The patient has                            taken no previous anticoagulant or antiplatelet                            agents. ASA Grade Assessment: III - A patient with                            severe systemic disease. After reviewing the risks                            and benefits, the patient was deemed in                            satisfactory condition to undergo the procedure.  After obtaining informed consent, the endoscope was                            passed under direct vision. Throughout the                            procedure, the patient's blood pressure, pulse, and                            oxygen saturations were monitored continuously. The                            GIF [id number] #[id number] was introduced through the                            mouth,  and advanced to the second part of duodenum.                            The upper GI endoscopy was accomplished without                            difficulty. The patient tolerated the procedure                            well. Scope In: Scope Out: Findings:                 The Z-line was regular and was found 36 cm from the                            incisors.                           No gross lesions were noted in the entire esophagus.                           A large hiatal hernia was present.                           The stomach was normal.                           The cardia and gastric fundus were normal on                            retroflexion.                           A single erosion without bleeding was found in the                            duodenal bulb. Complications:            No immediate complications. Estimated Blood Loss:     Estimated blood loss was minimal. Impression:               - Z-line  regular, 36 cm from the incisors.                           - No gross lesions in esophagus.                           - Large hiatal hernia.                           - Normal stomach.                           - Duodenal erosion without bleeding.                           - No specimens collected. Recommendation:           - Patient has a contact number available for                            emergencies. The signs and symptoms of potential                            delayed complications were discussed with the                            patient. Return to normal activities tomorrow.                            Written discharge instructions were provided to the                            patient.                           - Resume previous diet.                           - Continue present medications.                           - To visualize the small bowel, perform video                            capsule endoscopy at appointment to be scheduled.                            - Follow up in GI office after small bowel capsule                            study                           - Refer to Dr.Lightfoot to discuss possible repair                            of symptomatic hiatal hernia Mauri Pole,  MD 11/10/2021 3:00:32 PM This report has been signed electronically.

## 2021-11-12 ENCOUNTER — Other Ambulatory Visit: Payer: Self-pay

## 2021-11-12 ENCOUNTER — Telehealth: Payer: Self-pay

## 2021-11-12 DIAGNOSIS — G8929 Other chronic pain: Secondary | ICD-10-CM

## 2021-11-12 DIAGNOSIS — K449 Diaphragmatic hernia without obstruction or gangrene: Secondary | ICD-10-CM

## 2021-11-12 DIAGNOSIS — D509 Iron deficiency anemia, unspecified: Secondary | ICD-10-CM

## 2021-11-12 DIAGNOSIS — R1013 Epigastric pain: Secondary | ICD-10-CM

## 2021-11-12 DIAGNOSIS — R195 Other fecal abnormalities: Secondary | ICD-10-CM

## 2021-11-12 NOTE — Telephone Encounter (Signed)
Spoke with the patient. Scheduled for the capsule endoscopy and her follow up after the study. Reviewed her instructions. Written instructions mailed. Questions invited and answered.   Referral to Dr Kipp Brood through [facility]. Follow up phone call to referral coordinator, Jenny Reichmann. Dr Kipp Brood will require a chest CT for consideration of hiatal hernia repair.  Is it okay to arrange this CT of the chest for the patient?

## 2021-11-12 NOTE — Telephone Encounter (Signed)
Yes, please order CT chest without contrast. Thanks

## 2021-11-12 NOTE — Telephone Encounter (Signed)
Patient notified. Order placed in [facility]. Staff message to Radiology scheduling to contact the patient. Any location the patient wants to have this done is okay.

## 2021-11-12 NOTE — Telephone Encounter (Signed)
°  Follow up Call-  Call back number 11/10/2021  Post procedure Call Back phone  # [phone]  Permission to leave phone message Yes  Some recent data might be hidden     Patient questions:  Do you have a fever, pain , or abdominal swelling? No. Pain Score  0 *  Have you tolerated food without any problems? Yes.    Have you been able to return to your normal activities? Yes.    Do you have any questions about your discharge instructions: Diet   No. Medications  No. Follow up visit  No.  Do you have questions or concerns about your Care? No.  Actions: * If pain score is 4 or above: No action needed, pain <4.

## 2021-11-20 ENCOUNTER — Other Ambulatory Visit: Payer: Self-pay

## 2021-11-20 ENCOUNTER — Ambulatory Visit (HOSPITAL_BASED_OUTPATIENT_CLINIC_OR_DEPARTMENT_OTHER)
Admission: RE | Admit: 2021-11-20 | Discharge: 2021-11-20 | Disposition: A | Discharge status: Home / Self Care | Payer: Medicare HMO | Source: Ambulatory Visit | Attending: Gastroenterology | Admitting: Gastroenterology

## 2021-11-20 DIAGNOSIS — D509 Iron deficiency anemia, unspecified: Secondary | ICD-10-CM | POA: Diagnosis not present

## 2021-11-20 DIAGNOSIS — K449 Diaphragmatic hernia without obstruction or gangrene: Secondary | ICD-10-CM | POA: Insufficient documentation

## 2021-11-20 DIAGNOSIS — J9811 Atelectasis: Secondary | ICD-10-CM | POA: Diagnosis not present

## 2021-11-20 DIAGNOSIS — I700 Atherosclerosis of aorta: Secondary | ICD-10-CM | POA: Diagnosis not present

## 2021-11-26 ENCOUNTER — Other Ambulatory Visit: Payer: Self-pay

## 2021-11-26 ENCOUNTER — Encounter: Payer: Self-pay | Admitting: Gastroenterology

## 2021-11-26 ENCOUNTER — Ambulatory Visit (INDEPENDENT_AMBULATORY_CARE_PROVIDER_SITE_OTHER): Payer: Medicare HMO | Admitting: Gastroenterology

## 2021-11-26 DIAGNOSIS — D509 Iron deficiency anemia, unspecified: Secondary | ICD-10-CM

## 2021-11-26 NOTE — Progress Notes (Signed)
CAPSULE ID: [id number] Exp: 2022-11-20 LOT: [id number]  Patient arrived for capsule endoscopy. Reported the prep went well. Confirmed patient is fasting. Explained dietary restrictions for the next few hours. Patient verbalized understanding. Opened capsule, ensured capsule was flashing and transmitting to the recorder prior to the patient swallowing the capsule. Patient swallowed capsule without difficulty. Patient told to call the office with any questions. Understands to return to the office today at 4:30 pm. No further questions by the conclusion of the visit.

## 2021-11-26 NOTE — Patient Instructions (Signed)
°  The capsule endoscopy procedure will last approximately 8 hours. Contact your doctor's office immediately if you suffer from any abdominal pain, nausea or vomiting during the procedure. 1. You may drink colorless liquids starting 2 hours after swallowing the capsule.  2. You may have a light snack  4 hours after ingestion. After the examination is completed you may return to your normal diet.  3. Check the blue flashing DataRecorder light a couple of times through the day. If is stops blinking or changes color, note the time and contact your doctor.  4. Avoid strong electromagnetic fields such as MRI devices or ham radios after swallowing the capsule and until you pass it in a bowel movement.  5. Do not disconnect the equipment or completely remove the DataRecorder at any time during the procedure.  6. Treat the DataRecorder carefully. Avoid sudden movements and banging the DataRecorder.  7. Avoid direct exposure to bright sunlight.  8. Return to the office at 4:30 pm to have the recording equipment removed.  Clear Liquid Diet Examples: Black Coffee (non-dairy creamer ok) Jell-O (NO fruit added & NO red Jell-o) Water Bouillon (Chicken or Beef) 7-up Cranberry Juice Apple Juice Popsicles (NO red) Tea Coke Sprite Pepsi Ginger Ale Gatorade Mt Dew Dr Pepper Light Snack Examples: Soup Cereal 1/2 Sandwich Salad Eggs Potatoes Toast Rice  

## 2021-12-01 NOTE — Progress Notes (Signed)
[address]       [place] [address]             [phone]                    Melanie Hurst [facility] Medical Record #[id number] Date of Birth: 01-21-1951  Referring: Mauri Pole, MD Primary Care: [name], MD Primary Cardiologist: Freada Bergeron, MD  Chief Complaint:    Chief Complaint  Patient presents with   Hiatal Hernia    Initial surgical consult, CT chest 2/10    History of Present Illness:    Melanie Hurst 71 y.o. female referred by Dr. Silverio Decamp for surgical evaluation of a moderate size hiatal hernia.  She states that she has had significant shortness of breath for the last 10 to 12 years, and has undergone extensive cardiac evaluation which is all been negative.  She also admits to reflux, and of late she has had 3 significant aspiration events that have woken her up from sleep.  Her biggest complaint however is significant nausea.  This has improved slightly over the last several weeks.  She denies any dysphagia or odynophagia.  She does admit to chronic cough and throat clearing in the morning.  She has been on Protonix, and this is helped with her reflux symptoms, but did not help much with her nausea.        Zubrod Score: At the time of surgery this patients most appropriate activity status/level should be described as: []     0    Normal activity, no symptoms [x]     1    Restricted in physical strenuous activity but ambulatory, able to do out light work []     2    Ambulatory and capable of self care, unable to do work activities, up and about               >50 % of waking hours                              []     3    Only limited self care, in bed greater than 50% of waking hours []     4    Completely disabled, no self care, confined to bed or chair []     5    Moribund   Past Medical History:  Diagnosis Date   Anemia    Anxiety    Depression    Fibromyalgia    GERD (gastroesophageal reflux disease)     Hypertension    IBS (irritable bowel syndrome)    Obesity    Scoliosis    Sleep apnea    cpap at 7 cm   Tremor, essential     Past Surgical History:  Procedure Laterality Date   PCOS surgery  1971   TONSILECTOMY, ADENOIDECTOMY, BILATERAL MYRINGOTOMY AND TUBES  1962    Family History  Problem Relation Age of Onset   Melanoma Mother        wrist   Other Mother        brain tumor   Stroke Mother    Colon polyps Father        not cancerous   Kidney failure Father    Breast cancer Sister    Breast cancer Paternal Aunt    Colon cancer Paternal Aunt    Irritable bowel syndrome Paternal Aunt  Stomach cancer Maternal Uncle    Esophageal cancer Neg Hx    Rectal cancer Neg Hx      Social History   Tobacco Use  Smoking Status Former   Years: 6.00   Types: Cigarettes   Quit date: 10/11/2009   Years since quitting: 12.1  Smokeless Tobacco Never    Social History   Substance and Sexual Activity  Alcohol Use Yes   Comment: Occasional wine     Allergies  Allergen Reactions   Asa [Aspirin] Hives   Codeine     Vomit     Current Outpatient Medications  Medication Sig Dispense Refill   ARIPiprazole (ABILIFY) 5 MG tablet Take 1 tablet (5 mg total) by mouth daily. 90 tablet 3   losartan (COZAAR) 50 MG tablet Take 1 tablet (50 mg total) by mouth 2 (two) times daily. 180 tablet 3   modafinil (PROVIGIL) 200 MG tablet Take 1 tablet (200 mg total) by mouth daily. 90 tablet 1   ondansetron (ZOFRAN-ODT) 4 MG disintegrating tablet Take 1 tablet every 4-6 hours as needed 30 tablet 5   pantoprazole (PROTONIX) 40 MG tablet Take 1 tablet (40 mg total) by mouth 2 (two) times daily. 60 tablet 5   venlafaxine XR (EFFEXOR-XR) 75 MG 24 hr capsule TAKE 1 CAPSULE EVERY       MORNING AND AT BEDTIME (Patient taking differently: Take 2 capsules in the morning and 1 at night) 180 capsule 1   No current facility-administered medications for this visit.    Review of Systems  Constitutional:   Positive for malaise/fatigue and weight loss.  HENT:  Negative for sore throat.   Respiratory:  Positive for cough and shortness of breath.   Cardiovascular:  Negative for chest pain and palpitations.  Gastrointestinal:  Positive for heartburn, nausea and vomiting. Negative for abdominal pain.  Musculoskeletal:  Positive for back pain, joint pain and myalgias.  Neurological:  Positive for tremors. Negative for dizziness.    PHYSICAL EXAMINATION: BP (!) 160/80    Pulse 100    Resp 20    Ht 5\' 6"  (1.676 m)    Wt 274 lb (124.3 kg)    SpO2 95% Comment: RA   BMI 44.22 kg/m  Physical Exam Constitutional:      General: She is not in acute distress.    Appearance: She is obese. She is not ill-appearing or toxic-appearing.  HENT:     Head: Normocephalic and atraumatic.  Eyes:     Extraocular Movements: Extraocular movements intact.  Cardiovascular:     Rate and Rhythm: Tachycardia present.  Pulmonary:     Effort: Pulmonary effort is normal. No respiratory distress.  Abdominal:     General: There is no distension.  Musculoskeletal:        General: Normal range of motion.     Cervical back: Normal range of motion.     Comments: Walks with a cane.  Neurological:     General: No focal deficit present.     Mental Status: She is alert and oriented to person, place, and time.     Gait: Gait abnormal.     Comments: Resting tremor    Diagnostic Studies & Laboratory data:    CT Scan: 11/20/21    EGD/EUS:  Jan 2023 - Z-line regular, 36 cm from the incisors. - No gross lesions in esophagus. - Large hiatal hernia. - Normal stomach. - Duodenal erosion without bleeding. - No specimens collected.  I have independently reviewed the above radiology studies  and reviewed the findings with the patient.   Recent Lab Findings: Lab Results  Component Value Date   WBC 6.2 07/27/2021   HGB 12.5 07/27/2021   HCT 40.4 07/27/2021   PLT 407 (H) 07/27/2021   GLUCOSE 106 (H) 07/27/2021    CHOL 197 11/04/2020   TRIG 169 (H) 11/04/2020   HDL 43 (L) 11/04/2020   LDLCALC 125 (H) 11/04/2020   ALT 10 07/27/2021   AST 10 07/27/2021   NA 142 07/27/2021   K 5.8 (H) 07/27/2021   CL 106 07/27/2021   CREATININE 0.84 07/27/2021   BUN 7 07/27/2021   CO2 28 07/27/2021   TSH 1.96 04/19/2017       Assessment / Plan:   71 year old female with a moderate size hiatal hernia, nausea gastroesophageal reflux with 3 episodes of aspiration.  I personally reviewed her EGD, and cross-sectional imaging.  She was noted to have a duodenal ulcer which was partially healed.  She was placed on Carafate and states that her nausea has improved over the last several weeks.  In regards to her hiatal hernia I think that she would benefit robotic assisted paraesophageal hernia repair with toupee fundoplication.  This definitely would reduce her risk of further aspiration events and also her reflux symptoms.  In regards to her shortness of breath I think this could be due to silent microaspiration's that she has been experiencing, and this should also improve after hernia repair.  I am not certain as to the source of her nausea and explained to her that if it was due to the duodenal ulcer that she may continue to have the symptoms.  She is tentatively scheduled for December 09, 2021.     I  spent 40 minutes with the patient face to face counseling and coordination of care.    Lajuana Matte 12/04/2021 10:11 AM

## 2021-12-04 ENCOUNTER — Institutional Professional Consult (permissible substitution): Payer: Medicare HMO | Admitting: Thoracic Surgery (Cardiothoracic Vascular Surgery)

## 2021-12-04 ENCOUNTER — Other Ambulatory Visit: Payer: Self-pay

## 2021-12-04 ENCOUNTER — Other Ambulatory Visit: Payer: Self-pay | Admitting: *Deleted

## 2021-12-04 ENCOUNTER — Encounter: Payer: Self-pay | Admitting: *Deleted

## 2021-12-04 VITALS — BP 160/80 | HR 100 | Resp 20 | Ht 66.0 in | Wt 274.0 lb

## 2021-12-04 DIAGNOSIS — K449 Diaphragmatic hernia without obstruction or gangrene: Secondary | ICD-10-CM

## 2021-12-07 ENCOUNTER — Telehealth: Payer: Self-pay

## 2021-12-07 NOTE — Telephone Encounter (Signed)
[facility] faxed in a refill request for   losartan (COZAAR) 50 MG tablet [[id number]]    Order Details Dose: 50 mg Route: Oral Frequency: 2 times daily  Dispense Quantity: 180 tablet Refills: 3        Sig: Take 1 tablet (50 mg total) by mouth 2 (two) times daily.

## 2021-12-07 NOTE — Telephone Encounter (Signed)
Rx was sent to [facility] 11/03/21 per patient request. This was for one year supply, patient should not need refills at this time.

## 2021-12-10 ENCOUNTER — Encounter: Payer: Self-pay | Admitting: Gastroenterology

## 2021-12-10 ENCOUNTER — Ambulatory Visit: Payer: Medicare HMO | Admitting: Gastroenterology

## 2021-12-10 VITALS — BP 118/76 | HR 70 | Ht 66.0 in | Wt 272.0 lb

## 2021-12-10 DIAGNOSIS — D509 Iron deficiency anemia, unspecified: Secondary | ICD-10-CM | POA: Diagnosis not present

## 2021-12-10 DIAGNOSIS — K219 Gastro-esophageal reflux disease without esophagitis: Secondary | ICD-10-CM | POA: Diagnosis not present

## 2021-12-10 DIAGNOSIS — K449 Diaphragmatic hernia without obstruction or gangrene: Secondary | ICD-10-CM | POA: Diagnosis not present

## 2021-12-10 NOTE — Patient Instructions (Signed)
Take Iron daily  Gastroesophageal Reflux Disease, Adult Gastroesophageal reflux (GER) happens when acid from the stomach flows up into the tube that connects the mouth and the stomach (esophagus). Normally, food travels down the esophagus and stays in the stomach to be digested. However, when a person has GER, food and stomach acid sometimes move back up into the esophagus. If this becomes a more serious problem, the person may be diagnosed with a disease called gastroesophageal reflux disease (GERD). GERD occurs when the reflux: Happens often. Causes frequent or severe symptoms. Causes problems such as damage to the esophagus. When stomach acid comes in contact with the esophagus, the acid may cause inflammation in the esophagus. Over time, GERD may create small holes (ulcers) in the lining of the esophagus. What are the causes? This condition is caused by a problem with the muscle between the esophagus and the stomach (lower esophageal sphincter, or LES). Normally, the LES muscle closes after food passes through the esophagus to the stomach. When the LES is weakened or abnormal, it does not close properly, and that allows food and stomach acid to go back up into the esophagus. The LES can be weakened by certain dietary substances, medicines, and medical conditions, including: Tobacco use. Pregnancy. Having a hiatal hernia. Alcohol use. Certain foods and beverages, such as coffee, chocolate, onions, and peppermint. What increases the risk? You are more likely to develop this condition if you: Have an increased body weight. Have a connective tissue disorder. Take NSAIDs, such as ibuprofen. What are the signs or symptoms? Symptoms of this condition include: Heartburn. Difficult or painful swallowing and the feeling of having a lump in the throat. A bitter taste in the mouth. Bad breath and having a large amount of saliva. Having an upset or bloated stomach and belching. Chest pain.  Different conditions can cause chest pain. Make sure you see your health care provider if you experience chest pain. Shortness of breath or wheezing. Ongoing (chronic) cough or a nighttime cough. Wearing away of tooth enamel. Weight loss. How is this diagnosed? This condition may be diagnosed based on a medical history and a physical exam. To determine if you have mild or severe GERD, your health care provider may also monitor how you respond to treatment. You may also have tests, including: A test to examine your stomach and esophagus with a small camera (endoscopy). A test that measures the acidity level in your esophagus. A test that measures how much pressure is on your esophagus. A barium swallow or modified barium swallow test to show the shape, size, and functioning of your esophagus. How is this treated? Treatment for this condition may vary depending on how severe your symptoms are. Your health care provider may recommend: Changes to your diet. Medicine. Surgery. The goal of treatment is to help relieve your symptoms and to prevent complications. Follow these instructions at home: Eating and drinking  Follow a diet as recommended by your health care provider. This may involve avoiding foods and drinks such as: Coffee and tea, with or without caffeine. Drinks that contain alcohol. Energy drinks and sports drinks. Carbonated drinks or sodas. Chocolate and cocoa. Peppermint and mint flavorings. Garlic and onions. Horseradish. Spicy and acidic foods, including peppers, chili powder, curry powder, vinegar, hot sauces, and barbecue sauce. Citrus fruit juices and citrus fruits, such as oranges, lemons, and limes. Tomato-based foods, such as red sauce, chili, salsa, and pizza with red sauce. Fried and fatty foods, such as donuts, french fries, potato  chips, and high-fat dressings. High-fat meats, such as hot dogs and fatty cuts of red and white meats, such as rib eye steak, sausage,  ham, and bacon. High-fat dairy items, such as whole milk, butter, and cream cheese. Eat small, frequent meals instead of large meals. Avoid drinking large amounts of liquid with your meals. Avoid eating meals during the 2-3 hours before bedtime. Avoid lying down right after you eat. Do not exercise right after you eat. Lifestyle  Do not use any products that contain nicotine or tobacco. These products include cigarettes, chewing tobacco, and vaping devices, such as e-cigarettes. If you need help quitting, ask your health care provider. Try to reduce your stress by using methods such as yoga or meditation. If you need help reducing stress, ask your health care provider. If you are overweight, reduce your weight to an amount that is healthy for you. Ask your health care provider for guidance about a safe weight loss goal. General instructions Pay attention to any changes in your symptoms. Take over-the-counter and prescription medicines only as told by your health care provider. Do not take aspirin, ibuprofen, or other NSAIDs unless your health care provider told you to take these medicines. Wear loose-fitting clothing. Do not wear anything tight around your waist that causes pressure on your abdomen. Raise (elevate) the head of your bed about 6 inches (15 cm). You can use a wedge to do this. Avoid bending over if this makes your symptoms worse. Keep all follow-up visits. This is important. Contact a health care provider if: You have: New symptoms. Unexplained weight loss. Difficulty swallowing or it hurts to swallow. Wheezing or a persistent cough. A hoarse voice. Your symptoms do not improve with treatment. Get help right away if: You have sudden pain in your arms, neck, jaw, teeth, or back. You suddenly feel sweaty, dizzy, or light-headed. You have chest pain or shortness of breath. You vomit and the vomit is green, yellow, or black, or it looks like blood or coffee grounds. You  faint. You have stool that is red, bloody, or black. You cannot swallow, drink, or eat. These symptoms may represent a serious problem that is an emergency. Do not wait to see if the symptoms will go away. Get medical help right away. Call your local emergency services (911 in the [place] Do not drive yourself to the hospital. Summary Gastroesophageal reflux happens when acid from the stomach flows up into the esophagus. GERD is a disease in which the reflux happens often, causes frequent or severe symptoms, or causes problems such as damage to the esophagus. Treatment for this condition may vary depending on how severe your symptoms are. Your health care provider may recommend diet and lifestyle changes, medicine, or surgery. Contact a health care provider if you have new or worsening symptoms. Take over-the-counter and prescription medicines only as told by your health care provider. Do not take aspirin, ibuprofen, or other NSAIDs unless your health care provider told you to do so. Keep all follow-up visits as told by your health care provider. This is important. This information is not intended to replace advice given to you by your health care provider. Make sure you discuss any questions you have with your health care provider. Document Revised: 04/07/2020 Document Reviewed: 04/07/2020 Elsevier Patient Education  [place].   If you are age 47 or older, your body mass index should be between 23-30. Your Body mass index is 43.9 kg/m. If this is out of the aforementioned  range listed, please consider follow up with your Primary Care Provider.  If you are age 61 or younger, your body mass index should be between 19-25. Your Body mass index is 43.9 kg/m. If this is out of the aformentioned range listed, please consider follow up with your Primary Care Provider.   ________________________________________________________  The [facility] providers would like to encourage you to use  [facility] to communicate with providers for non-urgent requests or questions.  Due to long hold times on the telephone, sending your provider a message by [facility] may be a faster and more efficient way to get a response.  Please allow 48 business hours for a response.  Please remember that this is for non-urgent requests.  _______________________________________________________   I appreciate the  opportunity to care for you  Thank You   Harl Bowie , MD

## 2021-12-10 NOTE — Progress Notes (Signed)
? ?       ? ?Melanie Hurst    [id number]    05/04/1951 ? ?Primary Care Physician:Pickard, Cammie Mcgee, MD ? ?Referring Physician: [name], MD ?[address] [place],  [place] [address] ? ? ?Chief complaint:  Iron deficiency anemia, large hiatal hernia ? ?HPI: ?71 year old very pleasant female here for follow-up visit for iron deficiency anemia large hiatal hernia ?Overall she is doing well with current regimen and antireflux measures. ?Denies any nausea, vomiting, abdominal pain, melena or bright red blood per rectum ? ?EGD November 10, 2021 ?Z-line regular, 36 cm from the incisors. ?- No gross lesions in esophagus. ?- Large hiatal hernia. ?- Normal stomach. ?- Duodenal erosion without bleeding. ? ?Colonoscopy November 10, 2021 ?- One 7 mm polyp at the hepatic flexure, removed with a cold snare. Resected and retrieved. ?- One less than 1 mm polyp in the cecum, removed with a cold biopsy forceps. Resected and ?retrieved. ?- Diverticulosis in the sigmoid colon, in the transverse colon and in the ascending colon. ?- Non-bleeding internal hemorrhoids. ? ?Small bowel video capsule November 26, 2021 ?1 small nonbleeding angiectasia in distal small bowel otherwise unremarkable exam ? ?CT chest without contrast November 20, 2021: ?1. No acute findings. ?2. Moderate size hiatal hernia accompanied by herniated peritoneal ?fat. ? ?Iron/TIBC/Ferritin/ %Sat ?   ?Component Value Date/Time  ? IRON 45 07/27/2021 1133  ? TIBC 471 (H) 07/27/2021 1133  ? FERRITIN 5 (L) 07/27/2021 1133  ? IRONPCTSAT 10 (L) 07/27/2021 1133  ?  ? ?Outpatient Encounter Medications as of 12/10/2021  ?Medication Sig  ? ARIPiprazole (ABILIFY) 5 MG tablet Take 1 tablet (5 mg total) by mouth daily.  ? losartan (COZAAR) 50 MG tablet Take 1 tablet (50 mg total) by mouth 2 (two) times daily.  ? modafinil (PROVIGIL) 200 MG tablet Take 1 tablet (200 mg total) by mouth daily.  ? ondansetron (ZOFRAN-ODT) 4 MG disintegrating tablet Take 1 tablet every 4-6  hours as needed  ? pantoprazole (PROTONIX) 40 MG tablet Take 1 tablet (40 mg total) by mouth 2 (two) times daily.  ? venlafaxine XR (EFFEXOR-XR) 75 MG 24 hr capsule TAKE 1 CAPSULE EVERY       MORNING AND AT BEDTIME (Patient taking differently: Take 2 capsules in the morning and 1 at night)  ? ?No facility-administered encounter medications on file as of 12/10/2021.  ? ? ?Allergies as of 12/10/2021 - Review Complete 12/04/2021  ?Allergen Reaction Noted  ? Asa [aspirin] Hives 08/19/2017  ? Codeine  03/15/2013  ? ? ?Past Medical History:  ?Diagnosis Date  ? Anemia   ? Anxiety   ? Depression   ? Fibromyalgia   ? GERD (gastroesophageal reflux disease)   ? Hypertension   ? IBS (irritable bowel syndrome)   ? Obesity   ? Scoliosis   ? Sleep apnea   ? cpap at 7 cm  ? Tremor, essential   ? ? ?Past Surgical History:  ?Procedure Laterality Date  ? PCOS surgery  1971  ? TONSILECTOMY, ADENOIDECTOMY, BILATERAL MYRINGOTOMY AND TUBES  1962  ? ? ?Family History  ?Problem Relation Age of Onset  ? Melanoma Mother   ?     wrist  ? Other Mother   ?     brain tumor  ? Stroke Mother   ? Colon polyps Father   ?     not cancerous  ? Kidney failure Father   ? Breast cancer Sister   ? Breast cancer  Paternal Aunt   ? Colon cancer Paternal Aunt   ? Irritable bowel syndrome Paternal Aunt   ? Stomach cancer Maternal Uncle   ? Esophageal cancer Neg Hx   ? Rectal cancer Neg Hx   ? ? ?Social History  ? ?Socioeconomic History  ? Marital status: Married  ?  Spouse name: Not on file  ? Number of children: 0  ? Years of education: Not on file  ? Highest education level: Not on file  ?Occupational History  ? Occupation: retired  ?Tobacco Use  ? Smoking status: Former  ?  Years: 6.00  ?  Types: Cigarettes  ?  Quit date: 10/11/2009  ?  Years since quitting: 12.1  ? Smokeless tobacco: Never  ?Vaping Use  ? Vaping Use: Former  ? Devices: vaped to stop smoking  ?Substance and Sexual Activity  ? Alcohol use: Yes  ?  Comment: Occasional wine  ? Drug use: No  ?  Sexual activity: Not Currently  ?Other Topics Concern  ? Not on file  ?Social History Narrative  ? Not on file  ? ?Social Determinants of Health  ? ?Financial Resource Strain: Not on file  ?Food Insecurity: Not on file  ?Transportation Needs: Not on file  ?Physical Activity: Not on file  ?Stress: Not on file  ?Social Connections: Not on file  ?Intimate Partner Violence: Not on file  ? ? ? ? ?Review of systems: ?All other review of systems negative except as mentioned in the HPI. ? ? ?Physical Exam: ?Vitals:  ? 12/10/21 1030  ?BP: 118/76  ?Pulse: 70  ? ?Body mass index is 43.9 kg/m?. ?Gen:      No acute distress ?HEENT:  sclera anicteric ?Abd:      soft, non-tender; no palpable masses, no distension ?Ext:    No edema ?Neuro: alert and oriented x 3 ?Psych: normal mood and affect ? ?Data Reviewed: ? ?Reviewed labs, radiology imaging, old records and pertinent past GI work up ? ? ?Assessment and Plan/Recommendations: ? 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 ?Patient was referred to Dr. Kipp Brood for hiatal hernia repair,  she is scheduled to undergo surgery later this month ? ?Continue oral iron and B12 for iron and B12 deficiency ?Continue to daily pantoprazole, 30 minutes before breakfast ?Discussed antireflux measures and lifestyle modifications ? ?Return in 3 months ? ? The patient was provided an opportunity to ask questions and all were answered. The patient agreed with the plan and demonstrated an understanding of the instructions. ? ?K. Denzil Magnuson , MD ?  ? ?CC: [name], MD ? ? ?

## 2021-12-13 IMAGING — CR DG ABDOMEN 1V
3 series · 3 of 3 positions shown · non-contrast
Comparison: None.

CLINICAL DATA: Left-sided acute abdominal pain.  Pain for 1 week.

EXAM:
ABDOMEN - 1 VIEW

[t abdomen supine (1 of 3)]
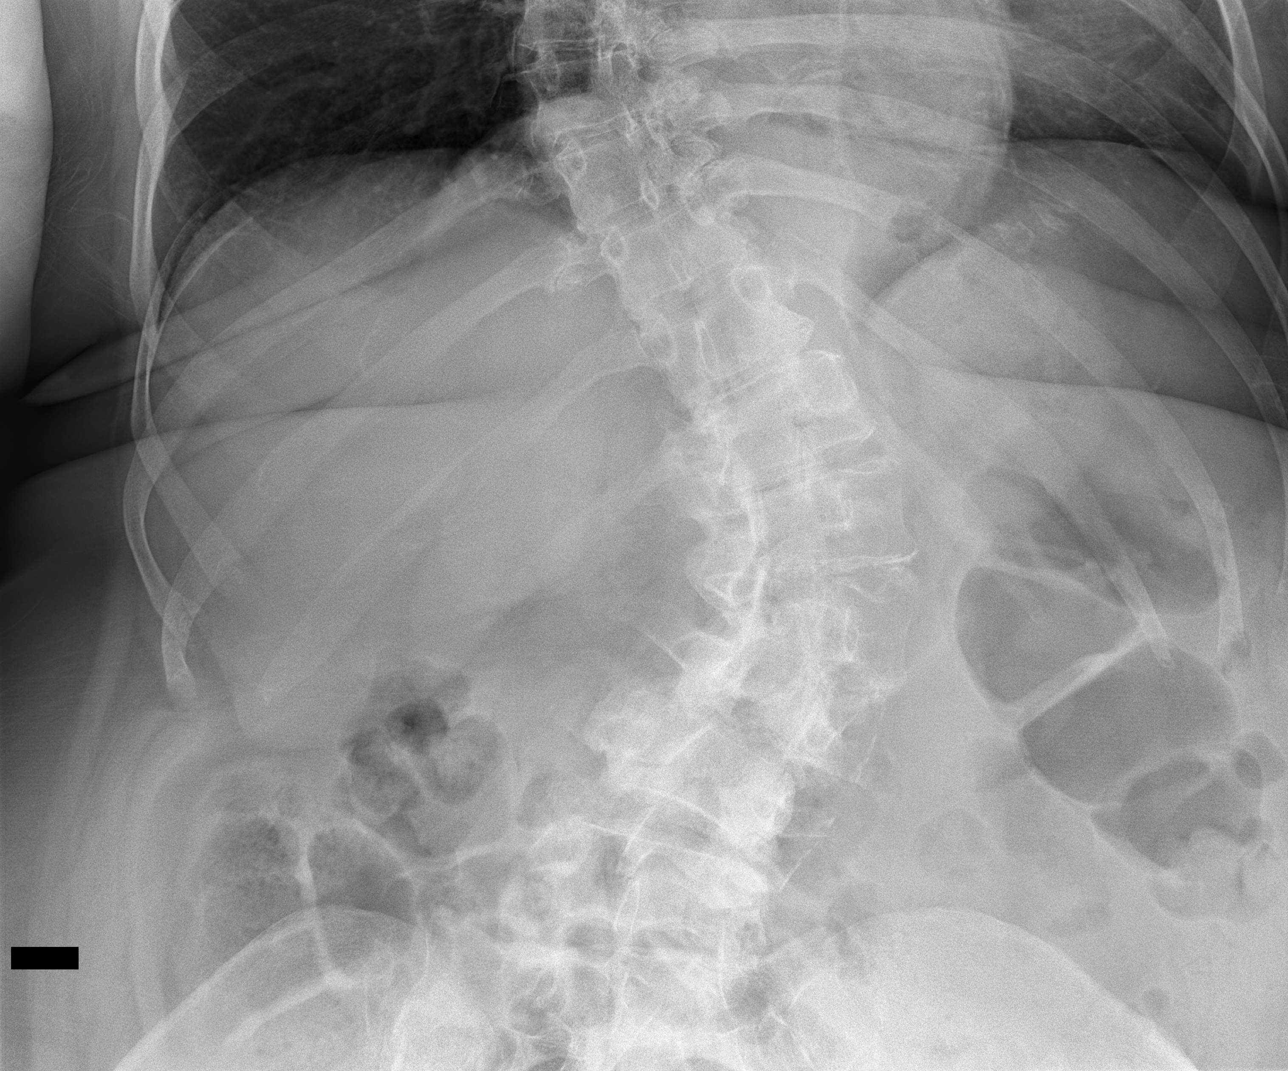

[t abdomen supine (2 of 3)]
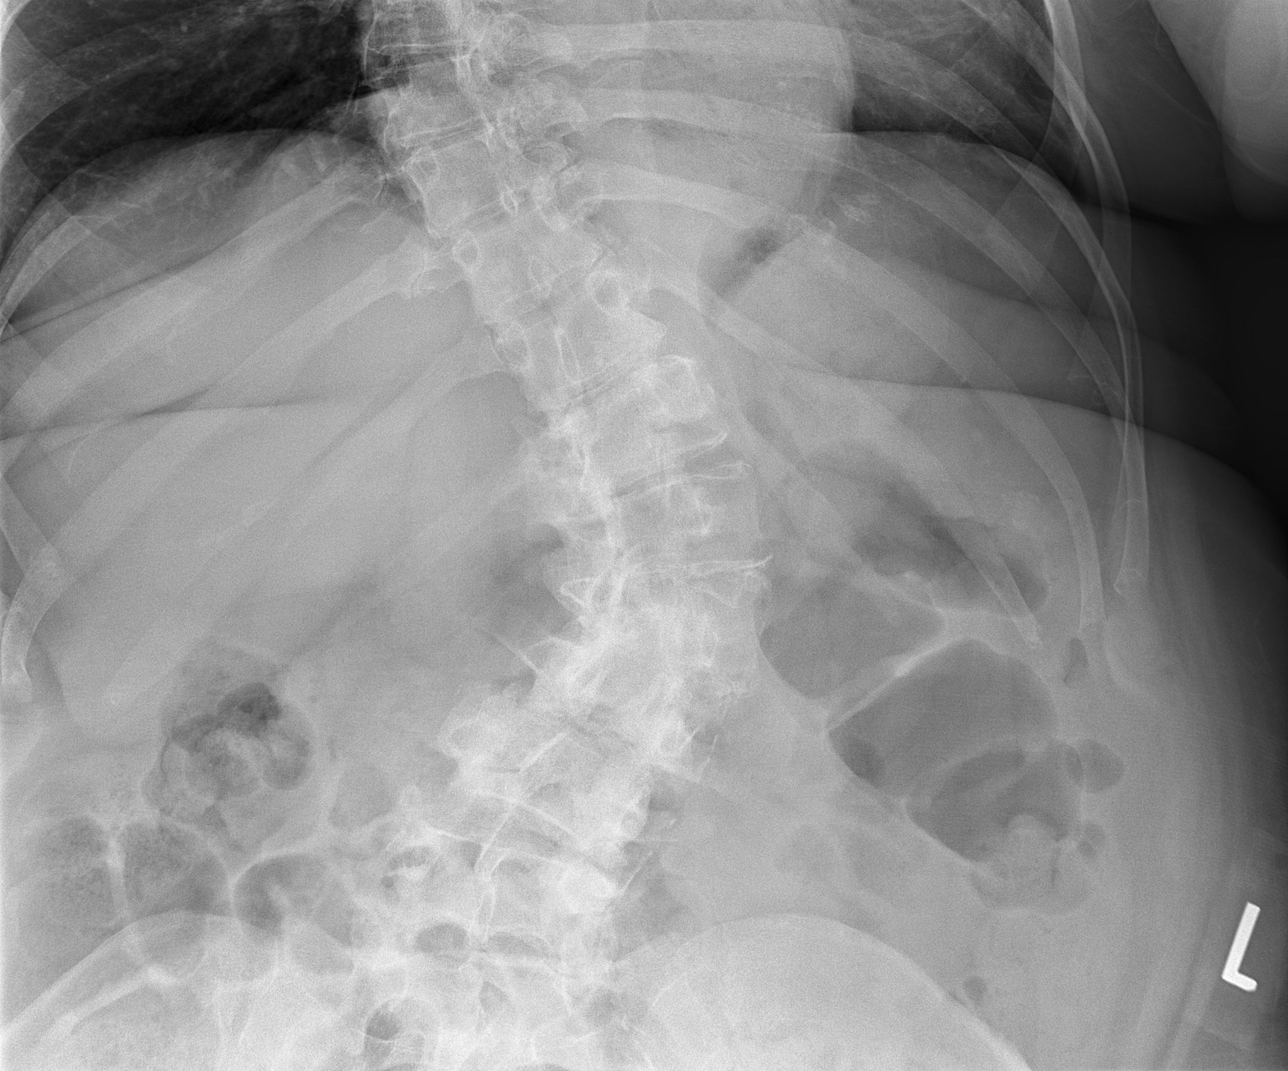

[t abdomen supine (3 of 3)]
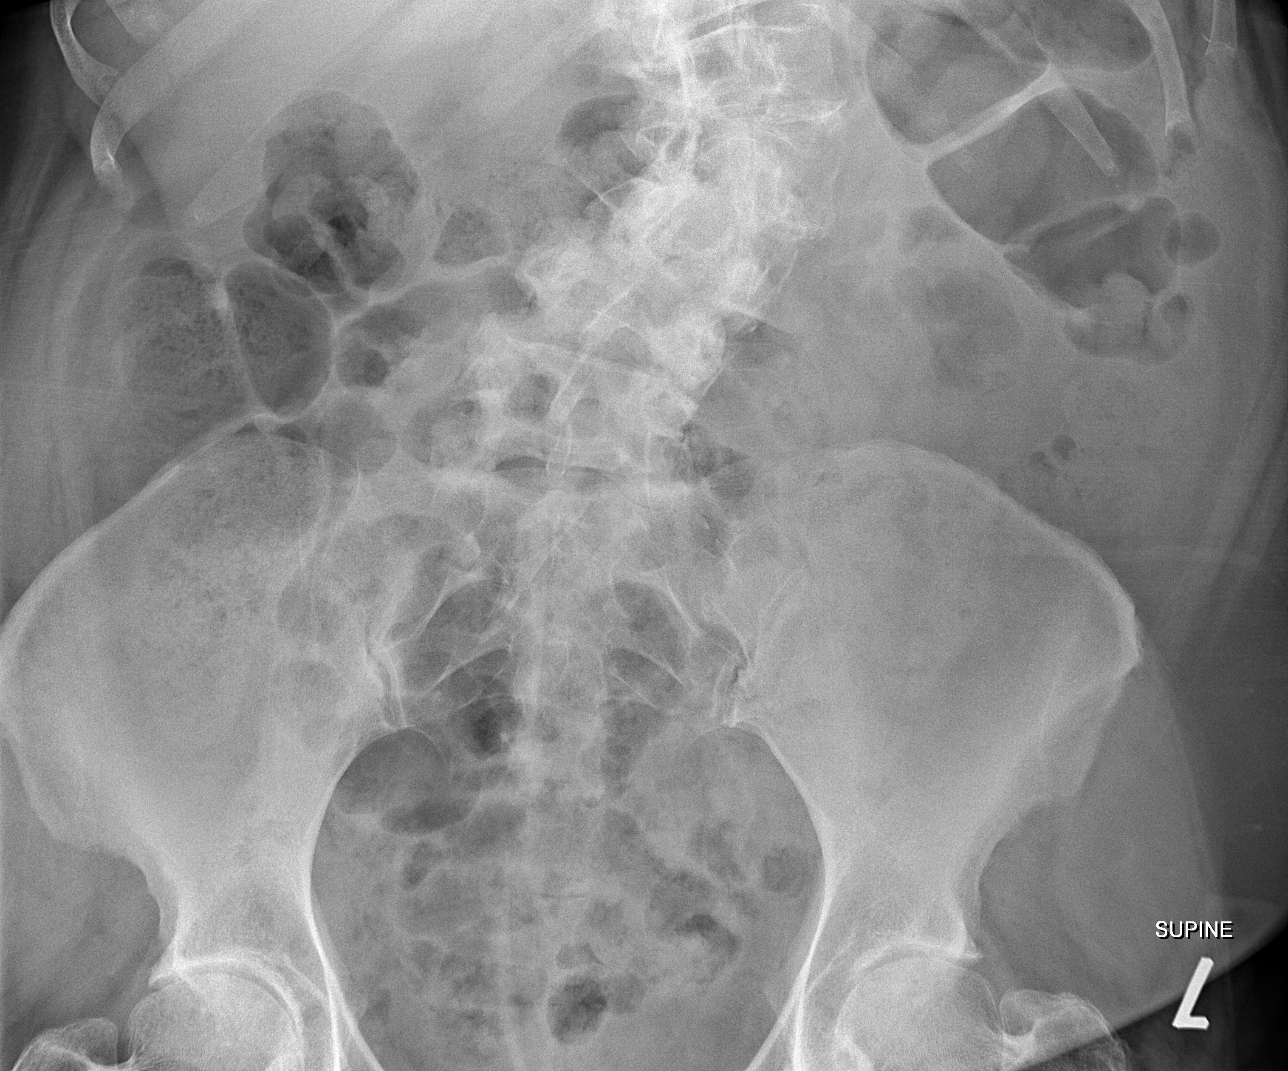

[3 of 3 positions shown; findings below may reference images not displayed]

FINDINGS: Scoliotic curvature of the spine distorts normal anatomy. 5 mm
calcification to the left of L[DATE] be a granuloma in the spleen or
left renal stone given distorts anatomy. No other radiopaque
calculi. Normal bowel gas pattern. Moderate stool in the ascending
and distal descending colon. No obstruction or free air. Lung bases
are clear. Moderately severe scoliotic curvature of the spine. No
acute osseous abnormalities are seen.
IMPRESSION: 1. Left upper quadrant 5 mm calcification. May represent a granuloma
in the spleen, vascular calcification, or potentially left renal
stone given distorted anatomy related to scoliosis.
2. Normal bowel gas pattern.

## 2021-12-18 ENCOUNTER — Encounter: Payer: Self-pay | Admitting: Family Medicine

## 2021-12-18 ENCOUNTER — Other Ambulatory Visit: Payer: Self-pay

## 2021-12-18 ENCOUNTER — Ambulatory Visit (INDEPENDENT_AMBULATORY_CARE_PROVIDER_SITE_OTHER): Payer: Medicare HMO | Admitting: Family Medicine

## 2021-12-18 VITALS — BP 148/92 | HR 95 | Temp 96.8°F | Resp 18 | Ht 66.0 in | Wt 272.0 lb

## 2021-12-18 DIAGNOSIS — E611 Iron deficiency: Secondary | ICD-10-CM

## 2021-12-18 DIAGNOSIS — G4710 Hypersomnia, unspecified: Secondary | ICD-10-CM | POA: Diagnosis not present

## 2021-12-18 DIAGNOSIS — I10 Essential (primary) hypertension: Secondary | ICD-10-CM | POA: Diagnosis not present

## 2021-12-18 MED ORDER — MODAFINIL 200 MG PO TABS: 200.0000 mg | ORAL_TABLET | Freq: Two times a day (BID) | ORAL | 1 refills | Status: DC

## 2021-12-18 NOTE — Progress Notes (Addendum)
? ?Subjective:  ? ? Patient ID: Melanie Hurst, female    DOB: 10-08-1951, 71 y.o.   MRN: [id number] ?Patient has a history of severe depression currently being treated with Effexor and Abilify.  She is taking her Abilify in the morning.  She reports hypersomnolence despite taking Provigil 200 mg p.o. every morning.  She questions if she may have narcolepsy.  She has had a split-level sleep study in the past that did show obstructive sleep apnea with an apnea-hypopnea index of 11.  She is currently wearing her CPAP consistently.  She has not seen her neurologist since the original sleep study.  She denies any seizure activities.  She denies any syncopal episodes.  Blood pressure here today is elevated and she has not been checking her blood pressure regularly.  However she finds herself falling asleep during the day easily/at the drop of a hat.  She states that she will be reading or watching TV and fall asleep with no warning ?Past Medical History:  ?Diagnosis Date  ? Anemia   ? Anxiety   ? Depression   ? Fibromyalgia   ? GERD (gastroesophageal reflux disease)   ? Hypertension   ? IBS (irritable bowel syndrome)   ? Obesity   ? Scoliosis   ? Sleep apnea   ? cpap at 7 cm  ? Tremor, essential   ? ?Past Surgical History:  ?Procedure Laterality Date  ? PCOS surgery  1971  ? TONSILECTOMY, ADENOIDECTOMY, BILATERAL MYRINGOTOMY AND TUBES  1962  ? ?Current Outpatient Medications on File Prior to Visit  ?Medication Sig Dispense Refill  ? AMBULATORY NON FORMULARY MEDICATION Medication Name: Fesol complete (Iron) one tablet by mouth once daily    ? ARIPiprazole (ABILIFY) 5 MG tablet Take 1 tablet (5 mg total) by mouth daily. 90 tablet 3  ? losartan (COZAAR) 50 MG tablet Take 1 tablet (50 mg total) by mouth 2 (two) times daily. 180 tablet 3  ? modafinil (PROVIGIL) 200 MG tablet Take 1 tablet (200 mg total) by mouth daily. 90 tablet 1  ? pantoprazole (PROTONIX) 40 MG tablet Take 1 tablet (40 mg total) by mouth 2 (two) times daily.  60 tablet 5  ? venlafaxine XR (EFFEXOR-XR) 75 MG 24 hr capsule TAKE 1 CAPSULE EVERY       MORNING AND AT BEDTIME (Patient taking differently: Take 2 capsules in the morning and 1 at night) 180 capsule 1  ? ondansetron (ZOFRAN-ODT) 4 MG disintegrating tablet Take 1 tablet every 4-6 hours as needed (Patient not taking: Reported on 12/18/2021) 30 tablet 5  ? ?No current facility-administered medications on file prior to visit.  ? ?Allergies  ?Allergen Reactions  ? Asa [Aspirin] Hives  ? Codeine   ?  Vomit   ? ?Social History  ? ?Socioeconomic History  ? Marital status: Married  ?  Spouse name: Not on file  ? Number of children: 0  ? Years of education: Not on file  ? Highest education level: Not on file  ?Occupational History  ? Occupation: retired  ?Tobacco Use  ? Smoking status: Former  ?  Years: 6.00  ?  Types: Cigarettes  ?  Quit date: 10/11/2009  ?  Years since quitting: 12.1  ? Smokeless tobacco: Never  ?Vaping Use  ? Vaping Use: Former  ? Devices: vaped to stop smoking  ?Substance and Sexual Activity  ? Alcohol use: Yes  ?  Comment: Occasional wine  ? Drug use: No  ? Sexual activity: Not Currently  ?  Other Topics Concern  ? Not on file  ?Social History Narrative  ? Not on file  ? ?Social Determinants of Health  ? ?Financial Resource Strain: Not on file  ?Food Insecurity: Not on file  ?Transportation Needs: Not on file  ?Physical Activity: Not on file  ?Stress: Not on file  ?Social Connections: Not on file  ?Intimate Partner Violence: Not on file  ? ? ? ? ?Review of Systems  ?All other systems reviewed and are negative. ? ?   ?Objective:  ? Physical Exam ?Vitals reviewed.  ?Constitutional:   ?   Appearance: She is well-developed. She is obese. She is not diaphoretic.  ?Cardiovascular:  ?   Rate and Rhythm: Normal rate and regular rhythm.  ?   Heart sounds: Normal heart sounds. No murmur heard. ?Pulmonary:  ?   Effort: Pulmonary effort is normal. No respiratory distress.  ?   Breath sounds: Normal breath sounds. No  wheezing.  ?Abdominal:  ?   General: Bowel sounds are normal. There is no distension.  ?   Palpations: Abdomen is soft.  ?   Tenderness: There is no abdominal tenderness. There is no rebound.  ?Musculoskeletal:  ?   Right lower leg: No edema.  ?   Left lower leg: No edema.  ?Neurological:  ?   Mental Status: She is alert and oriented to person, place, and time.  ?   Cranial Nerves: No cranial nerve deficit.  ?   Motor: No abnormal muscle tone.  ?   Coordination: Coordination normal.  ?   Deep Tendon Reflexes: Reflexes are normal and symmetric.  ? ? ? ? ? ?   ?Assessment & Plan:  ?Primary hypertension - Plan: CBC with Differential/Platelet, COMPLETE METABOLIC PANEL WITH GFR, Iron ? ?Iron deficiency - Plan: CBC with Differential/Platelet, COMPLETE METABOLIC PANEL WITH GFR, Iron ? ?Hypersomnolence disorder ?I agreed to increase Provigil to 200 mg twice daily as long as the patient began checking her blood pressure every day and record for values to me in 1 week.  If persistently elevated will need to increase medication to obtain a blood pressure less than 140/90.  She has a history of iron deficiency anemia so I would like to check CBC, CMP, and iron level today.  If the patient does not experience benefit from increasing Provigil, I would recommend a referral back to her neurologist for screening for possible narcolepsy. ?

## 2021-12-19 LAB — CBC WITH DIFFERENTIAL/PLATELET
Absolute Monocytes: 544 cells/uL (ref 200–950)
Basophils Absolute: 60 cells/uL (ref 0–200)
Basophils Relative: 0.7 %
Eosinophils Absolute: 170 cells/uL (ref 15–500)
Eosinophils Relative: 2 %
HCT: 42.2 % (ref 35.0–45.0)
Hemoglobin: 13.4 g/dL (ref 11.7–15.5)
Lymphs Abs: 2355 cells/uL (ref 850–3900)
MCH: 25.4 pg — ABNORMAL LOW (ref 27.0–33.0)
MCHC: 31.8 g/dL — ABNORMAL LOW (ref 32.0–36.0)
MCV: 80.1 fL (ref 80.0–100.0)
MPV: 10.1 fL (ref 7.5–12.5)
Monocytes Relative: 6.4 %
Neutro Abs: 5372 cells/uL (ref 1500–7800)
Neutrophils Relative %: 63.2 %
Platelets: 374 10*3/uL (ref 140–400)
RBC: 5.27 10*6/uL — ABNORMAL HIGH (ref 3.80–5.10)
RDW: 15.8 % — ABNORMAL HIGH (ref 11.0–15.0)
Total Lymphocyte: 27.7 %
WBC: 8.5 10*3/uL (ref 3.8–10.8)

## 2021-12-19 LAB — COMPLETE METABOLIC PANEL WITH GFR
AG Ratio: 1.9 (calc) (ref 1.0–2.5)
ALT: 7 U/L (ref 6–29)
AST: 11 U/L (ref 10–35)
Albumin: 4.3 g/dL (ref 3.6–5.1)
Alkaline phosphatase (APISO): 123 U/L (ref 37–153)
BUN: 19 mg/dL (ref 7–25)
CO2: 28 mmol/L (ref 20–32)
Calcium: 11 mg/dL — ABNORMAL HIGH (ref 8.6–10.4)
Chloride: 104 mmol/L (ref 98–110)
Creat: 0.97 mg/dL (ref 0.60–1.00)
Globulin: 2.3 g/dL (calc) (ref 1.9–3.7)
Glucose, Bld: 92 mg/dL (ref 65–99)
Potassium: 4.6 mmol/L (ref 3.5–5.3)
Sodium: 139 mmol/L (ref 135–146)
Total Bilirubin: 0.4 mg/dL (ref 0.2–1.2)
Total Protein: 6.6 g/dL (ref 6.1–8.1)
eGFR: 63 mL/min/{1.73_m2} (ref >=60)

## 2021-12-19 LAB — IRON: Iron: 53 ug/dL (ref 45–160)

## 2021-12-21 ENCOUNTER — Encounter: Payer: Self-pay | Admitting: Gastroenterology

## 2021-12-21 ENCOUNTER — Other Ambulatory Visit (HOSPITAL_COMMUNITY): Payer: Medicare HMO

## 2021-12-23 ENCOUNTER — Ambulatory Visit: Admit: 2021-12-23 | Payer: Medicare HMO | Admitting: Thoracic Surgery (Cardiothoracic Vascular Surgery)

## 2021-12-23 SURGERY — REPAIR, HERNIA, PARAESOPHAGEAL, ROBOT-ASSISTED
Anesthesia: General | Site: Chest

## 2022-01-06 ENCOUNTER — Telehealth: Payer: Self-pay | Admitting: Family Medicine

## 2022-01-06 MED ORDER — LOSARTAN POTASSIUM 50 MG PO TABS: 50.0000 mg | ORAL_TABLET | Freq: Two times a day (BID) | ORAL | 3 refills | Status: DC

## 2022-01-06 NOTE — Telephone Encounter (Signed)
Received eFax from pharmacy to request new Rx for refill of ? ?losartan (COZAAR) 50 MG tablet [[id number]]  ? ?Efax received from ? ?[facility] [place] [address] ?Phone: [phone] ?Fax: [phone] ? ?Please advise pharmacist.  ?

## 2022-01-06 NOTE — Telephone Encounter (Signed)
RX sent to pharmacy  

## 2022-01-09 ENCOUNTER — Encounter: Payer: Self-pay | Admitting: Family Medicine

## 2022-01-13 ENCOUNTER — Telehealth: Payer: Self-pay | Admitting: Family Medicine

## 2022-01-13 NOTE — Telephone Encounter (Signed)
Received Efax from pharmacy to request refill of ? ?ARIPiprazole (ABILIFY) 5 MG tablet [[id number]]  ? ?Efax received from ? ?[place] - [address] [place], [place] [address]  ?Phone:  [phone]  Fax:  [phone]  ? ? ?New Rx needed. ?Please advise pharmacist.  ?

## 2022-01-13 NOTE — Telephone Encounter (Signed)
Refill sent 10/16/21, #90, 3 refills. Per chart patient is not due for refills at this time.  ? ?Spoke with [facility], they do have refills on file. They are not sure why request was sent. Request has been removed by pharmacy tech. Nothing further needed at this time.  ? ?

## 2022-01-22 ENCOUNTER — Other Ambulatory Visit: Payer: Self-pay | Admitting: Family Medicine

## 2022-01-22 ENCOUNTER — Other Ambulatory Visit: Payer: Medicare HMO

## 2022-01-23 LAB — BASIC METABOLIC PANEL
BUN: 18 mg/dL (ref 7–25)
CO2: 25 mmol/L (ref 20–32)
Calcium: 10.7 mg/dL — ABNORMAL HIGH (ref 8.6–10.4)
Chloride: 107 mmol/L (ref 98–110)
Creat: 1 mg/dL (ref 0.60–1.00)
Glucose, Bld: 92 mg/dL (ref 65–99)
Potassium: 4.9 mmol/L (ref 3.5–5.3)
Sodium: 143 mmol/L (ref 135–146)

## 2022-01-26 ENCOUNTER — Encounter: Payer: Self-pay | Admitting: Family Medicine

## 2022-01-26 ENCOUNTER — Ambulatory Visit (INDEPENDENT_AMBULATORY_CARE_PROVIDER_SITE_OTHER): Payer: Medicare HMO | Admitting: Family Medicine

## 2022-01-26 DIAGNOSIS — I10 Essential (primary) hypertension: Secondary | ICD-10-CM | POA: Diagnosis not present

## 2022-01-26 MED ORDER — AMLODIPINE BESYLATE 10 MG PO TABS: 10.0000 mg | ORAL_TABLET | Freq: Every day | ORAL | 1 refills | Status: DC

## 2022-01-26 NOTE — Progress Notes (Signed)
? ?Subjective:  ? ? Patient ID: Melanie Hurst, female    DOB: Apr 25, 1951, 71 y.o.   MRN: [id number] ?Patient is here today to discuss her elevated calcium level.  Recently her calcium was high as 11.  I asked her to stop all dairy products and calcium and vitamin D supplements.  She rechecked her calcium a month later and it was 10.7.  She has a family history of hyperparathyroidism.  Her mother had her parathyroid glands removed.  She is here today to recheck her calcium level and check PTH.  Of note her blood pressure is extremely high today and her heart rate is high.  She is taking Provigil 200 mg p.o. twice daily.  She is also consistently taking her losartan. ?Past Medical History:  ?Diagnosis Date  ? Anemia   ? Anxiety   ? Depression   ? Fibromyalgia   ? GERD (gastroesophageal reflux disease)   ? Hypertension   ? IBS (irritable bowel syndrome)   ? Obesity   ? Scoliosis   ? Sleep apnea   ? cpap at 7 cm  ? Tremor, essential   ? ?Past Surgical History:  ?Procedure Laterality Date  ? PCOS surgery  1971  ? TONSILECTOMY, ADENOIDECTOMY, BILATERAL MYRINGOTOMY AND TUBES  1962  ? ?Current Outpatient Medications on File Prior to Visit  ?Medication Sig Dispense Refill  ? AMBULATORY NON FORMULARY MEDICATION Medication Name: Fesol complete (Iron) one tablet by mouth once daily    ? ARIPiprazole (ABILIFY) 5 MG tablet Take 1 tablet (5 mg total) by mouth daily. 90 tablet 3  ? losartan (COZAAR) 50 MG tablet Take 1 tablet (50 mg total) by mouth 2 (two) times daily. 180 tablet 3  ? modafinil (PROVIGIL) 200 MG tablet Take 1 tablet (200 mg total) by mouth in the morning and at bedtime. 180 tablet 1  ? ondansetron (ZOFRAN-ODT) 4 MG disintegrating tablet Take 1 tablet every 4-6 hours as needed (Patient not taking: Reported on 12/18/2021) 30 tablet 5  ? pantoprazole (PROTONIX) 40 MG tablet Take 1 tablet (40 mg total) by mouth 2 (two) times daily. 60 tablet 5  ? venlafaxine XR (EFFEXOR-XR) 75 MG 24 hr capsule TAKE 1 CAPSULE EVERY        MORNING AND AT BEDTIME 180 capsule 1  ? ?No current facility-administered medications on file prior to visit.  ? ?Allergies  ?Allergen Reactions  ? Asa [Aspirin] Hives  ? Codeine   ?  Vomit   ? ?Social History  ? ?Socioeconomic History  ? Marital status: Married  ?  Spouse name: Not on file  ? Number of children: 0  ? Years of education: Not on file  ? Highest education level: Not on file  ?Occupational History  ? Occupation: retired  ?Tobacco Use  ? Smoking status: Former  ?  Years: 6.00  ?  Types: Cigarettes  ?  Quit date: 10/11/2009  ?  Years since quitting: 12.3  ? Smokeless tobacco: Never  ?Vaping Use  ? Vaping Use: Former  ? Devices: vaped to stop smoking  ?Substance and Sexual Activity  ? Alcohol use: Yes  ?  Comment: Occasional wine  ? Drug use: No  ? Sexual activity: Not Currently  ?Other Topics Concern  ? Not on file  ?Social History Narrative  ? Not on file  ? ?Social Determinants of Health  ? ?Financial Resource Strain: Not on file  ?Food Insecurity: Not on file  ?Transportation Needs: Not on file  ?Physical Activity: Not on  file  ?Stress: Not on file  ?Social Connections: Not on file  ?Intimate Partner Violence: Not on file  ? ? ? ? ?Review of Systems  ?All other systems reviewed and are negative. ? ?   ?Objective:  ? Physical Exam ?Vitals reviewed.  ?Constitutional:   ?   Appearance: She is well-developed. She is obese. She is not diaphoretic.  ?Cardiovascular:  ?   Rate and Rhythm: Normal rate and regular rhythm.  ?   Heart sounds: Normal heart sounds. No murmur heard. ?Pulmonary:  ?   Effort: Pulmonary effort is normal. No respiratory distress.  ?   Breath sounds: Normal breath sounds. No wheezing.  ?Abdominal:  ?   General: Bowel sounds are normal. There is no distension.  ?   Palpations: Abdomen is soft.  ?   Tenderness: There is no abdominal tenderness. There is no rebound.  ?Musculoskeletal:  ?   Right lower leg: No edema.  ?   Left lower leg: No edema.  ?Neurological:  ?   Mental Status: She is  alert and oriented to person, place, and time.  ?   Cranial Nerves: No cranial nerve deficit.  ?   Motor: No abnormal muscle tone.  ?   Coordination: Coordination normal.  ?   Deep Tendon Reflexes: Reflexes are normal and symmetric.  ? ? ? ? ? ?   ?Assessment & Plan:  ?Hypercalcemia - Plan: PTH, Intact and Calcium, VITAMIN D 25 Hydroxy (Vit-D Deficiency, Fractures) ? ?Primary hypertension ?Discontinue Provigil.  If blood pressure remains elevated, add amlodipine 10 mg a day.  Check PTH, vitamin D, and calcium.  PTH is elevated, would recommend endocrinology consult.  Recheck blood pressure in 1 week ?

## 2022-01-27 LAB — VITAMIN D 25 HYDROXY (VIT D DEFICIENCY, FRACTURES): Vit D, 25-Hydroxy: 10 ng/mL — ABNORMAL LOW (ref 30–100)

## 2022-01-27 LAB — PTH, INTACT AND CALCIUM
Calcium: 10.4 mg/dL (ref 8.6–10.4)
PTH: 123 pg/mL — ABNORMAL HIGH (ref 16–77)

## 2022-01-28 NOTE — Telephone Encounter (Signed)
Rx called per Dr. Dennard Schaumann, pt aware ?

## 2022-02-01 ENCOUNTER — Other Ambulatory Visit (HOSPITAL_COMMUNITY): Payer: Self-pay

## 2022-02-04 ENCOUNTER — Other Ambulatory Visit: Payer: Self-pay

## 2022-02-04 DIAGNOSIS — E0590 Thyrotoxicosis, unspecified without thyrotoxic crisis or storm: Secondary | ICD-10-CM

## 2022-02-17 ENCOUNTER — Other Ambulatory Visit: Payer: Self-pay

## 2022-02-23 ENCOUNTER — Ambulatory Visit: Payer: Medicare HMO | Admitting: Gastroenterology

## 2022-03-11 ENCOUNTER — Encounter: Payer: Self-pay | Admitting: "Endocrinology

## 2022-03-11 ENCOUNTER — Ambulatory Visit: Payer: Medicare HMO | Admitting: "Endocrinology

## 2022-03-11 ENCOUNTER — Other Ambulatory Visit: Payer: Self-pay | Admitting: Family Medicine

## 2022-03-11 VITALS — BP 130/84 | HR 84 | Ht 66.0 in | Wt 262.2 lb

## 2022-03-11 DIAGNOSIS — E212 Other hyperparathyroidism: Secondary | ICD-10-CM | POA: Diagnosis not present

## 2022-03-11 DIAGNOSIS — E213 Hyperparathyroidism, unspecified: Secondary | ICD-10-CM | POA: Insufficient documentation

## 2022-03-11 MED ORDER — VITAMIN D (ERGOCALCIFEROL) 1.25 MG (50000 UNIT) PO CAPS: 50000.0000 [IU] | ORAL_CAPSULE | ORAL | 0 refills | Status: DC

## 2022-03-11 NOTE — Progress Notes (Unsigned)
Endocrinology Consult Note       03/11/2022, 4:07 PM  Melanie Hurst is a 71 y.o.-year-old female, referred by her  [name], MD  , for evaluation for hypercalcemia/hyperparathyroidism.   Past Medical History:  Diagnosis Date   Anemia    Anxiety    Depression    Fibromyalgia    GERD (gastroesophageal reflux disease)    Hypertension    IBS (irritable bowel syndrome)    Obesity    Scoliosis    Sleep apnea    cpap at 7 cm   Tremor, essential     Past Surgical History:  Procedure Laterality Date   COLONOSCOPY  10/2021   ESOPHAGOGASTRODUODENOSCOPY  10/2021   PCOS surgery  1971   TONSILECTOMY, ADENOIDECTOMY, BILATERAL MYRINGOTOMY AND TUBES  1962    Social History   Tobacco Use   Smoking status: Former    Years: 6.00    Types: Cigarettes    Quit date: 10/11/2009    Years since quitting: 12.4   Smokeless tobacco: Never  Vaping Use   Vaping Use: Former   Devices: vaped to stop smoking  Substance Use Topics   Alcohol use: Yes    Comment: Occasional wine   Drug use: No    Family History  Problem Relation Age of Onset   Hypertension Mother    Melanoma Mother        wrist   Other Mother        brain tumor   Stroke Mother    Hyperlipidemia Mother    Cancer Father    Hyperlipidemia Father    Hypertension Father    Colon polyps Father        not cancerous   Kidney failure Father    Breast cancer Sister    Stomach cancer Maternal Uncle    Breast cancer Paternal Aunt    Colon cancer Paternal Aunt    Irritable bowel syndrome Paternal Aunt    Esophageal cancer Neg Hx    Rectal cancer Neg Hx     Outpatient Encounter Medications as of 03/11/2022  Medication Sig   Methylsulfonylmethane (MSM PO) Take 1 tablet by mouth daily.   Vitamin D, Ergocalciferol, (DRISDOL) 1.25 MG (50000 UNIT) CAPS capsule Take 1 capsule (50,000 Units total) by mouth every 7 (seven) days.   AMBULATORY NON FORMULARY MEDICATION  Medication Name: Stevie Kern complete (Iron) one tablet by mouth once daily (Patient not taking: Reported on 03/11/2022)   ARIPiprazole (ABILIFY) 5 MG tablet Take 1 tablet (5 mg total) by mouth daily.   losartan (COZAAR) 50 MG tablet Take 1 tablet (50 mg total) by mouth 2 (two) times daily.   modafinil (PROVIGIL) 200 MG tablet Take 1 tablet (200 mg total) by mouth in the morning and at bedtime.   ondansetron (ZOFRAN-ODT) 4 MG disintegrating tablet Take 1 tablet every 4-6 hours as needed (Patient not taking: Reported on 03/11/2022)   venlafaxine XR (EFFEXOR-XR) 75 MG 24 hr capsule TAKE 1 CAPSULE EVERY       MORNING AND AT BEDTIME (Patient taking differently: Take 2 capsules each morning and 1 capsule at bedtime)   [DISCONTINUED] amLODipine (NORVASC) 10 MG tablet  Take 1 tablet (10 mg total) by mouth daily.   [DISCONTINUED] pantoprazole (PROTONIX) 40 MG tablet Take 1 tablet (40 mg total) by mouth 2 (two) times daily. (Patient not taking: Reported on 01/26/2022)   No facility-administered encounter medications on file as of 03/11/2022.    Allergies  Allergen Reactions   Asa [Aspirin] Hives   Codeine     Vomit      HPI  Melanie Hurst was known to have hypercalcemia at least from 2021.  Review of her medical records show she did have at least 3 separate occasions where her calcium was above normal.   Patient has no previously known history of parathyroid, pituitary, adrenal dysfunctions; no family history of such dysfunctions. -Review of herreferral package of most recent labs reveals calcium of 10.7 the corresponding PTH of 123 on January 22, 2022. Patient does not have any bone density studies.  No prior history of fragility fractures or falls. No history of  kidney stones.  No history of CKD. Last BUN/Cr: 18/1  she is not on HCTZ or other thiazide therapy.  She has profound vitamin D deficiency at 10, not on vitamin D supplement.   she is not on calcium supplements,  she eats dairy and green, leafy,  vegetables on average amounts.  she does not have a family history of hypercalcemia, pituitary tumors, thyroid cancer, or osteoporosis.   I reviewed her chart and she also has a history of hypertension, obesity.    ROS:  Constitutional: + Fluctuating body weight, + no fatigue, no subjective hyperthermia, no subjective hypothermia Eyes: no blurry vision, no xerophthalmia ENT: no sore throat, no nodules palpated in throat, no dysphagia/odynophagia, no hoarseness Cardiovascular: no Chest Pain, no Shortness of Breath, no palpitations, no leg swelling Respiratory: no cough, no shortness of breath  Gastrointestinal: no Nausea/Vomiting/Diarhhea Musculoskeletal: no muscle/joint aches Skin: no rashes Neurological: no tremors, no numbness, no tingling, no dizziness Psychiatric: no depression, no anxiety  PE: BP 130/84   Pulse 84   Ht '5\' 6"'$  (1.676 m)   Wt 262 lb 3.2 oz (118.9 kg)   BMI 42.32 kg/m , Body mass index is 42.32 kg/m. Wt Readings from Last 3 Encounters:  03/11/22 262 lb 3.2 oz (118.9 kg)  01/26/22 272 lb (123.4 kg)  12/18/21 272 lb (123.4 kg)    Constitutional: + BMI of 42.3, not in acute distress, normal state of mind Eyes: PERRLA, EOMI, no exophthalmos ENT: moist mucous membranes, no gross thyromegaly, no gross cervical lymphadenopathy Cardiovascular: normal precordial activity, Regular Rate and Rhythm, no Murmur/Rubs/Gallops Respiratory:  adequate breathing efforts, no gross chest deformity, Clear to auscultation bilaterally Gastrointestinal: abdomen soft, Non -tender, No distension, Bowel Sounds present Musculoskeletal: + Scoliosis, no gross deformities, strength intact in all four extremities Skin: moist, warm, no rashes Neurological: no tremor with outstretched hands, Deep tendon reflexes normal in bilateral lower extremities.     CMP ( most recent) CMP     Component Value Date/Time   NA 143 01/22/2022 1551   NA 141 08/13/2020 1400   K 4.9 01/22/2022 1551   CL  107 01/22/2022 1551   CO2 25 01/22/2022 1551   GLUCOSE 92 01/22/2022 1551   BUN 18 01/22/2022 1551   BUN 14 08/13/2020 1400   CREATININE 1.00 01/22/2022 1551   CALCIUM 10.4 01/26/2022 1552   PROT 6.6 12/18/2021 1553   PROT 6.9 11/07/2017 1419   ALBUMIN 4.6 11/07/2017 1419   AST 11 12/18/2021 1553   ALT 7 12/18/2021 1553  ALKPHOS 122 (H) 11/07/2017 1419   BILITOT 0.4 12/18/2021 1553   BILITOT 0.4 11/07/2017 1419   GFRNONAA 60 11/04/2020 1437   GFRAA 70 11/04/2020 1437      Lipid Panel ( most recent) Lipid Panel     Component Value Date/Time   CHOL 197 11/04/2020 1437   TRIG 169 (H) 11/04/2020 1437   HDL 43 (L) 11/04/2020 1437   CHOLHDL 4.6 11/04/2020 1437   VLDL 48 (H) 08/02/2016 1455   LDLCALC 125 (H) 11/04/2020 1437      Lab Results  Component Value Date   TSH 1.96 04/19/2017   TSH 2.42 06/11/2016      Assessment: 1. Hypercalcemia / Hyperparathyroidism  Plan: Patient has had several instances of elevated calcium, with the highest level being at 11 mg/dL.  Most recently, her calcium was 10.7 with A corresponding intact PTH level was also high, at 123.  - Patient also  has vitamin D deficiency, she will be initiated with prescription strength vitamin D-ergocalciferol 50,000 units weekly.    - No apparent complications from hypercalcemia/hyperparathyroidism: no history of  nephrolithiasis,  osteoporosis,fragility fractures. No abdominal pain, no major mood disorders, no bone pain.  - I discussed with the patient about the physiology of calcium and parathyroid hormone, and possible  effects of  increased PTH/ Calcium , including kidney stones, cardiac dysrhythmias, osteoporosis, abdominal pain, etc.   - The work up so far is not sufficient to reach a conclusion for definitive therapy.  After correction of her profound vitamin D deficiency, she  needs more studies to confirm and classify the parathyroid dysfunction she may have. She will have  repeat intact  PTH/calcium, serum magnesium, serum phosphorus, PTH R.  If she returns with labs indicative of hyperparathyroidism, it is also essential to obtain 24-hour urine calcium/creatinine to rule out the rare but important cause of mild elevation in calcium and PTH- [place] ( Familial Hypocalciuric Hypercalcemia), which may not require any active intervention.  - I will request for her next DEXA scan to include the distal  33% of  radius for evaluation of cortical bone, which is predominantly affected by hyperparathyroidism.   She is advised to maintain close follow-up with her PMD.  - Time spent with the patient: 50 minutes, of which >50% was spent in obtaining information about her symptoms, reviewing her previous labs, evaluations, and treatments, counseling her about her hypercalcemia/hyperparathyroidism, and developing a plan to confirm the diagnosis and long term treatment as necessary.  Please refer to " Patient Self Inventory" in the Media  tab for reviewed elements of pertinent patient history.  Laureen Ochs participated in the discussions, expressed understanding, and voiced agreement with the above plans.  All questions were answered to her satisfaction. she is encouraged to contact clinic should she have any questions or concerns prior to her return visit.  - Return in about 3 months (around 06/11/2022) for F/U with Pre-visit Labs, DXA Scan B4 NV.   Glade Lloyd, MD [facility] [address] [place], [place] [address] Phone: [phone]  Fax: [phone]    This note was partially dictated with voice recognition software. Similar sounding words can be transcribed inadequately or may not  be corrected upon review.  03/11/2022, 4:07 PM

## 2022-03-11 NOTE — Telephone Encounter (Signed)
Requested Prescriptions  Pending Prescriptions Disp Refills  . amLODipine (NORVASC) 10 MG tablet [Pharmacy Med Name: AMLODIPINE BESYLATE 10 MG TAB] 30 tablet 1    Sig: TAKE 1 TABLET BY MOUTH EVERY DAY     Cardiovascular: Calcium Channel Blockers 2 Failed - 03/11/2022  2:21 AM      Failed - Last BP in normal range    BP Readings from Last 1 Encounters:  01/26/22 (!) 162/92         Passed - Last Heart Rate in normal range    Pulse Readings from Last 1 Encounters:  01/26/22 (!) 105         Passed - Valid encounter within last 6 months    Recent Outpatient Visits          1 month ago Hypercalcemia   [place] Dennard Schaumann, Cammie Mcgee, MD   2 months ago Primary hypertension   [place] Dennard Schaumann, Cammie Mcgee, MD   7 months ago [place] Pickard, Cammie Mcgee, MD   7 months ago Nausea   [place] Denn[place] Schaumann, Cammie Mcgee, MD   1 year ago Dyspnea, unspecified type   [place] Pickard, Cammie Mcgee, MD      Future Appointments            Tomorrow Pickard, Cammie Mcgee, MD [place], [place]

## 2022-03-12 ENCOUNTER — Ambulatory Visit (INDEPENDENT_AMBULATORY_CARE_PROVIDER_SITE_OTHER): Payer: Medicare HMO | Admitting: Family Medicine

## 2022-03-12 VITALS — BP 148/78 | HR 77 | Temp 99.3°F | Ht 66.0 in | Wt 162.0 lb

## 2022-03-12 DIAGNOSIS — R195 Other fecal abnormalities: Secondary | ICD-10-CM | POA: Diagnosis not present

## 2022-03-12 DIAGNOSIS — Z1159 Encounter for screening for other viral diseases: Secondary | ICD-10-CM

## 2022-03-12 MED ORDER — NYSTATIN 100000 UNIT/ML MT SUSP: 5.0000 mL | Freq: Four times a day (QID) | OROMUCOSAL | 0 refills | Status: DC

## 2022-03-12 NOTE — Progress Notes (Signed)
Subjective:    Patient ID: Melanie Hurst, female    DOB: April 05, 1951, 71 y.o.   MRN: [id number]  Patient is a very pleasant 71 year old Caucasian female who presents today complaining of clay colored stool over the last few weeks.  She denies any abdominal pain.  She denies any nausea or vomiting.  She denies any jaundice.  She denies any pruritus.  She denies any fevers or chills.  She denies any severe diarrhea or weight loss. Past Medical History:  Diagnosis Date   Anemia    Anxiety    Depression    Fibromyalgia    GERD (gastroesophageal reflux disease)    Hypertension    IBS (irritable bowel syndrome)    Obesity    Scoliosis    Sleep apnea    cpap at 7 cm   Tremor, essential    Past Surgical History:  Procedure Laterality Date   COLONOSCOPY  10/2021   ESOPHAGOGASTRODUODENOSCOPY  10/2021   PCOS surgery  1971   TONSILECTOMY, ADENOIDECTOMY, BILATERAL MYRINGOTOMY AND TUBES  1962   Current Outpatient Medications on File Prior to Visit  Medication Sig Dispense Refill   AMBULATORY NON FORMULARY MEDICATION Medication Name: Fesol complete (Iron) one tablet by mouth once daily     amLODipine (NORVASC) 10 MG tablet TAKE 1 TABLET BY MOUTH EVERY DAY 30 tablet 0   ARIPiprazole (ABILIFY) 5 MG tablet Take 1 tablet (5 mg total) by mouth daily. 90 tablet 3   losartan (COZAAR) 50 MG tablet Take 1 tablet (50 mg total) by mouth 2 (two) times daily. 180 tablet 3   Methylsulfonylmethane (MSM PO) Take 1 tablet by mouth daily.     modafinil (PROVIGIL) 200 MG tablet Take 1 tablet (200 mg total) by mouth in the morning and at bedtime. 180 tablet 1   ondansetron (ZOFRAN-ODT) 4 MG disintegrating tablet Take 1 tablet every 4-6 hours as needed 30 tablet 5   venlafaxine XR (EFFEXOR-XR) 75 MG 24 hr capsule TAKE 1 CAPSULE EVERY       MORNING AND AT BEDTIME (Patient taking differently: Take 2 capsules each morning and 1 capsule at bedtime) 180 capsule 1   Vitamin D, Ergocalciferol, (DRISDOL) 1.25 MG (50000  UNIT) CAPS capsule Take 1 capsule (50,000 Units total) by mouth every 7 (seven) days. 12 capsule 0   No current facility-administered medications on file prior to visit.   Allergies  Allergen Reactions   Asa [Aspirin] Hives   Codeine     Vomit    Social History   Socioeconomic History   Marital status: Married    Spouse name: Not on file   Number of children: 0   Years of education: Not on file   Highest education level: Not on file  Occupational History   Occupation: retired  Tobacco Use   Smoking status: Former    Years: 6.00    Types: Cigarettes    Quit date: 10/11/2009    Years since quitting: 12.4   Smokeless tobacco: Never  Vaping Use   Vaping Use: Former   Devices: vaped to stop smoking  Substance and Sexual Activity   Alcohol use: Yes    Comment: Occasional wine   Drug use: No   Sexual activity: Not Currently  Other Topics Concern   Not on file  Social History Narrative   Not on file   Social Determinants of Health   Financial Resource Strain: Not on file  Food Insecurity: Not on file  Transportation Needs: Not on file  Physical Activity: Not on file  Stress: Not on file  Social Connections: Not on file  Intimate Partner Violence: Not on file      Review of Systems  All other systems reviewed and are negative.     Objective:   Physical Exam Vitals reviewed.  Constitutional:      Appearance: She is well-developed. She is obese. She is not diaphoretic.  Cardiovascular:     Rate and Rhythm: Normal rate and regular rhythm.     Heart sounds: Normal heart sounds. No murmur heard. Pulmonary:     Effort: Pulmonary effort is normal. No respiratory distress.     Breath sounds: Normal breath sounds. No wheezing.  Abdominal:     General: Bowel sounds are normal. There is no distension.     Palpations: Abdomen is soft.     Tenderness: There is no abdominal tenderness.  Musculoskeletal:     Right lower leg: No edema.     Left lower leg: No edema.   Neurological:     Mental Status: She is alert.     Motor: No abnormal muscle tone.     Deep Tendon Reflexes: Reflexes are normal and symmetric.          Assessment & Plan:  Change in stool - Plan: CBC with Differential/Platelet, COMPLETE METABOLIC PANEL WITH GFR, Lipase  Encounter for hepatitis C screening test for low risk patient - Plan: Hepatitis C antibody I believe this is likely due to dietary changes.  I will check a CBC, CMP and a lipase.  If lipase is elevated, consider a CT scan of the abdomen pelvis.  If bilirubin is elevated, evaluate for any obstruction in the biliary tract.  Barring this I believe is most likely due to diet.  Screen for hepatitis C

## 2022-03-15 LAB — CBC WITH DIFFERENTIAL/PLATELET
Absolute Monocytes: 490 cells/uL (ref 200–950)
Basophils Absolute: 79 cells/uL (ref 0–200)
Basophils Relative: 1 %
Eosinophils Absolute: 142 cells/uL (ref 15–500)
Eosinophils Relative: 1.8 %
HCT: 40.6 % (ref 35.0–45.0)
Hemoglobin: 12.9 g/dL (ref 11.7–15.5)
Lymphs Abs: 2433 cells/uL (ref 850–3900)
MCH: 26.7 pg — ABNORMAL LOW (ref 27.0–33.0)
MCHC: 31.8 g/dL — ABNORMAL LOW (ref 32.0–36.0)
MCV: 83.9 fL (ref 80.0–100.0)
MPV: 9.7 fL (ref 7.5–12.5)
Monocytes Relative: 6.2 %
Neutro Abs: 4756 cells/uL (ref 1500–7800)
Neutrophils Relative %: 60.2 %
Platelets: 409 10*3/uL — ABNORMAL HIGH (ref 140–400)
RBC: 4.84 10*6/uL (ref 3.80–5.10)
RDW: 14.6 % (ref 11.0–15.0)
Total Lymphocyte: 30.8 %
WBC: 7.9 10*3/uL (ref 3.8–10.8)

## 2022-03-15 LAB — COMPLETE METABOLIC PANEL WITH GFR
AG Ratio: 1.8 (calc) (ref 1.0–2.5)
ALT: 9 U/L (ref 6–29)
AST: 12 U/L (ref 10–35)
Albumin: 4.2 g/dL (ref 3.6–5.1)
Alkaline phosphatase (APISO): 137 U/L (ref 37–153)
BUN: 13 mg/dL (ref 7–25)
CO2: 23 mmol/L (ref 20–32)
Calcium: 11 mg/dL — ABNORMAL HIGH (ref 8.6–10.4)
Chloride: 104 mmol/L (ref 98–110)
Creat: 0.84 mg/dL (ref 0.60–1.00)
Globulin: 2.4 g/dL (calc) (ref 1.9–3.7)
Glucose, Bld: 96 mg/dL (ref 65–99)
Potassium: 4.6 mmol/L (ref 3.5–5.3)
Sodium: 141 mmol/L (ref 135–146)
Total Bilirubin: 0.4 mg/dL (ref 0.2–1.2)
Total Protein: 6.6 g/dL (ref 6.1–8.1)
eGFR: 75 mL/min/{1.73_m2} (ref >=60)

## 2022-03-15 LAB — HEPATITIS C ANTIBODY
Hepatitis C Ab: NONREACTIVE
SIGNAL TO CUT-OFF: 0.03 (ref <1.00)

## 2022-03-15 LAB — LIPASE: Lipase: 22 U/L (ref 7–60)

## 2022-03-16 ENCOUNTER — Telehealth: Payer: Self-pay | Admitting: "Endocrinology

## 2022-03-16 MED ORDER — VITAMIN D (ERGOCALCIFEROL) 1.25 MG (50000 UNIT) PO CAPS: 50000.0000 [IU] | ORAL_CAPSULE | ORAL | 0 refills | Status: DC

## 2022-03-16 NOTE — Telephone Encounter (Signed)
[facility] to [facility] [address].

## 2022-03-16 NOTE — Telephone Encounter (Signed)
Patient left a VM stating she needs her Vitamin D sent to a local pharmacy. She said she needs it to go to a local pharmacy. I left her a VM to call back to let us know where to send it

## 2022-03-16 NOTE — Telephone Encounter (Signed)
Rx sent 

## 2022-04-07 ENCOUNTER — Other Ambulatory Visit: Payer: Self-pay | Admitting: Family Medicine

## 2022-04-07 NOTE — Telephone Encounter (Signed)
Requested Prescriptions  Pending Prescriptions Disp Refills  . amLODipine (NORVASC) 10 MG tablet [Pharmacy Med Name: AMLODIPINE BESYLATE 10 MG TAB] 90 tablet 1    Sig: TAKE 1 TABLET BY MOUTH EVERY DAY     Cardiovascular: Calcium Channel Blockers 2 Failed - 04/07/2022  2:01 AM      Failed - Last BP in normal range    BP Readings from Last 1 Encounters:  03/12/22 (!) 148/78         Passed - Last Heart Rate in normal range    Pulse Readings from Last 1 Encounters:  03/12/22 77         Passed - Valid encounter within last 6 months    Recent Outpatient Visits          3 weeks ago Change in stool   [place], Cammie Mcgee, MD   2 months ago Hypercalcemia   [place] [facility] Cammie Mcgee, MD   3 months ago Primary hypertension   [place] [facility], Cammie Mcgee, MD   7 months ago [place], Cammie Mcgee, MD   8 months ago Nausea   [place], Cammie Mcgee, MD

## 2022-05-19 ENCOUNTER — Telehealth: Payer: Self-pay | Admitting: "Endocrinology

## 2022-05-19 NOTE — Telephone Encounter (Signed)
Orders reprinted for pt.

## 2022-05-19 NOTE — Telephone Encounter (Signed)
New message   The patient misplaced lab order & bone scan, asking  can the office reprinted the order will come by tomorrow to pick it up .

## 2022-05-24 ENCOUNTER — Ambulatory Visit: Payer: Medicare HMO | Admitting: Family Medicine

## 2022-05-25 ENCOUNTER — Ambulatory Visit (HOSPITAL_COMMUNITY)
Admission: RE | Admit: 2022-05-25 | Discharge: 2022-05-25 | Disposition: A | Discharge status: Home / Self Care | Payer: Medicare HMO | Source: Ambulatory Visit | Attending: "Endocrinology | Admitting: "Endocrinology

## 2022-05-25 DIAGNOSIS — Z780 Asymptomatic menopausal state: Secondary | ICD-10-CM | POA: Diagnosis not present

## 2022-05-25 DIAGNOSIS — E212 Other hyperparathyroidism: Secondary | ICD-10-CM | POA: Insufficient documentation

## 2022-05-25 DIAGNOSIS — Z13820 Encounter for screening for osteoporosis: Secondary | ICD-10-CM | POA: Diagnosis not present

## 2022-05-25 DIAGNOSIS — E559 Vitamin D deficiency, unspecified: Secondary | ICD-10-CM | POA: Diagnosis not present

## 2022-05-27 ENCOUNTER — Telehealth: Payer: Self-pay

## 2022-05-27 ENCOUNTER — Other Ambulatory Visit: Payer: Self-pay | Admitting: Family Medicine

## 2022-05-27 MED ORDER — NIRMATRELVIR/RITONAVIR (PAXLOVID)TABLET
3.0000 | ORAL_TABLET | Freq: Two times a day (BID) | ORAL | 0 refills | Status: AC
Start: 2022-05-27 — End: 2022-06-01

## 2022-05-27 NOTE — Telephone Encounter (Signed)
Pt called in stating she tested positive for Covid. Pt is wanting to know if pcp will send an [facility] [address]. Please advise.   Cb#: [phone]

## 2022-05-28 ENCOUNTER — Other Ambulatory Visit: Payer: Self-pay

## 2022-05-28 MED ORDER — ARIPIPRAZOLE 5 MG PO TABS: 5.0000 mg | ORAL_TABLET | Freq: Every day | ORAL | 0 refills | Status: DC

## 2022-06-06 ENCOUNTER — Encounter: Payer: Self-pay | Admitting: Family Medicine

## 2022-06-07 ENCOUNTER — Other Ambulatory Visit: Payer: Self-pay | Admitting: Family Medicine

## 2022-06-07 MED ORDER — NIRMATRELVIR/RITONAVIR (PAXLOVID)TABLET
3.0000 | ORAL_TABLET | Freq: Two times a day (BID) | ORAL | 0 refills | Status: AC
Start: 2022-06-07 — End: 2022-06-12

## 2022-06-07 NOTE — Telephone Encounter (Signed)
Patient called to follow up on message sent through MyChart to request another Rx of paxlovid.   Pharmacy confirmed as   [id number] [place], [place] [id number] Phone:  [phone] Fax:  [phone] DEA #:  [id number] Please advise at [phone]

## 2022-06-11 DIAGNOSIS — E212 Other hyperparathyroidism: Secondary | ICD-10-CM | POA: Diagnosis not present

## 2022-06-16 ENCOUNTER — Ambulatory Visit: Payer: Medicare HMO | Admitting: "Endocrinology

## 2022-06-17 ENCOUNTER — Ambulatory Visit (INDEPENDENT_AMBULATORY_CARE_PROVIDER_SITE_OTHER): Payer: Medicare HMO | Admitting: Family Medicine

## 2022-06-17 ENCOUNTER — Encounter: Payer: Self-pay | Admitting: Family Medicine

## 2022-06-17 VITALS — BP 130/80 | HR 91 | Temp 98.5°F | Ht 70.0 in | Wt 253.4 lb

## 2022-06-17 DIAGNOSIS — E7800 Pure hypercholesterolemia, unspecified: Secondary | ICD-10-CM

## 2022-06-17 DIAGNOSIS — Z0000 Encounter for general adult medical examination without abnormal findings: Secondary | ICD-10-CM

## 2022-06-17 DIAGNOSIS — M419 Scoliosis, unspecified: Secondary | ICD-10-CM

## 2022-06-17 DIAGNOSIS — R000 Tachycardia, unspecified: Secondary | ICD-10-CM | POA: Diagnosis not present

## 2022-06-17 DIAGNOSIS — Z1231 Encounter for screening mammogram for malignant neoplasm of breast: Secondary | ICD-10-CM | POA: Diagnosis not present

## 2022-06-17 NOTE — Progress Notes (Signed)
Subjective:    Patient ID: Melanie Hurst, female    DOB: [birth year], 71 y.o.   MRN: [id number]   Patient is a very pleasant 71 year old Caucasian female who is here today for complete physical exam.  She believes she had Prevnar 20 at an outside clinic.  She is due for a flu shot as well as a COVID booster.  She recently had RSV vaccine.  She is overdue for mammogram.  Her most recent colonoscopy was performed earlier this year and showed 2, tubular adenomas.  She is not due for a Pap smear due to age.  She had a bone density test earlier this year which was normal.  She is currently seeing endocrinology for primary hyperparathyroidism.  At the present time they are monitoring the situation.  She has an appointment to see the endocrinologist later this month.  Patient has 3 concerns.  First she believes that she may have POTS.  She reports a history of tachycardia.  She states that her blood pressure drops but this is accompanied by flushing, nausea, and lightheadedness.  I have no history or documentation of hypotensive episodes.  She would like a referral for tilt table testing.  I believe first we need to document hypotensive episodes so I would like to get a 24-hour blood pressure monitor to see if in fact her blood pressure is dropping.  Second concern is that she may have Ehlers-Danlos syndrome.  I do not see any hyperflexibility in her joints.  She does have severe thoracolumbar scoliosis which causes severe low back pain.  I do believe that physical therapy is certainly warranted for this.  I believe some of her shoulder laxity and hip laxity that she reports is likely due to muscle atrophy and I believe that physical therapy can help with that.  Third concern is possible thrush.  Today on examination I do not see thrush in her mouth.  Her tongue appears slightly gray but within normal limits.  There is not a thick white yeastlike coating Immunization History  Administered Date(s) Administered    Fluad Quad(high Dose 65+) 06/05/2019, 07/10/2020   Influenza Whole 06/11/2010   Influenza, High Dose Seasonal PF 08/08/2017, 07/27/2018   Influenza,inj,Quad PF,6+ Mos 06/11/2016   PFIZER(Purple Top)SARS-COV-2 Vaccination 11/17/2019, 12/12/2019   Tdap 09/01/2020   Zoster Recombinat (Shingrix) 02/07/2018, 04/10/2018    Past Medical History:  Diagnosis Date   Anemia    Anxiety    Chronic fatigue syndrome with fibromyalgia    Depression    Fibromyalgia    GERD (gastroesophageal reflux disease)    Hypertension    IBS (irritable bowel syndrome)    Obesity    Scoliosis    Sleep apnea    cpap at 7 cm   Tremor, essential     Past Surgical History:  Procedure Laterality Date   COLONOSCOPY  10/2021   ESOPHAGOGASTRODUODENOSCOPY  10/2021   PCOS surgery  1971   TONSILECTOMY, ADENOIDECTOMY, BILATERAL MYRINGOTOMY AND TUBES  1962   Current Outpatient Medications on File Prior to Visit  Medication Sig Dispense Refill   AMBULATORY NON FORMULARY MEDICATION Medication Name: Fesol complete (Iron) one tablet by mouth once daily     amLODipine (NORVASC) 10 MG tablet TAKE 1 TABLET BY MOUTH EVERY DAY 90 tablet 1   ARIPiprazole (ABILIFY) 5 MG tablet Take 1 tablet (5 mg total) by mouth daily. 30 tablet 0   losartan (COZAAR) 50 MG tablet Take 1 tablet (50 mg total) by mouth 2 (two) times  daily. 180 tablet 3   Methylsulfonylmethane (MSM PO) Take 1 tablet by mouth daily.     modafinil (PROVIGIL) 200 MG tablet Take 1 tablet (200 mg total) by mouth in the morning and at bedtime. 180 tablet 1   nystatin (MYCOSTATIN) 100000 UNIT/ML suspension Take 5 mLs (500,000 Units total) by mouth 4 (four) times daily. 60 mL 0   ondansetron (ZOFRAN-ODT) 4 MG disintegrating tablet Take 1 tablet every 4-6 hours as needed 30 tablet 5   venlafaxine XR (EFFEXOR-XR) 75 MG 24 hr capsule TAKE 1 CAPSULE EVERY       MORNING AND AT BEDTIME (Patient taking differently: Take 2 capsules each morning and 1 capsule at bedtime) 180  capsule 1   Vitamin D, Ergocalciferol, (DRISDOL) 1.25 MG (50000 UNIT) CAPS capsule Take 1 capsule (50,000 Units total) by mouth every 7 (seven) days. 12 capsule 0   No current facility-administered medications on file prior to visit.   Allergies  Allergen Reactions   Asa [Aspirin] Hives   Codeine     Vomit    Social History   Socioeconomic History   Marital status: Married    Spouse name: Not on file   Number of children: 0   Years of education: Not on file   Highest education level: Not on file  Occupational History   Occupation: retired  Tobacco Use   Smoking status: Former    Years: 6.00    Types: Cigarettes    Quit date: 10/11/2009    Years since quitting: 12.6   Smokeless tobacco: Never  Vaping Use   Vaping Use: Former   Devices: vaped to stop smoking  Substance and Sexual Activity   Alcohol use: Yes    Comment: Occasional wine   Drug use: No   Sexual activity: Not Currently  Other Topics Concern   Not on file  Social History Narrative   Not on file   Social Determinants of Health   Financial Resource Strain: Not on file  Food Insecurity: Not on file  Transportation Needs: Not on file  Physical Activity: Not on file  Stress: Not on file  Social Connections: Not on file  Intimate Partner Violence: Not on file      Review of Systems  All other systems reviewed and are negative.      Objective:   Physical Exam Vitals reviewed.  Constitutional:      General: She is not in acute distress.    Appearance: She is well-developed. She is obese. She is not ill-appearing, toxic-appearing or diaphoretic.  HENT:     Head: Normocephalic and atraumatic.     Right Ear: Tympanic membrane and ear canal normal.     Left Ear: Tympanic membrane and ear canal normal.     Nose: Nose normal. No congestion or rhinorrhea.     Mouth/Throat:     Mouth: Mucous membranes are moist.     Pharynx: Oropharynx is clear. No oropharyngeal exudate.  Eyes:     Extraocular  Movements: Extraocular movements intact.     Conjunctiva/sclera: Conjunctivae normal.     Pupils: Pupils are equal, round, and reactive to light.  Neck:     Vascular: No carotid bruit.  Cardiovascular:     Rate and Rhythm: Normal rate and regular rhythm.     Pulses: Normal pulses.     Heart sounds: Normal heart sounds. No murmur heard.    No friction rub. No gallop.  Pulmonary:     Effort: Pulmonary effort is normal.  No respiratory distress.     Breath sounds: Normal breath sounds. No stridor. No wheezing or rhonchi.  Chest:     Chest wall: No tenderness.  Abdominal:     General: Bowel sounds are normal. There is no distension.     Palpations: Abdomen is soft. There is no mass.     Tenderness: There is no abdominal tenderness. There is no rebound.  Musculoskeletal:        General: Tenderness and deformity present.     Cervical back: Normal range of motion and neck supple. No rigidity.     Thoracic back: Decreased range of motion. Scoliosis present.     Lumbar back: Spasms and tenderness present. Decreased range of motion. Scoliosis present.     Right lower leg: No edema.     Left lower leg: No edema.  Lymphadenopathy:     Cervical: No cervical adenopathy.  Skin:    General: Skin is warm.     Coloration: Skin is not jaundiced or pale.     Findings: No bruising, erythema, lesion or rash.  Neurological:     Mental Status: She is alert and oriented to person, place, and time.     Cranial Nerves: No cranial nerve deficit.     Motor: Weakness present. No abnormal muscle tone.     Coordination: Coordination normal.     Gait: Gait abnormal.     Deep Tendon Reflexes: Reflexes are normal and symmetric.  Psychiatric:        Mood and Affect: Mood normal.        Behavior: Behavior normal.        Thought Content: Thought content normal.        Judgment: Judgment normal.           Assessment & Plan:  Encounter for screening mammogram for malignant neoplasm of breast - Plan: MM  Digital Screening  Pure hypercholesterolemia - Plan: Lipid panel  Scoliosis of lumbar spine, unspecified scoliosis type - Plan: Ambulatory referral to Physical Therapy  Tachycardia - Plan: 24 hour blood pressure monitor Patient has certainly had episodes of tachycardia in the past.  I am concerned because she is on Provigil for chronic fatigue syndrome and depression.  Therefore I would like to get a 24-hour blood pressure monitor to document whether she has episodes of hypotension.  If she does have significant hypertensive drops in her blood pressure, she may benefit from tilt table testing.  Today her blood pressure is normal at 130/80.  Second she has severe thoracolumbar scoliosis.  I believe this causes severe back pain and her back pain likely contributes to muscle atrophy in her shoulders and her hips due to her lack of mobility.  I believe this is more likely than Ehlers-Danlos syndrome.  Therefore I agree that I think physical therapy would be very beneficial for the patient and I am happy to order that.  I would also like to schedule her for mammogram to complete her breast cancer screening.  She had a CBC and a CMP in June that were relatively normal except for her primary hyperparathyroidism.  I would like to check her cholesterol.  Patient has had her pneumonia shot.  Recommended a flu shot as well as a COVID booster.  The remainder of her preventative care is up-to-date.

## 2022-06-18 DIAGNOSIS — H5213 Myopia, bilateral: Secondary | ICD-10-CM | POA: Diagnosis not present

## 2022-06-18 DIAGNOSIS — Z0100 Encounter for examination of eyes and vision without abnormal findings: Secondary | ICD-10-CM | POA: Diagnosis not present

## 2022-06-18 LAB — PTH, INTACT AND CALCIUM
Calcium: 10.6 mg/dL — ABNORMAL HIGH (ref 8.7–10.3)
PTH: 86 pg/mL — ABNORMAL HIGH (ref 15–65)

## 2022-06-18 LAB — LIPID PANEL
Cholesterol: 229 mg/dL — ABNORMAL HIGH (ref <200)
HDL: 58 mg/dL (ref >=50)
LDL Cholesterol (Calc): 146 mg/dL (calc) — ABNORMAL HIGH
Non-HDL Cholesterol (Calc): 171 mg/dL (calc) — ABNORMAL HIGH (ref <130)
Total CHOL/HDL Ratio: 3.9 (calc) (ref <5.0)
Triglycerides: 129 mg/dL (ref <150)

## 2022-06-18 LAB — MAGNESIUM: Magnesium: 2.3 mg/dL (ref 1.6–2.3)

## 2022-06-18 LAB — PHOSPHORUS: Phosphorus: 3.3 mg/dL (ref 3.0–4.3)

## 2022-06-18 LAB — PTH-RELATED PEPTIDE: PTH-related peptide: <2 pmol/L

## 2022-06-18 LAB — T4, FREE: Free T4: 1 ng/dL (ref 0.82–1.77)

## 2022-06-18 LAB — VITAMIN D 25 HYDROXY (VIT D DEFICIENCY, FRACTURES): Vit D, 25-Hydroxy: 31.3 ng/mL (ref 30.0–100.0)

## 2022-06-18 LAB — TSH: TSH: 2.36 u[IU]/mL (ref 0.450–4.500)

## 2022-06-21 ENCOUNTER — Encounter: Payer: Self-pay | Admitting: "Endocrinology

## 2022-06-21 ENCOUNTER — Ambulatory Visit (INDEPENDENT_AMBULATORY_CARE_PROVIDER_SITE_OTHER): Payer: Medicare HMO | Admitting: "Endocrinology

## 2022-06-21 ENCOUNTER — Other Ambulatory Visit: Payer: Self-pay

## 2022-06-21 VITALS — BP 138/82 | HR 72 | Ht 70.0 in | Wt 254.8 lb

## 2022-06-21 DIAGNOSIS — E7800 Pure hypercholesterolemia, unspecified: Secondary | ICD-10-CM

## 2022-06-21 DIAGNOSIS — E782 Mixed hyperlipidemia: Secondary | ICD-10-CM

## 2022-06-21 DIAGNOSIS — E212 Other hyperparathyroidism: Secondary | ICD-10-CM | POA: Diagnosis not present

## 2022-06-21 MED ORDER — ROSUVASTATIN CALCIUM 10 MG PO TABS: 10.0000 mg | ORAL_TABLET | Freq: Every day | ORAL | 3 refills | Status: DC

## 2022-06-21 NOTE — Patient Instructions (Signed)

## 2022-06-21 NOTE — Progress Notes (Signed)
06/21/2022, 4:21 PM  Endocrinology follow-up note  Melanie Hurst is a 71 y.o.-year-old female, referred by her  [name], MD  , for evaluation for hypercalcemia/hyperparathyroidism.   Past Medical History:  Diagnosis Date   Anemia    Anxiety    Chronic fatigue syndrome with fibromyalgia    Depression    Fibromyalgia    GERD (gastroesophageal reflux disease)    Hypertension    IBS (irritable bowel syndrome)    Obesity    Scoliosis    Sleep apnea    cpap at 7 cm   Tremor, essential     Past Surgical History:  Procedure Laterality Date   COLONOSCOPY  10/2021   ESOPHAGOGASTRODUODENOSCOPY  10/2021   PCOS surgery  1971   TONSILECTOMY, ADENOIDECTOMY, BILATERAL MYRINGOTOMY AND TUBES  1962    Social History   Tobacco Use   Smoking status: Former    Years: 6.00    Types: Cigarettes    Quit date: 10/11/2009    Years since quitting: 12.7   Smokeless tobacco: Never  Vaping Use   Vaping Use: Former   Devices: vaped to stop smoking  Substance Use Topics   Alcohol use: Yes    Comment: Occasional wine   Drug use: No    Family History  Problem Relation Age of Onset   Hypertension Mother    Melanoma Mother        wrist   Other Mother        brain tumor   Stroke Mother    Hyperlipidemia Mother    Cancer Father    Hyperlipidemia Father    Hypertension Father    Colon polyps Father        not cancerous   Kidney failure Father    Breast cancer Sister    Stomach cancer Maternal Uncle    Breast cancer Paternal Aunt    Colon cancer Paternal Aunt    Irritable bowel syndrome Paternal Aunt    Esophageal cancer Neg Hx    Rectal cancer Neg Hx     Outpatient Encounter Medications as of 06/21/2022  Medication Sig   B Complex Vitamins (VITAMIN B COMPLEX PO) Take 1 tablet by mouth daily.   Multiple Vitamin (MULTIVITAMIN ADULT PO) Take 1 tablet by mouth daily.   Multiple Vitamins-Minerals (PRESERVISION AREDS  2 PO) Take by mouth daily.   VITAMIN E PO Take 1 tablet by mouth daily.   VITAMIN K PO Take 1 tablet by mouth daily.   ZINC OXIDE PO Take 1 tablet by mouth daily.   AMBULATORY NON FORMULARY MEDICATION Medication Name: Stevie Kern complete (Iron) one tablet by mouth once daily (Patient not taking: Reported on 06/21/2022)   amLODipine (NORVASC) 10 MG tablet TAKE 1 TABLET BY MOUTH EVERY DAY   ARIPiprazole (ABILIFY) 5 MG tablet Take 1 tablet (5 mg total) by mouth daily.   losartan (COZAAR) 50 MG tablet Take 1 tablet (50 mg total) by mouth 2 (two) times daily.   Methylsulfonylmethane (MSM PO) Take 1 tablet by mouth daily.   modafinil (PROVIGIL) 200 MG tablet Take 1 tablet (200 mg total) by mouth in the morning and at  bedtime.   nystatin (MYCOSTATIN) 100000 UNIT/ML suspension Take 5 mLs (500,000 Units total) by mouth 4 (four) times daily. (Patient not taking: Reported on 06/21/2022)   ondansetron (ZOFRAN-ODT) 4 MG disintegrating tablet Take 1 tablet every 4-6 hours as needed (Patient not taking: Reported on 06/21/2022)   rosuvastatin (CRESTOR) 10 MG tablet Take 1 tablet (10 mg total) by mouth daily.   venlafaxine XR (EFFEXOR-XR) 75 MG 24 hr capsule TAKE 1 CAPSULE EVERY       MORNING AND AT BEDTIME (Patient taking differently: Take 2 capsules each morning and 1 capsule at bedtime)   Vitamin D, Ergocalciferol, (DRISDOL) 1.25 MG (50000 UNIT) CAPS capsule Take 1 capsule (50,000 Units total) by mouth every 7 (seven) days.   No facility-administered encounter medications on file as of 06/21/2022.    Allergies  Allergen Reactions   Asa [Aspirin] Hives   Codeine     Vomit      HPI  [name] was known to have hypercalcemia at least from 2021.  Review of her medical records show she did have at least 3 separate occasions where her calcium was above normal.   Patient has was seen with indicating primary hyperparathyroidism.  She was sent for more work-up including repeat PTH/calcium and bone density.   She  returns with no new symptoms or complaints.  Her repeat labs show calcium slightly better at 10.6 and PTH dropping to 86 from 123.  Her bone density is negative for osteopenia/osteoporosis.  No prior history of fragility fractures or falls. No history of  kidney stones.  No history of CKD. Last BUN/Cr: 18/1  she is not on HCTZ or other thiazide therapy.  She has profound vitamin D deficiency at 61, not on vitamin D supplement.   she is not on calcium supplements,  she eats dairy and green, leafy, vegetables on average amounts.  she does not have a family history of hypercalcemia, pituitary tumors, thyroid cancer, or osteoporosis.   I reviewed her chart and she also has a history of hypertension, obesity.    ROS:  Constitutional: + Fluctuating body weight, + no fatigue, no subjective hyperthermia, no subjective hypothermia Eyes: no blurry vision, no xerophthalmia ENT: no sore throat, no nodules palpated in throat, no dysphagia/odynophagia, no hoarseness Cardiovascular: no Chest Pain, no Shortness of Breath, no palpitations, no leg swelling Respiratory: no cough, no shortness of breath  Gastrointestinal: no Nausea/Vomiting/Diarhhea Musculoskeletal: no muscle/joint aches Skin: no rashes Neurological: no tremors, no numbness, no tingling, no dizziness Psychiatric: no depression, no anxiety  PE: BP 138/82   Pulse 72   Ht '5\' 10"'$  (1.778 m)   Wt 254 lb 12.8 oz (115.6 kg)   BMI 36.56 kg/m , Body mass index is 36.56 kg/m. Wt Readings from Last 3 Encounters:  06/21/22 254 lb 12.8 oz (115.6 kg)  06/17/22 253 lb 6.4 oz (114.9 kg)  03/12/22 162 lb (73.5 kg)    Constitutional: + BMI of 36.56- not in acute distress, normal state of mind Eyes: PERRLA, EOMI, no exophthalmos ENT: moist mucous membranes, no gross thyromegaly, no gross cervical lymphadenopathy Cardiovascular: normal precordial activity, Regular Rate and Rhythm, no Murmur/Rubs/Gallops Respiratory:  adequate breathing efforts,  no gross chest deformity, Clear to auscultation bilaterally Gastrointestinal: abdomen soft, Non -tender, No distension, Bowel Sounds present Musculoskeletal: + Scoliosis, no gross deformities, strength intact in all four extremities Skin: moist, warm, no rashes Neurological: no tremor with outstretched hands, Deep tendon reflexes normal in bilateral lower extremities.     CMP (  most recent) CMP     Component Value Date/Time   NA 141 03/12/2022 1511   NA 141 08/13/2020 1400   K 4.6 03/12/2022 1511   CL 104 03/12/2022 1511   CO2 23 03/12/2022 1511   GLUCOSE 96 03/12/2022 1511   BUN 13 03/12/2022 1511   BUN 14 08/13/2020 1400   CREATININE 0.84 03/12/2022 1511   CALCIUM 10.6 (H) 06/11/2022 1354   PROT 6.6 03/12/2022 1511   PROT 6.9 11/07/2017 1419   ALBUMIN 4.6 11/07/2017 1419   AST 12 03/12/2022 1511   ALT 9 03/12/2022 1511   ALKPHOS 122 (H) 11/07/2017 1419   BILITOT 0.4 03/12/2022 1511   BILITOT 0.4 11/07/2017 1419   GFRNONAA 60 11/04/2020 1437   GFRAA 70 11/04/2020 1437      Lipid Panel ( most recent) Lipid Panel     Component Value Date/Time   CHOL 229 (H) 06/17/2022 1101   TRIG 129 06/17/2022 1101   HDL 58 06/17/2022 1101   CHOLHDL 3.9 06/17/2022 1101   VLDL 48 (H) 08/02/2016 1455   LDLCALC 146 (H) 06/17/2022 1101      Lab Results  Component Value Date   TSH 2.360 06/11/2022   TSH 1.96 04/19/2017   TSH 2.42 06/11/2016   FREET4 1.00 06/11/2022     Latest Reference Range & Units Most Recent  Glucose 65 - 99 mg/dL 96 03/12/22 15:11  PTH, Intact 15 - 65 pg/mL 86 (H) 06/11/22 13:54  PTH-related peptide pmol/L <2.0 06/11/22 13:54  PTH Interp  Comment 06/11/22 13:54  TSH 0.450 - 4.500 uIU/mL 2.360 06/11/22 13:54  T4,Free(Direct) 0.82 - 1.77 ng/dL 1.00 06/11/22 13:54  (H): Data is abnormally high  Assessment: 1. Hypercalcemia / Hyperparathyroidism 2.  Hyperlipidemia  Plan: Patient has had several instances of elevated calcium, with the highest level being at 11  mg/dL.  Most recently, her calcium is better at 10.6, and PTH 86 improving from 123.  She is on vitamin D prescription and advised to continue.  - No apparent complications from hypercalcemia/hyperparathyroidism: no history of  nephrolithiasis,  osteoporosis,fragility fractures. No abdominal pain, no major mood disorders, no bone pain.  - I discussed with the patient about the physiology of calcium and parathyroid hormone, and possible  effects of  increased PTH/ Calcium , including kidney stones, cardiac dysrhythmias, osteoporosis, abdominal pain, etc.    -This time patient is still consistent with mild primary hyperparathyroidism, her it is also essential to obtain 24-hour urine calcium/creatinine to rule out the rare but important cause of mild elevation in calcium and PTH- [place] ( Familial Hypocalciuric Hypercalcemia), which may not require any active intervention.  Will be considered for 24-hour urine studies after next visit.  I discussed her bone density results which are negative. Her major health risk seems to be her dyslipidemia.  She is a good candidate for lifestyle medicine.  - she acknowledges that there is a room for improvement in her food and drink choices. - Suggestion is made for her to avoid simple carbohydrates  from her diet including Cakes, Sweet Desserts, Ice Cream, Soda (diet and regular), Sweet Tea, Candies, Chips, Cookies, Store Bought Juices, Alcohol , Artificial Sweeteners,  Coffee Creamer, and "Sugar-free" Products, Lemonade. This will help patient to have more stable blood glucose profile and potentially avoid unintended weight gain.  The following Lifestyle Medicine recommendations according to [place]  [facility]) were discussed and and offered to patient and she  agrees to start the journey:  A. Whole Foods, Plant-Based Nutrition comprising of fruits and vegetables, plant-based proteins, whole-grain carbohydrates was discussed in detail with  the patient.   A list for source of those nutrients were also provided to the patient.  Patient will use only water or unsweetened tea for hydration. B.  The need to stay away from risky substances including alcohol, smoking; obtaining 7 to 9 hours of restorative sleep, at least 150 minutes of moderate intensity exercise weekly, the importance of healthy social connections,  and stress management techniques were discussed. C.  A full color page of  Calorie density of various food groups per pound showing examples of each food groups was provided to the patient.   She is advised to maintain close follow-up with her PMD.   I spent 32 minutes in the care of the patient today including review of labs from Thyroid Function, CMP, and other relevant labs ; imaging/biopsy records (current and previous including abstractions from other facilities); face-to-face time discussing  her lab results and symptoms, medications doses, her options of short and long term treatment based on the latest standards of care / guidelines;   and documenting the encounter.  Laureen Ochs  participated in the discussions, expressed understanding, and voiced agreement with the above plans.  All questions were answered to her satisfaction. she is encouraged to contact clinic should she have any questions or concerns prior to her return visit.   - Return in about 3 months (around 09/20/2022) for Fasting Labs  in AM B4 8.   Glade Lloyd, MD [facility] [address] [place], [place] [address] Phone: [phone]  Fax: [phone]    This note was partially dictated with voice recognition software. Similar sounding words can be transcribed inadequately or may not  be corrected upon review.  06/21/2022, 4:21 PM

## 2022-06-28 ENCOUNTER — Other Ambulatory Visit: Payer: Self-pay | Admitting: Family Medicine

## 2022-07-01 ENCOUNTER — Encounter: Payer: Self-pay | Admitting: Family Medicine

## 2022-07-07 ENCOUNTER — Telehealth: Payer: Self-pay

## 2022-07-07 NOTE — Telephone Encounter (Signed)
Pt has not heard back regarding the 24 hour blood pressure monitor. Do you know who the order may have went to? I checked with Vonzell Schlatter and she doesn't have any information on it. Thank you!

## 2022-07-08 ENCOUNTER — Telehealth: Payer: Self-pay

## 2022-07-08 ENCOUNTER — Telehealth: Payer: Self-pay | Admitting: Cardiology

## 2022-07-08 NOTE — Telephone Encounter (Signed)
Spoke with Ermalinda Barrios, Dr. Jacolyn Reedy nurse at [facility]. The 24 hour BP monitors are booking 2-3 months out. Ermalinda Barrios suggested, if you are agreeable, for the pt to purchase a BP cuff and do 1-2 weeks of monitoring at home after taking medication, if you don't want to wait the 2-3 months. Ermalinda Barrios states the majority of the Cardiologists do this for their patient's due to the wait.  If you would like me to peruse the 24 hour monitor, I have the fax number to send the order over. Thank you.

## 2022-07-08 NOTE — Telephone Encounter (Signed)
Melanie Hurst from [facility] wants to know if we can do 24 hour bp monitor for pt.

## 2022-07-08 NOTE — Telephone Encounter (Signed)
Tried calling Karle Starch at [facility] back, and she was currently rooming a pt at this time.   Was advised to try her again in 10-15 mins.  Will reach out again at that time.

## 2022-07-08 NOTE — Telephone Encounter (Signed)
Was able to make contact with Melanie Hurst at [facility].   Per Melanie Hurst, the MD she is working with is considering ordering a 24 hour BP monitor on this pt, and wants to know how to go about doing this.   Informed Melanie Hurst that I spoke with our monitor tech about this process.  Per Darrick Penna in monitors, Melanie Hurst would need to have the ordering Provider MD to fax an order to our office at [phone] ATTN: [place].  That way Darrick Penna can input the order into epic and get the pt scheduled accordingly to have this placed thereafter.    Did inform Melanie Hurst that [facility] did mention our 24 hr BP monitors are running about 2 months out, before a pt can have this placed, due to limited supply and current ones being used at this time.  Did advise Melanie Hurst if the PCP didn't want to wait that long until pt can receive BP monitor, she could always advise the pt to purchase an automated BP cuff from a pharmacy, and take/record her findings for the requested duration, and send those to them thereafter to advise on.   Melanie Hurst verbalized understanding and agrees with this plan.  Melanie Hurst will endorse all this information to her Physician.  Melanie Hurst was gracious for all the assistance provided.  Will cc this message to our [facility] [place] as an FYI and for reference, if PCP decides to pursue in ordering 24 hr BP monitor.

## 2022-07-12 ENCOUNTER — Encounter: Payer: Self-pay | Admitting: Family Medicine

## 2022-07-13 ENCOUNTER — Other Ambulatory Visit: Payer: Self-pay | Admitting: Family Medicine

## 2022-07-13 DIAGNOSIS — G90A Postural orthostatic tachycardia syndrome (POTS): Secondary | ICD-10-CM

## 2022-07-14 ENCOUNTER — Other Ambulatory Visit: Payer: Self-pay | Admitting: Family Medicine

## 2022-07-14 NOTE — Telephone Encounter (Signed)
Requested Prescriptions  Pending Prescriptions Disp Refills  . venlafaxine XR (EFFEXOR-XR) 75 MG 24 hr capsule [Pharmacy Med Name: VENLAFAXINE  CAP '75MG'$  ER] 180 capsule 1    Sig: Take 2 capsules each morning and 1 capsule at bedtime     Psychiatry: Antidepressants - SNRI - desvenlafaxine & venlafaxine Failed - 07/14/2022  8:10 AM      Failed - Lipid Panel in normal range within the last 12 months    Cholesterol  Date Value Ref Range Status  06/17/2022 229 (H) <200 mg/dL Final   LDL Cholesterol (Calc)  Date Value Ref Range Status  06/17/2022 146 (H) mg/dL (calc) Final    Comment:    Reference range: <100 . Desirable range <100 mg/dL for primary prevention;   <70 mg/dL for patients with CHD or diabetic patients  with > or = 2 CHD risk factors. Marland Kitchen LDL-C is now calculated using the Martin-Hopkins  calculation, which is a validated novel method providing  better accuracy than the Friedewald equation in the  estimation of LDL-C.  Cresenciano Genre et al. Annamaria Helling. 1443;154(00): 2061-2068  (http://education.[facility].com/faq/FAQ164)    HDL  Date Value Ref Range Status  06/17/2022 58 > OR = 50 mg/dL Final   Triglycerides  Date Value Ref Range Status  06/17/2022 129 <150 mg/dL Final         Passed - Cr in normal range and within 360 days    Creat  Date Value Ref Range Status  03/12/2022 0.84 0.60 - 1.00 mg/dL Final         Passed - Completed PHQ-2 or PHQ-9 in the last 360 days      Passed - Last BP in normal range    BP Readings from Last 1 Encounters:  06/21/22 138/82         Passed - Valid encounter within last 6 months    Recent Outpatient Visits          4 months ago Change in stool   [place], Cammie Mcgee, MD   5 months ago Hypercalcemia   [place] Denn[facility] Schaumann Cammie Mcgee, MD   6 months ago Primary hypertension   [place] Dennard Schaumann, Cammie Mcgee, MD   11 months ago [place], Cammie Mcgee, MD   11 months ago Nausea   [place], Cammie Mcgee, MD

## 2022-07-21 ENCOUNTER — Ambulatory Visit (HOSPITAL_COMMUNITY): Payer: Medicare HMO | Attending: Family Medicine

## 2022-07-21 DIAGNOSIS — M4126 Other idiopathic scoliosis, lumbar region: Secondary | ICD-10-CM

## 2022-07-21 DIAGNOSIS — M419 Scoliosis, unspecified: Secondary | ICD-10-CM | POA: Insufficient documentation

## 2022-07-21 DIAGNOSIS — R29898 Other symptoms and signs involving the musculoskeletal system: Secondary | ICD-10-CM

## 2022-07-21 DIAGNOSIS — M545 Low back pain, unspecified: Secondary | ICD-10-CM

## 2022-07-21 NOTE — Therapy (Signed)
OUTPATIENT PHYSICAL THERAPY THORACOLUMBAR EVALUATION   Patient Name: Melanie Hurst MRN: [id number] DOB:1951/04/06, 71 y.o., female Today's Date: 07/21/2022   PT End of Session - 07/21/22 1354     Visit Number 1    Number of Visits 4    Date for PT Re-Evaluation 08/18/22    Authorization Type [employer] HMO    Progress Note Due on Visit 10    PT Start Time 1345    PT Stop Time 1428    PT Time Calculation (min) 43 min    Activity Tolerance Patient tolerated treatment well    Behavior During Therapy WFL for tasks assessed/performed             Past Medical History:  Diagnosis Date   Anemia    Anxiety    Chronic fatigue syndrome with fibromyalgia    Depression    Fibromyalgia    GERD (gastroesophageal reflux disease)    Hypertension    IBS (irritable bowel syndrome)    Obesity    Scoliosis    Sleep apnea    cpap at 7 cm   Tremor, essential    Past Surgical History:  Procedure Laterality Date   COLONOSCOPY  10/2021   ESOPHAGOGASTRODUODENOSCOPY  10/2021   PCOS surgery  1971   TONSILECTOMY, ADENOIDECTOMY, BILATERAL MYRINGOTOMY AND TUBES  1962   Patient Active Problem List   Diagnosis Date Noted   Mixed hyperlipidemia 06/21/2022   Hypercalcemia 03/12/2022   Other hyperparathyroidism ([place]) 24/06/7352   Periumbilical hernia 29/92/4268   Large hiatal hernia 04/28/2020   Levoscoliosis 04/28/2020   Sleep apnea    Thoracogenic scoliosis of thoracolumbar region 11/07/2017   Mixed action and resting tremor 11/07/2017   Obesity (BMI 35.0-39.9 without comorbidity) 11/07/2017   OSA on CPAP 11/07/2017   Dyspnea 03/19/2013   Depression    Hypertension     PCP: Jenna Luo  REFERRING PROVIDER: [name], MD [facility]   REFERRING DIAG: M41.9 (ICD-10-CM) - Scoliosis of lumbar spine, unspecified scoliosis type   Rationale for Evaluation and Treatment Rehabilitation  THERAPY DIAG:  Low back pain, unspecified back pain laterality,  unspecified chronicity, unspecified whether sciatica present - Plan: PT plan of care cert/re-cert  Other symptoms and signs involving the musculoskeletal system - Plan: PT plan of care cert/re-cert  Other idiopathic scoliosis, lumbar region - Plan: PT plan of care cert/re-cert  ONSET DATE: since puberty, 8th grade  SUBJECTIVE:  SUBJECTIVE STATEMENT: Scoliosis since 8th grade.  Participated in a study with exercise versus surgery; did not seem to help much.  Since then have just dealt with it.  Used to work as a Educational psychologist; no longer working.  I mostly sit in my chair.  Patient has a picture on her phone of her lumbar spine; appears to have severe scoliosis PERTINENT HISTORY:  Has had some issue with shoulders "falling out" of joint; hips also feel like they sublux.  PAIN:  Are you having pain? Yes: NPRS scale: 1-7/10 Pain location: mid to low back Pain description: aching, sore and sharp when it's bad Aggravating factors: standing and walking Relieving factors: sitting and resting   PRECAUTIONS: None  WEIGHT BEARING RESTRICTIONS: No  FALLS:  Has patient fallen in last 6 months? No  LIVING ENVIRONMENT: Lives with: lives with their spouse Lives in: House/apartment Stairs: No Has following equipment at home: Single point cane, Grab bars, and Ramped entry  OCCUPATION: retired  PLOF: Independent with household mobility with device  PATIENT GOALS: I want to get stronger   OBJECTIVE:   DIAGNOSTIC FINDINGS:  Has a picture on her phone of low back x-ray; severe scoliosis  PATIENT SURVEYS:  FOTO 40  COGNITION:  Overall cognitive status: Within functional limits for tasks assessed     SENSATION: Right foot  POSTURE: rounded shoulders, forward head, and severe scoliosis ; forward flexed  trunk  PALPATION: Rib hump left side; left shoulder sits higher than the right  LUMBAR ROM:   Active  A/PROM  eval  Flexion 80% "pulling"  Extension 20% available**  Right lateral flexion   Left lateral flexion   Right rotation   Left rotation    (Blank rows = not tested)    LOWER EXTREMITY MMT:    MMT Right eval Left eval  Hip flexion 4+ 4+  Hip extension    Hip abduction    Hip adduction    Hip internal rotation    Hip external rotation    Knee flexion    Knee extension 4+ 4+  Ankle dorsiflexion 5 5  Ankle plantarflexion    Ankle inversion    Ankle eversion     (Blank rows = not tested)  FUNCTIONAL TESTS:  5 times sit to stand: 32 sec using arms to assist up to standing  GAIT: Distance walked: 50 ft Assistive device utilized: Single point cane Level of assistance: SBA Comments: antalgic slow gait    TODAY'S TREATMENT:  Physical therapy evaluation and HEP instruction  PATIENT EDUCATION:  Education details: Patient educated on exam findings, POC, scope of PT, HEP, and education on aquatic therapy. Person educated: Patient Education method: Explanation, Demonstration, and Handouts Education comprehension: verbalized understanding, returned demonstration, verbal cues required, and tactile cues required   HOME EXERCISE PROGRAM: Decompression exercises 1-3; relaxing on back, head press, shoulder press  ASSESSMENT:  CLINICAL IMPRESSION: Patient is a 70 y.o. female who was seen today for physical therapy evaluation and treatment for M41.9 (ICD-10-CM) - Scoliosis of lumbar spine, unspecified scoliosis type . Patient presents on evaluation with severe scoliosis, decreased mobility of the spine, pain and antalgic gait that negatively impact her ability to walk more than household distances; to stand for a prolonged period of time and to perform housework.  Patient will benefit from continued skilled therapy services to address deficits and promote return to  optimal function.       OBJECTIVE IMPAIRMENTS: Abnormal gait, decreased activity tolerance, decreased balance, decreased coordination, decreased endurance,  decreased mobility, difficulty walking, decreased ROM, decreased strength, hypomobility, impaired perceived functional ability, impaired flexibility, postural dysfunction, and pain.   ACTIVITY LIMITATIONS: carrying, lifting, bending, sitting, standing, squatting, sleeping, stairs, bed mobility, bathing, locomotion level, and caring for others  PARTICIPATION LIMITATIONS: meal prep, cleaning, shopping, and community activity  REHAB POTENTIAL: Good  CLINICAL DECISION MAKING: Stable/uncomplicated  EVALUATION COMPLEXITY: Low   GOALS: Goals reviewed with patient? Yes  SHORT TERM GOALS: Target date: 08/04/2022   patient will be independent with initial HEP Baseline: Goal status: INITIAL  2.  Patient will improve 5 x STS score from 32 sec to 28 sec to demonstrate improved functional mobility and increased lower extremity strength.   Baseline:  Goal status: INITIAL  LONG TERM GOALS: Target date: 08/18/2022  Patient will report at least 50% improvement in overall symptoms and/or function to demonstrate improved functional mobility  Baseline:  Goal status: INITIAL  2.  Patient will improve FOTO score to predicted value to demonstrate improved functional mobility  Baseline: 40 Goal status: INITIAL  3.  Patient will improve 5 x STS score from 32 sec to 25 sec to demonstrate improved functional mobility and increased lower extremity strength.   Baseline:  Goal status: INITIAL  4.  Patient will be independent in self management strategies to improve quality of life and functional outcomes.  Baseline:  Goal status: INITIAL  5.  Patient will increase lumbar extension to 50% available to improve posture and ability to stand upright to perform kitchen tasks. Baseline:  Goal status: INITIAL PLAN: PT FREQUENCY: 1x/week  PT  DURATION: 4 weeks  PLANNED INTERVENTIONS: Therapeutic exercises, Therapeutic activity, Neuromuscular re-education, Balance training, Gait training, Patient/Family education, Joint manipulation, Joint mobilization, Stair training, Orthotic/Fit training, DME instructions, Aquatic Therapy, Dry Needling, Electrical stimulation, Spinal manipulation, Spinal mobilization, Cryotherapy, Moist heat, Compression bandaging, scar mobilization, Splintting, Taping, Traction, Ultrasound, Ionotophoresis '4mg'$ /ml Dexamethasone, and Manual therapy   PLAN FOR NEXT SESSION: Review goals and HEP; progress decompression and postural strengthening exercises. Can only afford 1 x a week; please progress HEP as able.    2:52 PM, 07/21/22 Melanie Hurst MPT [facility] [place] [phone]

## 2022-07-23 ENCOUNTER — Other Ambulatory Visit: Payer: Self-pay | Admitting: Family Medicine

## 2022-07-29 ENCOUNTER — Encounter: Payer: Self-pay | Admitting: Family Medicine

## 2022-08-02 ENCOUNTER — Other Ambulatory Visit: Payer: Self-pay

## 2022-08-02 DIAGNOSIS — G4710 Hypersomnia, unspecified: Secondary | ICD-10-CM

## 2022-08-02 DIAGNOSIS — F333 Major depressive disorder, recurrent, severe with psychotic symptoms: Secondary | ICD-10-CM

## 2022-08-02 MED ORDER — VENLAFAXINE HCL ER 75 MG PO CP24: ORAL_CAPSULE | ORAL | 0 refills | Status: DC

## 2022-08-03 DIAGNOSIS — H5213 Myopia, bilateral: Secondary | ICD-10-CM | POA: Diagnosis not present

## 2022-08-03 DIAGNOSIS — H524 Presbyopia: Secondary | ICD-10-CM | POA: Diagnosis not present

## 2022-08-03 DIAGNOSIS — H33301 Unspecified retinal break, right eye: Secondary | ICD-10-CM | POA: Diagnosis not present

## 2022-08-03 DIAGNOSIS — H2513 Age-related nuclear cataract, bilateral: Secondary | ICD-10-CM | POA: Diagnosis not present

## 2022-08-03 DIAGNOSIS — H353130 Nonexudative age-related macular degeneration, bilateral, stage unspecified: Secondary | ICD-10-CM | POA: Diagnosis not present

## 2022-08-03 DIAGNOSIS — H401421 Capsular glaucoma with pseudoexfoliation of lens, left eye, mild stage: Secondary | ICD-10-CM | POA: Diagnosis not present

## 2022-08-03 DIAGNOSIS — H52222 Regular astigmatism, left eye: Secondary | ICD-10-CM | POA: Diagnosis not present

## 2022-08-06 ENCOUNTER — Ambulatory Visit (HOSPITAL_COMMUNITY): Payer: Medicare HMO

## 2022-08-06 ENCOUNTER — Encounter (HOSPITAL_COMMUNITY): Payer: Self-pay

## 2022-08-06 DIAGNOSIS — M419 Scoliosis, unspecified: Secondary | ICD-10-CM | POA: Diagnosis not present

## 2022-08-06 DIAGNOSIS — M545 Low back pain, unspecified: Secondary | ICD-10-CM

## 2022-08-06 DIAGNOSIS — M4126 Other idiopathic scoliosis, lumbar region: Secondary | ICD-10-CM

## 2022-08-06 DIAGNOSIS — R29898 Other symptoms and signs involving the musculoskeletal system: Secondary | ICD-10-CM

## 2022-08-06 NOTE — Therapy (Signed)
OUTPATIENT PHYSICAL THERAPY THORACOLUMBAR TREATMENT   Patient Name: Melanie Hurst MRN: [id number] DOB:03/28/1951, 71 y.o., female Today's Date: 08/06/2022   PT End of Session - 08/06/22 1208     Visit Number 2    Number of Visits 4    Date for PT Re-Evaluation 08/18/22    Authorization Type [employer] HMO    Progress Note Due on Visit 10    PT Start Time 1113    PT Stop Time 1205    PT Time Calculation (min) 52 min    Activity Tolerance Patient tolerated treatment well    Behavior During Therapy WFL for tasks assessed/performed              Past Medical History:  Diagnosis Date   Anemia    Anxiety    Chronic fatigue syndrome with fibromyalgia    Depression    Fibromyalgia    GERD (gastroesophageal reflux disease)    Hypertension    IBS (irritable bowel syndrome)    Obesity    Scoliosis    Sleep apnea    cpap at 7 cm   Tremor, essential    Past Surgical History:  Procedure Laterality Date   COLONOSCOPY  10/2021   ESOPHAGOGASTRODUODENOSCOPY  10/2021   PCOS surgery  1971   TONSILECTOMY, ADENOIDECTOMY, BILATERAL MYRINGOTOMY AND TUBES  1962   Patient Active Problem List   Diagnosis Date Noted   Mixed hyperlipidemia 06/21/2022   Hypercalcemia 03/12/2022   Other hyperparathyroidism ([place]) 83/41/9622   Periumbilical hernia 29/79/8921   Large hiatal hernia 04/28/2020   Levoscoliosis 04/28/2020   Sleep apnea    Thoracogenic scoliosis of thoracolumbar region 11/07/2017   Mixed action and resting tremor 11/07/2017   Obesity (BMI 35.0-39.9 without comorbidity) 11/07/2017   OSA on CPAP 11/07/2017   Dyspnea 03/19/2013   Depression    Hypertension     PCP: Jenna Luo  REFERRING PROVIDER: [name], MD [facility]  Cardiologist on 08/11/22 for POTS  REFERRING DIAG: M41.9 (ICD-10-CM) - Scoliosis of lumbar spine, unspecified scoliosis type   Rationale for Evaluation and Treatment Rehabilitation  THERAPY DIAG:  Low back pain,  unspecified back pain laterality, unspecified chronicity, unspecified whether sciatica present  Other symptoms and signs involving the musculoskeletal system  Other idiopathic scoliosis, lumbar region  ONSET DATE: since puberty, 8th grade  SUBJECTIVE:                                                                                                                                                                                           SUBJECTIVE STATEMENT: 08/06/22:  Pt reports she feels weak today.  Has began the HEP 3times since initial eval, has been at beach for a week and admits to not completing as regularly as she should.    Eval subjective: Scoliosis since 8th grade.  Participated in a study with exercise versus surgery; did not seem to help much.  Since then have just dealt with it.  Used to work as a Educational psychologist; no longer working.  I mostly sit in my chair.  Patient has a picture on her phone of her lumbar spine; appears to have severe scoliosis PERTINENT HISTORY:  Has had some issue with shoulders "falling out" of joint; hips also feel like they sublux.  PAIN:  Are you having pain? Yes: NPRS scale: 3/10 Pain location: mid to low back Pain description: sore and tender Aggravating factors: standing and walking Relieving factors: sitting and resting   PRECAUTIONS: None  WEIGHT BEARING RESTRICTIONS: No  FALLS:  Has patient fallen in last 6 months? No  LIVING ENVIRONMENT: Lives with: lives with their spouse Lives in: House/apartment Stairs: No Has following equipment at home: Single point cane, Grab bars, and Ramped entry  OCCUPATION: retired  PLOF: Independent with household mobility with device  PATIENT GOALS: I want to get stronger   OBJECTIVE:   DIAGNOSTIC FINDINGS:  Has a picture on her phone of low back x-ray; severe scoliosis  PATIENT SURVEYS:  FOTO 40  COGNITION:  Overall cognitive status: Within functional limits for tasks  assessed     SENSATION: Right foot  POSTURE: rounded shoulders, forward head, and severe scoliosis ; forward flexed trunk  PALPATION: Rib hump left side; left shoulder sits higher than the right  LUMBAR ROM:   Active  A/PROM  eval  Flexion 80% "pulling"  Extension 20% available**  Right lateral flexion   Left lateral flexion   Right rotation   Left rotation    (Blank rows = not tested)    LOWER EXTREMITY MMT:    MMT Right eval Left eval  Hip flexion 4+ 4+  Hip extension    Hip abduction    Hip adduction    Hip internal rotation    Hip external rotation    Knee flexion    Knee extension 4+ 4+  Ankle dorsiflexion 5 5  Ankle plantarflexion    Ankle inversion    Ankle eversion     (Blank rows = not tested)  FUNCTIONAL TESTS:  5 times sit to stand: 32 sec using arms to assist up to standing  GAIT: Distance walked: 50 ft Assistive device utilized: Single point cane Level of assistance: SBA Comments: antalgic slow gait    TODAY'S TREATMENT:  08/06/22: Reviewed goals, educated importance of HEP compliance for maximal benefits, pt able to recall and demonstrate appropriate mechanics with min cueing   Log rolling   Decompression exercises 2-5 10x 5" with 2&3, 5x 5" reps with 4&5   Decompression with theraband 1-4 5 reps with 5" holds   Diaphragmatic breathing with 1 hand on chest, 1 on stomach x 1 min   Abdominal sets paired with exhale x 1 3" ab sets   Bridge 10x  07/21/22: Physical therapy evaluation and HEP instruction  PATIENT EDUCATION:  Education details: Patient educated on exam findings, POC, scope of PT, HEP, and education on aquatic therapy. Person educated: Patient Education method: Explanation, Demonstration, and Handouts Education comprehension: verbalized understanding, returned demonstration, verbal cues required, and tactile cues required   HOME EXERCISE PROGRAM: Decompression exercises 1-3; relaxing on back, head press, shoulder  press Decompression  exercises 4-5 and postural decompression with RTB  Access Code: [email or web address] URL: [email or web address] Date: 08/06/2022 Prepared by: Ihor Austin  Exercises - Supine Transversus Abdominis Bracing - Hands on Stomach  - 1 x daily - 7 x weekly - 3 sets - 10 reps - Supine Bridge  - 1 x daily - 7 x weekly - 3 sets - 10 reps  ASSESSMENT:  CLINICAL IMPRESSION: Reviewed goals, educated importance of HEP compliance, pt able to recall and demonstrate appropriate mechanics with min cueing to improve cervical retraction and increased hold times.  Reviewed and added 4 & 5 with decompression to HEP.  Educated log rolling mechanics to reduce strain on lumbar mm, presents with some difficulty getting LE into bed due to gluteal weakness.  Added core stability exercises and gluteal strengthening exercises to POC and additional exercises add to HEP.     OBJECTIVE IMPAIRMENTS: Abnormal gait, decreased activity tolerance, decreased balance, decreased coordination, decreased endurance, decreased mobility, difficulty walking, decreased ROM, decreased strength, hypomobility, impaired perceived functional ability, impaired flexibility, postural dysfunction, and pain.   ACTIVITY LIMITATIONS: carrying, lifting, bending, sitting, standing, squatting, sleeping, stairs, bed mobility, bathing, locomotion level, and caring for others  PARTICIPATION LIMITATIONS: meal prep, cleaning, shopping, and community activity  REHAB POTENTIAL: Good  CLINICAL DECISION MAKING: Stable/uncomplicated  EVALUATION COMPLEXITY: Low   GOALS: Goals reviewed with patient? Yes  SHORT TERM GOALS: Target date: 08/04/2022   patient will be independent with initial HEP Baseline: Goal status: IN PROGRESS  2.  Patient will improve 5 x STS score from 32 sec to 28 sec to demonstrate improved functional mobility and increased lower extremity strength.   Baseline:  Goal status: IN PROGRESS  LONG TERM  GOALS: Target date: 08/18/2022  Patient will report at least 50% improvement in overall symptoms and/or function to demonstrate improved functional mobility  Baseline:  Goal status: IN PROGRESS  2.  Patient will improve FOTO score to predicted value to demonstrate improved functional mobility  Baseline: 40 Goal status: IN PROGRESS  3.  Patient will improve 5 x STS score from 32 sec to 25 sec to demonstrate improved functional mobility and increased lower extremity strength.   Baseline:  Goal status: IN PROGRESS  4.  Patient will be independent in self management strategies to improve quality of life and functional outcomes.  Baseline:  Goal status: IN PROGRESS  5.  Patient will increase lumbar extension to 50% available to improve posture and ability to stand upright to perform kitchen tasks. Baseline:  Goal status: IN PROGRESS PLAN: PT FREQUENCY: 1x/week  PT DURATION: 4 weeks  PLANNED INTERVENTIONS: Therapeutic exercises, Therapeutic activity, Neuromuscular re-education, Balance training, Gait training, Patient/Family education, Joint manipulation, Joint mobilization, Stair training, Orthotic/Fit training, DME instructions, Aquatic Therapy, Dry Needling, Electrical stimulation, Spinal manipulation, Spinal mobilization, Cryotherapy, Moist heat, Compression bandaging, scar mobilization, Splintting, Taping, Traction, Ultrasound, Ionotophoresis '4mg'$ /ml Dexamethasone, and Manual therapy   PLAN FOR NEXT SESSION: progress decompression and postural strengthening exercises. Add STS and clam exercises next session to HEP. Can only afford 1 x a week; please progress HEP as able.   Ihor Austin, LPTA/CLT; [facility] [phone]  12:49 PM, 08/06/22

## 2022-08-08 NOTE — Progress Notes (Unsigned)
Cardiology Office Note:    Date:  08/11/2022   ID:  Melanie Hurst, DOB 05-11-1951, MRN [id number]  PCP:  [name], MD   [place] Prov[facility] Cardiologist:  Freada Bergeron, MD {  Referring MD: [name], MD    History of Present Illness:    Melanie Hurst is a 71 y.o. female with a hx of anemia, anxiety, depression, HTN and fibromyalgia who presents to clinic for follow-up.  Was initially seen in clinic on 07/2020 for palpitations and SOB on exertion. Cardiac monitor 08/2020 with occasional PACs (4.2%), occasional PVCs (2.2%), 89 runs of SVT with longest lasting 76mn. Myoview 09/2020 with normal LVEF and no ischemia/infarction. We recommended metoprolol at that time. She did not follow-up.  Today, the patient states that she is concerned about POTs. She has had episodes of lightheadedness, palpitations, and SOB especially with position changes as well as with activity. Has been monitoring her blood pressure at home and notes that it significantly decreased with standing up but she is not sure of the numbers. She also states her HR go up significantly. She remains hydrated throughout the day but does not usually eat breakfast as it makes her tired. No chest pain or syncope. Has trace LE edema that improves with leg elevation.  Blood pressure has been running 130/80s at home. Amlodipine was stopped due to concern for low blood pressure.   Orthostatics today with significant increase in heart rate but no change in blood pressure when going from laying>seated>standing.  Past Medical History:  Diagnosis Date   Anemia    Anxiety    Chronic fatigue syndrome with fibromyalgia    Depression    Fibromyalgia    GERD (gastroesophageal reflux disease)    Hypertension    IBS (irritable bowel syndrome)    Obesity    Scoliosis    Sleep apnea    cpap at 7 cm   Tremor, essential     Past Surgical History:  Procedure Laterality Date   COLONOSCOPY  10/2021    ESOPHAGOGASTRODUODENOSCOPY  10/2021   PCOS surgery  1971   TONSILECTOMY, ADENOIDECTOMY, BILATERAL MYRINGOTOMY AND TUBES  1962    Current Medications: Current Meds  Medication Sig   AMBULATORY NON FORMULARY MEDICATION Medication Name: Fesol complete (Iron) one tablet by mouth once daily   ARIPiprazole (ABILIFY) 5 MG tablet Take 1 tablet by mouth once daily   B Complex Vitamins (VITAMIN B COMPLEX PO) Take 1 tablet by mouth daily.   losartan (COZAAR) 50 MG tablet Take 1 tablet (50 mg total) by mouth 2 (two) times daily.   Methylsulfonylmethane (MSM PO) Take 1 tablet by mouth daily.   metoprolol tartrate (LOPRESSOR) 25 MG tablet Take 1 tablet (25 mg total) by mouth 2 (two) times daily.   modafinil (PROVIGIL) 200 MG tablet Take 1 tablet (200 mg total) by mouth in the morning and at bedtime.   Multiple Vitamin (MULTIVITAMIN ADULT PO) Take 1 tablet by mouth daily.   Multiple Vitamins-Minerals (PRESERVISION AREDS 2 PO) Take by mouth daily.   nystatin (MYCOSTATIN) 100000 UNIT/ML suspension Take 5 mLs (500,000 Units total) by mouth 4 (four) times daily.   rosuvastatin (CRESTOR) 10 MG tablet Take 1 tablet (10 mg total) by mouth daily.   venlafaxine XR (EFFEXOR-XR) 75 MG 24 hr capsule Take 2 capsules each morning and 1 capsule at bedtime   Vitamin D, Ergocalciferol, (DRISDOL) 1.25 MG (50000 UNIT) CAPS capsule Take 1 capsule (50,000 Units total) by mouth every 7 (  seven) days.   VITAMIN E PO Take 1 tablet by mouth daily.   VITAMIN K PO Take 1 tablet by mouth daily.   ZINC OXIDE PO Take 1 tablet by mouth daily.     Allergies:   Asa [aspirin] and Codeine   Social History   Socioeconomic History   Marital status: Married    Spouse name: Not on file   Number of children: 0   Years of education: Not on file   Highest education level: Not on file  Occupational History   Occupation: retired  Tobacco Use   Smoking status: Former    Years: 6.00    Types: Cigarettes    Quit date: 10/11/2009    Years  since quitting: 12.8   Smokeless tobacco: Never  Vaping Use   Vaping Use: Former   Devices: vaped to stop smoking  Substance and Sexual Activity   Alcohol use: Yes    Comment: Occasional wine   Drug use: No   Sexual activity: Not Currently  Other Topics Concern   Not on file  Social History Narrative   Not on file   Social Determinants of Health   Financial Resource Strain: Not on file  Food Insecurity: Not on file  Transportation Needs: Not on file  Physical Activity: Not on file  Stress: Not on file  Social Connections: Not on file     Family History: The patient's family history includes Breast cancer in her paternal aunt and sister; Cancer in her father; Colon cancer in her paternal aunt; Colon polyps in her father; Hyperlipidemia in her father and mother; Hypertension in her father and mother; Irritable bowel syndrome in her paternal aunt; Kidney failure in her father; Melanoma in her mother; Other in her mother; Stomach cancer in her maternal uncle; Stroke in her mother. There is no history of Esophageal cancer or Rectal cancer.  ROS:   Please see the history of present illness.     All other systems reviewed and are negative.  EKGs/Labs/Other Studies Reviewed:    The following studies were reviewed today: Myoview 09/2020: The left ventricular ejection fraction is normal (55-65%). There was no ST segment deviation noted during stress. The study is normal. This is a low risk study.   Normal pharmacologic nuclear study with no evidence for prior infarct or ischemia. Normal LVEF.  Cardiac Monitor 08/2020: Over the course of the 12 days that the patient wore the monitor, the predominant rhythm was sinus rhythm with average HR 82bpm There were 89 runs of SVT with the longest lasting 50 minutes with average HR 131 There was one 6 beat run of NSVT with HR 169bpm There were occasional PACs (4.2%, 59718); occasional PVCs (2.2%m 31523) No AFib, sustained VT or pauses    Gwyndolyn Kaufman, MD  EKG:  EKG is  ordered today.  The ekg ordered today demonstrates NSR, first degree AVB, HR 96  Recent Labs: 03/12/2022: ALT 9; BUN 13; Creat 0.84; Hemoglobin 12.9; Platelets 409; Potassium 4.6; Sodium 141 06/11/2022: Magnesium 2.3; TSH 2.360  Recent Lipid Panel    Component Value Date/Time   CHOL 229 (H) 06/17/2022 1101   TRIG 129 06/17/2022 1101   HDL 58 06/17/2022 1101   CHOLHDL 3.9 06/17/2022 1101   VLDL 48 (H) 08/02/2016 1455   LDLCALC 146 (H) 06/17/2022 1101     Risk Assessment/Calculations:           Physical Exam:    VS:  BP (!) 146/84   Pulse 96  Ht '5\' 10"'$  (1.778 m)   Wt 248 lb (112.5 kg)   SpO2 99%   BMI 35.58 kg/m     Wt Readings from Last 3 Encounters:  08/11/22 248 lb (112.5 kg)  06/21/22 254 lb 12.8 oz (115.6 kg)  06/17/22 253 lb 6.4 oz (114.9 kg)     GEN:  Well nourished, well developed in no acute distress HEENT: Normal NECK: No JVD; No carotid bruits CARDIAC: Tachycardic, regular, 1/6 systolic murmur RESPIRATORY:  Clear to auscultation without rales, wheezing or rhonchi  ABDOMEN: Soft, non-tender, non-distended MUSCULOSKELETAL:  No edema; No deformity  SKIN: Warm and dry NEUROLOGIC:  Alert and oriented x 3 PSYCHIATRIC:  Normal affect   ASSESSMENT:    1. Palpitations   2. SOB (shortness of breath)   3. SVT (supraventricular tachycardia)   4. Primary hypertension   5. Mixed hyperlipidemia    PLAN:    In order of problems listed above:  #Possible POTs: Patient reports significant palpitations, dizziness and SOB when going from a seated to a standing position and with exertion. Orthostatics today revealed no significant drop in blood pressure but with significant increase in HR to 150s with standing. Vitals concerning for possible POTs but may also have element of orthostatic hypotension per reported blood pressures. Discussed trial of metop to help with rapid HR to see if this helps as she has BP room. Also discussed  importance of hydration, compression socks, regular meals, and slow position changes.   #SVT: #Palpitations: Cardiac monitor with frequent runs of SVT with longest episode lasting 50mn, occasional PACs (4.2%), occasional PVCs (2.2%). We recommended metop at that time. This has since been stopped but she thinks she responded well to it. Will resume and monitor response given findings as above. -Start metop '25mg'$  BID and monitor response  #DOE: Myoview without evidence of ischemia. May be related to rapid HR with activity as detailed above but will repeat TTE to assess further. -Check TTE  #HTN: Elevated in clinic and at home.  -Continue losartan '50mg'$  daily -Start metop '25mg'$  BID as above  #HLD: -Continue crestor '10mg'$  daily -LDL 146 ; declined increasing statin at this time and wishes to trial lifestyle modifications           Medication Adjustments/Labs and Tests Ordered: Current medicines are reviewed at length with the patient today.  Concerns regarding medicines are outlined above.  Orders Placed This Encounter  Procedures   EKG 12-Lead   ECHOCARDIOGRAM COMPLETE   Meds ordered this encounter  Medications   metoprolol tartrate (LOPRESSOR) 25 MG tablet    Sig: Take 1 tablet (25 mg total) by mouth 2 (two) times daily.    Dispense:  180 tablet    Refill:  2    Patient Instructions  Medication Instructions:   START TAKING METOPROLOL TARTRATE 25 MG BY MOUTH TWICE DAILY  *If you need a refill on your cardiac medications before your next appointment, please call your pharmacy*    Testing/Procedures:  Your physician has requested that you have an echocardiogram. Echocardiography is a painless test that uses sound waves to create images of your heart. It provides your doctor with information about the size and shape of your heart and how well your heart's chambers and valves are working. This procedure takes approximately one hour. There are no restrictions for this  procedure. Please do NOT wear cologne, perfume, aftershave, or lotions (deodorant is allowed). Please arrive 15 minutes prior to your appointment time.    Follow-Up:  3 MONTHS WITH AN EXTENDER IN THE OFFICE    Important Information About Sugar         Signed, Freada Bergeron, MD  08/11/2022 4:12 PM    [place]

## 2022-08-11 ENCOUNTER — Ambulatory Visit: Payer: Medicare HMO | Attending: Cardiology | Admitting: Cardiology

## 2022-08-11 ENCOUNTER — Encounter: Payer: Self-pay | Admitting: Cardiology

## 2022-08-11 VITALS — BP 146/84 | HR 96 | Ht 70.0 in | Wt 248.0 lb

## 2022-08-11 DIAGNOSIS — I471 Supraventricular tachycardia, unspecified: Secondary | ICD-10-CM | POA: Diagnosis not present

## 2022-08-11 DIAGNOSIS — E782 Mixed hyperlipidemia: Secondary | ICD-10-CM

## 2022-08-11 DIAGNOSIS — R0602 Shortness of breath: Secondary | ICD-10-CM

## 2022-08-11 DIAGNOSIS — I10 Essential (primary) hypertension: Secondary | ICD-10-CM | POA: Diagnosis not present

## 2022-08-11 DIAGNOSIS — R002 Palpitations: Secondary | ICD-10-CM

## 2022-08-11 MED ORDER — METOPROLOL TARTRATE 25 MG PO TABS: 25.0000 mg | ORAL_TABLET | Freq: Two times a day (BID) | ORAL | 2 refills | Status: DC

## 2022-08-11 NOTE — Patient Instructions (Signed)
Medication Instructions:   START TAKING METOPROLOL TARTRATE 25 MG BY MOUTH TWICE DAILY  *If you need a refill on your cardiac medications before your next appointment, please call your pharmacy*    Testing/Procedures:  Your physician has requested that you have an echocardiogram. Echocardiography is a painless test that uses sound waves to create images of your heart. It provides your doctor with information about the size and shape of your heart and how well your heart's chambers and valves are working. This procedure takes approximately one hour. There are no restrictions for this procedure. Please do NOT wear cologne, perfume, aftershave, or lotions (deodorant is allowed). Please arrive 15 minutes prior to your appointment time.    Follow-Up:  3 MONTHS WITH AN EXTENDER IN THE OFFICE    Important Information About Sugar

## 2022-08-13 ENCOUNTER — Encounter: Payer: Self-pay | Admitting: Family Medicine

## 2022-08-13 ENCOUNTER — Other Ambulatory Visit: Payer: Self-pay

## 2022-08-13 DIAGNOSIS — F333 Major depressive disorder, recurrent, severe with psychotic symptoms: Secondary | ICD-10-CM

## 2022-08-13 DIAGNOSIS — G4733 Obstructive sleep apnea (adult) (pediatric): Secondary | ICD-10-CM

## 2022-08-13 MED ORDER — VENLAFAXINE HCL ER 75 MG PO CP24: ORAL_CAPSULE | ORAL | 0 refills | Status: DC

## 2022-08-13 MED ORDER — MODAFINIL 200 MG PO TABS: 200.0000 mg | ORAL_TABLET | Freq: Two times a day (BID) | ORAL | 1 refills | Status: DC

## 2022-08-16 ENCOUNTER — Encounter (HOSPITAL_COMMUNITY): Payer: Medicare HMO | Admitting: Physical Therapy

## 2022-08-16 ENCOUNTER — Encounter: Payer: Self-pay | Admitting: Cardiology

## 2022-08-17 ENCOUNTER — Telehealth: Payer: Self-pay

## 2022-08-17 ENCOUNTER — Other Ambulatory Visit: Payer: Self-pay

## 2022-08-17 DIAGNOSIS — F333 Major depressive disorder, recurrent, severe with psychotic symptoms: Secondary | ICD-10-CM

## 2022-08-17 DIAGNOSIS — F324 Major depressive disorder, single episode, in partial remission: Secondary | ICD-10-CM

## 2022-08-17 DIAGNOSIS — I10 Essential (primary) hypertension: Secondary | ICD-10-CM

## 2022-08-17 MED ORDER — ARIPIPRAZOLE 5 MG PO TABS: 5.0000 mg | ORAL_TABLET | Freq: Every day | ORAL | 1 refills | Status: DC

## 2022-08-17 MED ORDER — LOSARTAN POTASSIUM 50 MG PO TABS: 50.0000 mg | ORAL_TABLET | Freq: Two times a day (BID) | ORAL | 3 refills | Status: DC

## 2022-08-17 NOTE — Telephone Encounter (Signed)
Refill needed on patient's Provigil. Last RF was sent on 11/3 but was faxed and needs to be E-scribed. Pt uses [facility].

## 2022-08-18 ENCOUNTER — Other Ambulatory Visit: Payer: Self-pay | Admitting: Cardiology

## 2022-08-18 ENCOUNTER — Telehealth: Payer: Self-pay

## 2022-08-18 DIAGNOSIS — R002 Palpitations: Secondary | ICD-10-CM

## 2022-08-18 MED ORDER — DILTIAZEM HCL 30 MG PO TABS: 30.0000 mg | ORAL_TABLET | Freq: Two times a day (BID) | ORAL | 3 refills | Status: DC

## 2022-08-18 MED ORDER — DILTIAZEM HCL 30 MG PO TABS: 30.0000 mg | ORAL_TABLET | Freq: Two times a day (BID) | ORAL | 1 refills | Status: DC

## 2022-08-18 NOTE — Telephone Encounter (Signed)
PA RESPONSE FROM COVER MY MEDS:  Your information has been submitted to [place] Medicare Part D. [employer] D will review the request and will issue a decision, typically within 1-3 days from your submission. You can check the updated outcome later by reopening this request.  If [employer] has not responded in 1-3 days or if you have any questions about your ePA request, please contact [place] Medicare Part D at [phone]. If you think there may be a problem with your PA request, use our live chat feature at the bottom right.

## 2022-08-19 ENCOUNTER — Ambulatory Visit (HOSPITAL_COMMUNITY): Payer: Medicare HMO | Attending: Cardiology

## 2022-08-19 ENCOUNTER — Other Ambulatory Visit: Payer: Self-pay | Admitting: Family Medicine

## 2022-08-19 DIAGNOSIS — R0602 Shortness of breath: Secondary | ICD-10-CM | POA: Insufficient documentation

## 2022-08-19 DIAGNOSIS — G4733 Obstructive sleep apnea (adult) (pediatric): Secondary | ICD-10-CM

## 2022-08-19 LAB — ECHOCARDIOGRAM COMPLETE
Area-P 1/2: 6.22 cm2
S' Lateral: 3.1 cm

## 2022-08-19 MED ORDER — MODAFINIL 200 MG PO TABS: 200.0000 mg | ORAL_TABLET | Freq: Two times a day (BID) | ORAL | 1 refills | Status: DC

## 2022-08-21 ENCOUNTER — Other Ambulatory Visit: Payer: Self-pay | Admitting: Family Medicine

## 2022-08-21 DIAGNOSIS — F333 Major depressive disorder, recurrent, severe with psychotic symptoms: Secondary | ICD-10-CM

## 2022-08-21 DIAGNOSIS — F324 Major depressive disorder, single episode, in partial remission: Secondary | ICD-10-CM

## 2022-08-23 ENCOUNTER — Telehealth: Payer: Self-pay

## 2022-08-23 NOTE — Telephone Encounter (Signed)
Approvedon November 8 Your request has been approved Drug Venlafaxine HCl ER '75MG'$  er capsules  Spoke w/pt today, per pt stated that she wants her medication to be sent to [facility] and no [facility].

## 2022-08-23 NOTE — Telephone Encounter (Signed)
LVM for pt to called and verify [pharmacy for her venlafaxine   [facility] or [facility]?

## 2022-08-23 NOTE — Telephone Encounter (Signed)
Requested medication (s) are due for refill today - no  Requested medication (s) are on the active medication list -yes  Future visit scheduled -no  Last refill: 08/17/22 #30 1RF  Notes to clinic: non delegated Rx, request from different pharmacy  Requested Prescriptions  Pending Prescriptions Disp Refills   ARIPiprazole (ABILIFY) 5 MG tablet [Pharmacy Med Name: ARIPIPRAZOLE 5 MG TABLET] 30 tablet 0    Sig: Take 1 tablet by mouth once daily     Not Delegated - Psychiatry:  Antipsychotics - Second Generation (Atypical) - aripiprazole Failed - 08/21/2022  9:13 AM      Failed - This refill cannot be delegated      Failed - Last BP in normal range    BP Readings from Last 1 Encounters:  08/11/22 (!) 146/84         Failed - Lipid Panel in normal range within the last 12 months    Cholesterol  Date Value Ref Range Status  06/17/2022 229 (H) <200 mg/dL Final   LDL Cholesterol (Calc)  Date Value Ref Range Status  06/17/2022 146 (H) mg/dL (calc) Final    Comment:    Reference range: <100 . Desirable range <100 mg/dL for primary prevention;   <70 mg/dL for patients with CHD or diabetic patients  with > or = 2 CHD risk factors. Marland Kitchen LDL-C is now calculated using the Martin-Hopkins  calculation, which is a validated novel method providing  better accuracy than the Friedewald equation in the  estimation of LDL-C.  Cresenciano Genre et al. Annamaria Helling. 3244;010(27): 2061-2068  (http://education.[facility].com/faq/FAQ164)    HDL  Date Value Ref Range Status  06/17/2022 58 > OR = 50 mg/dL Final   Triglycerides  Date Value Ref Range Status  06/17/2022 129 <150 mg/dL Final         Failed - CMP within normal limits and completed in the last 12 months    Albumin  Date Value Ref Range Status  11/07/2017 4.6 3.6 - 4.8 g/dL Final   Alkaline Phosphatase  Date Value Ref Range Status  11/07/2017 122 (H) 39 - 117 IU/L Final   Alkaline phosphatase (APISO)  Date Value Ref Range Status   03/12/2022 137 37 - 153 U/L Final   ALT  Date Value Ref Range Status  03/12/2022 9 6 - 29 U/L Final   AST  Date Value Ref Range Status  03/12/2022 12 10 - 35 U/L Final   BUN  Date Value Ref Range Status  03/12/2022 13 7 - 25 mg/dL Final  08/13/2020 14 8 - 27 mg/dL Final   Calcium  Date Value Ref Range Status  06/11/2022 10.6 (H) 8.7 - 10.3 mg/dL Final   CO2  Date Value Ref Range Status  03/12/2022 23 20 - 32 mmol/L Final   Creat  Date Value Ref Range Status  03/12/2022 0.84 0.60 - 1.00 mg/dL Final   Glucose, Bld  Date Value Ref Range Status  03/12/2022 96 65 - 99 mg/dL Final    Comment:    .            Fasting reference interval .    Potassium  Date Value Ref Range Status  03/12/2022 4.6 3.5 - 5.3 mmol/L Final   Sodium  Date Value Ref Range Status  03/12/2022 141 135 - 146 mmol/L Final  08/13/2020 141 134 - 144 mmol/L Final   Total Bilirubin  Date Value Ref Range Status  03/12/2022 0.4 0.2 - 1.2 mg/dL Final   Bilirubin Total  Date Value Ref Range Status  11/07/2017 0.4 0.0 - 1.2 mg/dL Final   Protein, ur  Date Value Ref Range Status  04/01/2020 1+ (A) NEGATIVE Final   Total Protein  Date Value Ref Range Status  03/12/2022 6.6 6.1 - 8.1 g/dL Final  11/07/2017 6.9 6.0 - 8.5 g/dL Final   GFR, Est African American  Date Value Ref Range Status  11/04/2020 70 > OR = 60 mL/min/1.22m Final   eGFR  Date Value Ref Range Status  03/12/2022 75 > OR = 60 mL/min/1.759mFinal    Comment:    The eGFR is based on the CKD-EPI 2021 equation. To calculate  the new eGFR from a previous Creatinine or Cystatin C result, go to https://www.kidney.org/professionals/ kdoqi/gfr%5Fcalculator    GFR, Est Non African American  Date Value Ref Range Status  11/04/2020 60 > OR = 60 mL/min/1.7314minal         Passed - TSH in normal range and within 360 days    TSH  Date Value Ref Range Status  06/11/2022 2.360 0.450 - 4.500 uIU/mL Final         Passed -  Completed PHQ-2 or PHQ-9 in the last 360 days      Passed - Last Heart Rate in normal range    Pulse Readings from Last 1 Encounters:  08/11/22 96         Passed - Valid encounter within last 6 months    Recent Outpatient Visits           5 months ago Change in stool   BroAntelopearCammie McgeeD   6 months ago Hypercalcemia   BroPl[facility] WoodscDennard SchaumannarCammie McgeeD   8 months ago Primary hypertension   BroLansdownecDennard SchaumannarCammie McgeeD   1 year ago MicVandiverckard, WarCammie McgeeD   1 year ago Nausea   BroCheverlyckard, WarCammie McgeeD       Future Appointments             In 2 months ConElgie CollardA-C [facility]            Passed - CBC within normal limits and completed in the last 12 months    WBC  Date Value Ref Range Status  03/12/2022 7.9 3.8 - 10.8 Thousand/uL Final   RBC  Date Value Ref Range Status  03/12/2022 4.84 3.80 - 5.10 Million/uL Final   Hemoglobin  Date Value Ref Range Status  03/12/2022 12.9 11.7 - 15.5 g/dL Final   HCT  Date Value Ref Range Status  03/12/2022 40.6 35.0 - 45.0 % Final   MCHC  Date Value Ref Range Status  03/12/2022 31.8 (L) 32.0 - 36.0 g/dL Final   [facility] Cent[facility] Value Ref Range Status  03/12/2022 26.7 (L) 27.0 - 33.0 pg Final   MCV  Date Value Ref Range Status  03/12/2022 83.9 80.0 - 100.0 fL Final   No results found for: "PLTCOUNTKUC", "LABPLAT", "POCPLA" RDW  Date Value Ref Range Status  03/12/2022 14.6 11.0 - 15.0 % Final            Requested Prescriptions  Pending Prescriptions Disp Refills   ARIPiprazole (ABILIFY) 5 MG tablet [Pharmacy Med Name: ARIPIPRAZOLE 5 MG TABLET] 30 tablet 0    Sig: Take 1 tablet by mouth  once daily     Not Delegated - Psychiatry:  Antipsychotics - Second Generation (Atypical) - aripiprazole Failed -  08/21/2022  9:13 AM      Failed - This refill cannot be delegated      Failed - Last BP in normal range    BP Readings from Last 1 Encounters:  08/11/22 (!) 146/84         Failed - Lipid Panel in normal range within the last 12 months    Cholesterol  Date Value Ref Range Status  06/17/2022 229 (H) <200 mg/dL Final   LDL Cholesterol (Calc)  Date Value Ref Range Status  06/17/2022 146 (H) mg/dL (calc) Final    Comment:    Reference range: <100 . Desirable range <100 mg/dL for primary prevention;   <70 mg/dL for patients with CHD or diabetic patients  with > or = 2 CHD risk factors. Marland Kitchen LDL-C is now calculated using the Martin-Hopkins  calculation, which is a validated novel method providing  better accuracy than the Friedewald equation in the  estimation of LDL-C.  Cresenciano Genre et al. Annamaria Helling. 0626;948(54): 2061-2068  (http://education.[facility].com/faq/FAQ164)    HDL  Date Value Ref Range Status  06/17/2022 58 > OR = 50 mg/dL Final   Triglycerides  Date Value Ref Range Status  06/17/2022 129 <150 mg/dL Final         Failed - CMP within normal limits and completed in the last 12 months    Albumin  Date Value Ref Range Status  11/07/2017 4.6 3.6 - 4.8 g/dL Final   Alkaline Phosphatase  Date Value Ref Range Status  11/07/2017 122 (H) 39 - 117 IU/L Final   Alkaline phosphatase (APISO)  Date Value Ref Range Status  03/12/2022 137 37 - 153 U/L Final   ALT  Date Value Ref Range Status  03/12/2022 9 6 - 29 U/L Final   AST  Date Value Ref Range Status  03/12/2022 12 10 - 35 U/L Final   BUN  Date Value Ref Range Status  03/12/2022 13 7 - 25 mg/dL Final  08/13/2020 14 8 - 27 mg/dL Final   Calcium  Date Value Ref Range Status  06/11/2022 10.6 (H) 8.7 - 10.3 mg/dL Final   CO2  Date Value Ref Range Status  03/12/2022 23 20 - 32 mmol/L Final   Creat  Date Value Ref Range Status  03/12/2022 0.84 0.60 - 1.00 mg/dL Final   Glucose, Bld  Date Value Ref  Range Status  03/12/2022 96 65 - 99 mg/dL Final    Comment:    .            Fasting reference interval .    Potassium  Date Value Ref Range Status  03/12/2022 4.6 3.5 - 5.3 mmol/L Final   Sodium  Date Value Ref Range Status  03/12/2022 141 135 - 146 mmol/L Final  08/13/2020 141 134 - 144 mmol/L Final   Total Bilirubin  Date Value Ref Range Status  03/12/2022 0.4 0.2 - 1.2 mg/dL Final   Bilirubin Total  Date Value Ref Range Status  11/07/2017 0.4 0.0 - 1.2 mg/dL Final   Protein, ur  Date Value Ref Range Status  04/01/2020 1+ (A) NEGATIVE Final   Total Protein  Date Value Ref Range Status  03/12/2022 6.6 6.1 - 8.1 g/dL Final  11/07/2017 6.9 6.0 - 8.5 g/dL Final   GFR, Est African American  Date Value Ref Range Status  11/04/2020 70 > OR = 60 mL/min/1.46m  Final   eGFR  Date Value Ref Range Status  03/12/2022 75 > OR = 60 mL/min/1.92m Final    Comment:    The eGFR is based on the CKD-EPI 2021 equation. To calculate  the new eGFR from a previous Creatinine or Cystatin C result, go to https://www.kidney.org/professionals/ kdoqi/gfr%5Fcalculator    GFR, Est Non African American  Date Value Ref Range Status  11/04/2020 60 > OR = 60 mL/min/1.781mFinal         Passed - TSH in normal range and within 360 days    TSH  Date Value Ref Range Status  06/11/2022 2.360 0.450 - 4.500 uIU/mL Final         Passed - Completed PHQ-2 or PHQ-9 in the last 360 days      Passed - Last Heart Rate in normal range    Pulse Readings from Last 1 Encounters:  08/11/22 96         Passed - Valid encounter within last 6 months    Recent Outpatient Visits           5 months ago Change in stool   BrCrestWaCammie McgeeMD   6 months ago Hypercalcemia   BrHarveyiDennard SchaumannWaCammie McgeeMD   8 months ago Primary hypertension   BrStokesiDennard SchaumannWaCammie McgeeMD   1 year ago MiStoryPickard, WaCammie McgeeMD   1 year ago Nausea   BrLinnickard, WaCammie McgeeMD       Future Appointments             In 2 months CoElgie CollardPA-C [facility]            Passed - CBC within normal limits and completed in the last 12 months    WBC  Date Value Ref Range Status  03/12/2022 7.9 3.8 - 10.8 Thousand/uL Final   RBC  Date Value Ref Range Status  03/12/2022 4.84 3.80 - 5.10 Million/uL Final   Hemoglobin  Date Value Ref Range Status  03/12/2022 12.9 11.7 - 15.5 g/dL Final   HCT  Date Value Ref Range Status  03/12/2022 40.6 35.0 - 45.0 % Final   MCHC  Date Value Ref Range Status  03/12/2022 31.8 (L) 32.0 - 36.0 g/dL Final   MCEndoscopy Center LLCDate Value Ref Range Status  03/12/2022 26.7 (L) 27.0 - 33.0 pg Final   MCV  Date Value Ref Range Status  03/12/2022 83.9 80.0 - 100.0 fL Final   No results found for: "PLTCOUNTKUC", "LABPLAT", "POCPLA" RDW  Date Value Ref Range Status  03/12/2022 14.6 11.0 - 15.0 % Final

## 2022-08-24 ENCOUNTER — Telehealth: Payer: Self-pay | Admitting: *Deleted

## 2022-08-24 ENCOUNTER — Encounter (HOSPITAL_COMMUNITY): Payer: Medicare HMO

## 2022-08-24 NOTE — Telephone Encounter (Signed)
Patient will forego 24 hour ambulatory blood pressure monitor and continue to monitor her blood pressure at home.

## 2022-08-24 NOTE — Telephone Encounter (Signed)
Heiley, Shaikh - 08/24/2022 12:32 PM Freada Bergeron, MD  Sent: Tue August 24, 2022 12:55 PM  To: Nuala Alpha, LPN         Message  If she is able to take her blood pressure reliably with her BP cuff, I think we can hold off on the ambulatory monitor.

## 2022-08-24 NOTE — Telephone Encounter (Signed)
Dr. Johney Frame, please advise if pt needs to pursue getting a 24 hr BP monitor that was ordered by her PCP months ago.  She is only going to pursue this if you feel it is absolutely necessary.   Pt does monitor BP at home.   Please advise!

## 2022-08-24 NOTE — Telephone Encounter (Signed)
Back in September, Dr. Dennard Schaumann had ordered a 24 hour ambulatory blood pressure monitor.   Due to a 2-3 month wait for an available appointment, other measures were discussed for the patient to monitor her blood pressure. Contacted patient today to see if she would still like to pursue the 24 hour ambulatory blood pressure monitor through our office.  Patient responded she would schedule the 24 hour blood pressure monitor if Dr. Johney Frame thought it was necessary.  Message forwarded to Dr. Johney Frame for advisement.

## 2022-08-30 ENCOUNTER — Other Ambulatory Visit: Payer: Self-pay | Admitting: Family Medicine

## 2022-08-30 DIAGNOSIS — F333 Major depressive disorder, recurrent, severe with psychotic symptoms: Secondary | ICD-10-CM

## 2022-09-05 ENCOUNTER — Encounter: Payer: Self-pay | Admitting: Family Medicine

## 2022-09-06 ENCOUNTER — Other Ambulatory Visit: Payer: Self-pay | Admitting: Family Medicine

## 2022-09-06 MED ORDER — SERTRALINE HCL 50 MG PO TABS: 50.0000 mg | ORAL_TABLET | Freq: Every day | ORAL | 3 refills | Status: DC

## 2022-09-15 DIAGNOSIS — E212 Other hyperparathyroidism: Secondary | ICD-10-CM | POA: Diagnosis not present

## 2022-09-15 DIAGNOSIS — E782 Mixed hyperlipidemia: Secondary | ICD-10-CM | POA: Diagnosis not present

## 2022-09-16 LAB — LIPID PANEL
Chol/HDL Ratio: 4.5 ratio — ABNORMAL HIGH (ref 0.0–4.4)
Cholesterol, Total: 236 mg/dL — ABNORMAL HIGH (ref 100–199)
HDL: 53 mg/dL (ref >39)
LDL Chol Calc (NIH): 160 mg/dL — ABNORMAL HIGH (ref 0–99)
Triglycerides: 129 mg/dL (ref 0–149)
VLDL Cholesterol Cal: 23 mg/dL (ref 5–40)

## 2022-09-16 LAB — PTH, INTACT AND CALCIUM
Calcium: 10.8 mg/dL — ABNORMAL HIGH (ref 8.7–10.3)
PTH: 120 pg/mL — ABNORMAL HIGH (ref 15–65)

## 2022-09-18 ENCOUNTER — Encounter: Payer: Self-pay | Admitting: Cardiology

## 2022-09-20 ENCOUNTER — Ambulatory Visit: Payer: Medicare HMO | Admitting: "Endocrinology

## 2022-09-28 DIAGNOSIS — H33301 Unspecified retinal break, right eye: Secondary | ICD-10-CM | POA: Diagnosis not present

## 2022-09-28 DIAGNOSIS — H2513 Age-related nuclear cataract, bilateral: Secondary | ICD-10-CM | POA: Diagnosis not present

## 2022-09-28 DIAGNOSIS — H353130 Nonexudative age-related macular degeneration, bilateral, stage unspecified: Secondary | ICD-10-CM | POA: Diagnosis not present

## 2022-09-30 ENCOUNTER — Ambulatory Visit: Payer: Medicare HMO | Admitting: "Endocrinology

## 2022-10-29 ENCOUNTER — Encounter: Payer: Self-pay | Admitting: "Endocrinology

## 2022-10-29 ENCOUNTER — Ambulatory Visit: Payer: Medicare HMO | Admitting: "Endocrinology

## 2022-10-29 VITALS — BP 116/70 | HR 72 | Ht 70.0 in | Wt 240.6 lb

## 2022-10-29 DIAGNOSIS — E782 Mixed hyperlipidemia: Secondary | ICD-10-CM

## 2022-10-29 DIAGNOSIS — E212 Other hyperparathyroidism: Secondary | ICD-10-CM

## 2022-10-29 NOTE — Progress Notes (Signed)
10/29/2022, 1:49 PM  Endocrinology follow-up note  Melanie Hurst is a 72 y.o.-year-old female, referred by her  [name], MD  , for evaluation for hypercalcemia/hyperparathyroidism.   Past Medical History:  Diagnosis Date   Anemia    Anxiety    Chronic fatigue syndrome with fibromyalgia    Depression    Fibromyalgia    GERD (gastroesophageal reflux disease)    Hypertension    IBS (irritable bowel syndrome)    Obesity    Scoliosis    Sleep apnea    cpap at 7 cm   Tremor, essential     Past Surgical History:  Procedure Laterality Date   COLONOSCOPY  10/2021   ESOPHAGOGASTRODUODENOSCOPY  10/2021   PCOS surgery  1971   TONSILECTOMY, ADENOIDECTOMY, BILATERAL MYRINGOTOMY AND TUBES  1962    Social History   Tobacco Use   Smoking status: Former    Years: 6.00    Types: Cigarettes    Quit date: 10/11/2009    Years since quitting: 13.0   Smokeless tobacco: Never  Vaping Use   Vaping Use: Former   Devices: vaped to stop smoking  Substance Use Topics   Alcohol use: Yes    Comment: Occasional wine   Drug use: No    Family History  Problem Relation Age of Onset   Hypertension Mother    Melanoma Mother        wrist   Other Mother        brain tumor   Stroke Mother    Hyperlipidemia Mother    Cancer Father    Hyperlipidemia Father    Hypertension Father    Colon polyps Father        not cancerous   Kidney failure Father    Breast cancer Sister    Stomach cancer Maternal Uncle    Breast cancer Paternal Aunt    Colon cancer Paternal Aunt    Irritable bowel syndrome Paternal Aunt    Esophageal cancer Neg Hx    Rectal cancer Neg Hx     Outpatient Encounter Medications as of 10/29/2022  Medication Sig   AMBULATORY NON FORMULARY MEDICATION Medication Name: Fesol complete (Iron) one tablet by mouth once daily   ARIPiprazole (ABILIFY) 5 MG tablet Take 1 tablet (5 mg total) by mouth daily.   B  Complex Vitamins (VITAMIN B COMPLEX PO) Take 1 tablet by mouth daily.   diltiazem (CARDIZEM) 30 MG tablet Take 1 tablet (30 mg total) by mouth 2 (two) times daily.   latanoprost (XALATAN) 0.005 % ophthalmic solution SMARTSIG:In Eye(s)   losartan (COZAAR) 50 MG tablet Take 1 tablet (50 mg total) by mouth 2 (two) times daily.   Methylsulfonylmethane (MSM PO) Take 1 tablet by mouth daily.   modafinil (PROVIGIL) 200 MG tablet Take 1 tablet (200 mg total) by mouth in the morning and at bedtime.   Multiple Vitamin (MULTIVITAMIN ADULT PO) Take 1 tablet by mouth daily.   Multiple Vitamins-Minerals (PRESERVISION AREDS 2 PO) Take by mouth daily.   nystatin (MYCOSTATIN) 100000 UNIT/ML suspension Take 5 mLs (500,000 Units total) by mouth 4 (four) times daily.   ondansetron (ZOFRAN-ODT)  4 MG disintegrating tablet Take 1 tablet every 4-6 hours as needed (Patient not taking: Reported on 08/11/2022)   rosuvastatin (CRESTOR) 10 MG tablet Take 1 tablet (10 mg total) by mouth daily. (Patient not taking: Reported on 10/29/2022)   sertraline (ZOLOFT) 50 MG tablet Take 1 tablet (50 mg total) by mouth daily. Stop effexor   Vitamin D, Ergocalciferol, (DRISDOL) 1.25 MG (50000 UNIT) CAPS capsule Take 1 capsule (50,000 Units total) by mouth every 7 (seven) days.   VITAMIN E PO Take 1 tablet by mouth daily.   VITAMIN K PO Take 1 tablet by mouth daily.   ZINC OXIDE PO Take 1 tablet by mouth daily.   No facility-administered encounter medications on file as of 10/29/2022.    Allergies  Allergen Reactions   Asa [Aspirin] Hives   Codeine     Vomit      HPI  Cythnia Osmun was known to have hypercalcemia at least from 2021.  Review of her medical records show she did have at least 3 separate occasions where her calcium was above normal.   Patient has was seen with indicating primary hyperparathyroidism.  She was sent for more work-up including repeat PTH/calcium and bone density.   She returns with no new symptoms or  complaints.  Her repeat labs show calcium still elevated ca at 10.8 associated with higher PTH of 126.  Her bone density is negative for osteopenia/osteoporosis.  No prior history of fragility fractures or falls. No history of  kidney stones.  No history of CKD. Last BUN/Cr: 18/1  she is not on HCTZ or other thiazide therapy.  She has profound vitamin D deficiency at 54, not on vitamin D supplement.   she is not on calcium supplements,  she eats dairy and green, leafy, vegetables on average amounts.  she does not have a family history of hypercalcemia, pituitary tumors, thyroid cancer, or osteoporosis.   I reviewed her chart and she also has a history of hypertension, obesity.    ROS:  Constitutional: + Fluctuating body weight, + no fatigue, no subjective hyperthermia, no subjective hypothermia Eyes: no blurry vision, no xerophthalmia ENT: no sore throat, no nodules palpated in throat, no dysphagia/odynophagia, no hoarseness  PE: BP 116/70   Pulse 72   Ht '5\' 10"'$  (1.778 m)   Wt 240 lb 9.6 oz (109.1 kg)   BMI 34.52 kg/m , Body mass index is 34.52 kg/m. Wt Readings from Last 3 Encounters:  10/29/22 240 lb 9.6 oz (109.1 kg)  08/11/22 248 lb (112.5 kg)  06/21/22 254 lb 12.8 oz (115.6 kg)    Constitutional: + BMI of 36.56- not in acute distress, normal state of mind Eyes: PERRLA, EOMI, no exophthalmos ENT: moist mucous membranes, no gross thyromegaly, no gross cervical lymphadenopathy  Gastrointestinal: abdomen soft, Non -tender, No distension, Bowel Sounds present Musculoskeletal: + Scoliosis, no gross deformities, strength intact in all four extremities Skin: moist, warm, no rashes   CMP ( most recent) CMP     Component Value Date/Time   NA 141 03/12/2022 1511   NA 141 08/13/2020 1400   K 4.6 03/12/2022 1511   CL 104 03/12/2022 1511   CO2 23 03/12/2022 1511   GLUCOSE 96 03/12/2022 1511   BUN 13 03/12/2022 1511   BUN 14 08/13/2020 1400   CREATININE 0.84 03/12/2022  1511   CALCIUM 10.8 (H) 09/15/2022 1332   PROT 6.6 03/12/2022 1511   PROT 6.9 11/07/2017 1419   ALBUMIN 4.6 11/07/2017 1419   AST  12 03/12/2022 1511   ALT 9 03/12/2022 1511   ALKPHOS 122 (H) 11/07/2017 1419   BILITOT 0.4 03/12/2022 1511   BILITOT 0.4 11/07/2017 1419   GFRNONAA 60 11/04/2020 1437   GFRAA 70 11/04/2020 1437      Lipid Panel ( most recent) Lipid Panel     Component Value Date/Time   CHOL 236 (H) 09/15/2022 1332   TRIG 129 09/15/2022 1332   HDL 53 09/15/2022 1332   CHOLHDL 4.5 (H) 09/15/2022 1332   CHOLHDL 3.9 06/17/2022 1101   VLDL 48 (H) 08/02/2016 1455   LDLCALC 160 (H) 09/15/2022 1332   LDLCALC 146 (H) 06/17/2022 1101   LABVLDL 23 09/15/2022 1332      Lab Results  Component Value Date   TSH 2.360 06/11/2022   TSH 1.96 04/19/2017   TSH 2.42 06/11/2016   FREET4 1.00 06/11/2022     Latest Reference Range & Units Most Recent  Glucose 65 - 99 mg/dL 96 03/12/22 15:11  PTH, Intact 15 - 65 pg/mL 86 (H) 06/11/22 13:54  PTH-related peptide pmol/L <2.0 06/11/22 13:54  PTH Interp  Comment 06/11/22 13:54  TSH 0.450 - 4.500 uIU/mL 2.360 06/11/22 13:54  T4,Free(Direct) 0.82 - 1.77 ng/dL 1.00 06/11/22 13:54  (H): Data is abnormally high  Assessment: 1. Hypercalcemia / Hyperparathyroidism 2.  Hyperlipidemia  Plan: Patient has had several instances of elevated calcium, with the highest level being at 11 mg/dL.  Most recently, her calcium is higher at 10.8 and PTH higher at 126.  She is on vitamin D prescription and advised to continue.  - No apparent complications from hypercalcemia/hyperparathyroidism: no history of  nephrolithiasis,  osteoporosis,fragility fractures. No abdominal pain, no major mood disorders, no bone pain.  - I discussed with the patient about the physiology of calcium and parathyroid hormone, and possible  effects of  increased PTH/ Calcium , including kidney stones, cardiac dysrhythmias, osteoporosis, abdominal pain, etc.   - She will need  intervention likely with sensipar rather than surgery. In preparation, she will have 24 hour urine calcium.   I discussed her bone density results which are negative. Her major health risk seems to be her worsening  dyslipidemia.  She is a good candidate for lifestyle medicine. She did not tolerate her crestor. - she acknowledges that there is a room for improvement in her food and drink choices. - Suggestion is made for her to avoid simple carbohydrates  from her diet including Cakes, Sweet Desserts, Ice Cream, Soda (diet and regular), Sweet Tea, Candies, Chips, Cookies, Store Bought Juices, Alcohol , Artificial Sweeteners,  Coffee Creamer, and "Sugar-free" Products, Lemonade. This will help patient to have more stable blood glucose profile and potentially avoid unintended weight gain.  The following Lifestyle Medicine recommendations according to [facility]) were discussed and and offered to patient and she  agrees to start the journey:  A. Whole Foods, Plant-Based Nutrition comprising of fruits and vegetables, plant-based proteins, whole-grain carbohydrates was discussed in detail with the patient.   A list for source of those nutrients were also provided to the patient.  Patient will use only water or unsweetened tea for hydration. B.  The need to stay away from risky substances including alcohol, smoking; obtaining 7 to 9 hours of restorative sleep, at least 150 minutes of moderate intensity exercise weekly, the importance of healthy social connections,  and stress management techniques were discussed. C.  A full color page of  Calorie density of various food  groups per pound showing examples of each food groups was provided to the patient.    She is advised to maintain close follow-up with her PMD.  I spent  26 minutes in the care of the patient today including review of labs from Thyroid Function, CMP, and other relevant labs ; imaging/biopsy records  (current and previous including abstractions from other facilities); face-to-face time discussing  her lab results and symptoms, medications doses, her options of short and long term treatment based on the latest standards of care / guidelines;   and documenting the encounter.  Laureen Ochs  participated in the discussions, expressed understanding, and voiced agreement with the above plans.  All questions were answered to her satisfaction. she is encouraged to contact clinic should she have any questions or concerns prior to her return visit.   - Return in about 2 weeks (around 11/12/2022) for [place], MD [facility] [address] [place], [place] [address] Phone: [phone]  Fax: [phone]    This note was partially dictated with voice recognition software. Similar sounding words can be transcribed inadequately or may not  be corrected upon review.  10/29/2022, 1:49 PM

## 2022-11-02 ENCOUNTER — Ambulatory Visit: Payer: Medicare HMO | Admitting: "Endocrinology

## 2022-11-09 ENCOUNTER — Telehealth: Payer: Self-pay | Admitting: "Endocrinology

## 2022-11-09 NOTE — Telephone Encounter (Signed)
Pt said she has already  half way filled the urine for the 24 hour urine test. She is wondering bc she will not get a full 24 hour in the bottle. She is wondering if she can get 2 more bottles. Please advise.

## 2022-11-09 NOTE — Telephone Encounter (Signed)
Patient made aware.

## 2022-11-09 NOTE — Telephone Encounter (Signed)
Pt said that [facility] is out of bottles. She will check with another [facility] and let us know if this can't be done

## 2022-11-10 DIAGNOSIS — E212 Other hyperparathyroidism: Secondary | ICD-10-CM | POA: Diagnosis not present

## 2022-11-12 LAB — CREATININE, URINE, 24 HOUR
Creatinine, 24H Ur: 1133 mg/24 hr (ref 800–1800)
Creatinine, Urine: 35.4 mg/dL

## 2022-11-12 LAB — CALCIUM, URINE, 24 HOUR
Calcium, 24H Urine: 563 mg/24 hr — ABNORMAL HIGH (ref 0–320)
Calcium, Urine: 17.6 mg/dL

## 2022-11-15 ENCOUNTER — Ambulatory Visit: Payer: Medicare HMO | Admitting: "Endocrinology

## 2022-11-15 NOTE — Progress Notes (Unsigned)
Office Visit    Patient Name: Melanie Hurst Date of Encounter: 11/16/2022  PCP:  [name], MD   [place]  Cardiologist:  Freada Bergeron, MD  Advanced Practice Provider:  No care team member to display Electrophysiologist:  None   HPI    Melanie Hurst is a 72 y.o. female with past medical history significant for anemia, anxiety, depression, HTN and fibromyalgia presents today for follow-up appointment.  Initially seen in the clinic 07/2020 for palpitations and SOB on exertion.  Cardiac monitor 08/2020 with occasional PACs (4.2%), occasional PVCs (2.2%), 89 runs of SVT with longest lasting 50 minutes.  Myoview 09/2020 with normal LVEF and no ischemia/infarct.  Recommend metoprolol at that time.  She did not follow-up.  She was last seen 08/2022 by Dr. Johney Frame.  She was concerned about POTS.  She had episodes of lightheadedness, palpitations, and SOB especially with position changes as well as with activity.  Has been monitoring her blood pressure at home and notes that it is significantly decreased with standing but is not sure the numbers.  She also states her heart rate goes up significantly.  She remains hydrated throughout the day but does not usually eat breakfast as it makes her tired.  No chest pain or syncope.  Has trace lower extremity edema which improves with leg elevation.  Blood pressure has been running in the 130s over 80s at home.  Amlodipine was stopped due to concern of low blood pressure.  Orthostatics done with significant increased heart rate but no change in blood pressure with positional change.  Today, she tells me that she symptoms with prolonged standing she states she was sitting down frequently to rest or she becomes very short of breath and shaky then becomes nauseous.  She has been taking the Cardizem 30 mg daily at night.  She did not realize that it was a twice daily medication.  She was recently diagnosed with metabolic  syndrome.  She also sees an endocrinologist who did a 24-hour urine test.  She tells me she usually drinks 8-9 bottles of water a day and she stays thirsty.  She also started incorporating some electrolyte drinks as well.  She does wear compression socks.  States they are hard to get on and off.  We discussed elastic therapy in [place].  Reports no dyspnea on exertion. Reports no chest pain, pressure, or tightness.  Past Medical History    Past Medical History:  Diagnosis Date   Anemia    Anxiety    Chronic fatigue syndrome with fibromyalgia    Depression    Fibromyalgia    GERD (gastroesophageal reflux disease)    Hypertension    IBS (irritable bowel syndrome)    Obesity    Scoliosis    Sleep apnea    cpap at 7 cm   Tremor, essential    Past Surgical History:  Procedure Laterality Date   COLONOSCOPY  10/2021   ESOPHAGOGASTRODUODENOSCOPY  10/2021   PCOS surgery  1971   TONSILECTOMY, ADENOIDECTOMY, BILATERAL MYRINGOTOMY AND TUBES  1962    Allergies  Allergies  Allergen Reactions   Asa [Aspirin] Hives   Codeine     Vomit      EKGs/Labs/Other Studies Reviewed:   The following studies were reviewed today:  Myoview 09/2020: The left ventricular ejection fraction is normal (55-65%). There was no ST segment deviation noted during stress. The study is normal. This is a low risk study.   Normal  pharmacologic nuclear study with no evidence for prior infarct or ischemia. Normal LVEF.  Cardiac Monitor 08/2020: Over the course of the 12 days that the patient wore the monitor, the predominant rhythm was sinus rhythm with average HR 82bpm There were 89 runs of SVT with the longest lasting 50 minutes with average HR 131 There was one 6 beat run of NSVT with HR 169bpm There were occasional PACs (4.2%, 59718); occasional PVCs (2.2%m 31523) No AFib, sustained VT or pauses  EKG:  EKG is no ordered today.    Recent Labs: 03/12/2022: ALT 9; BUN 13; Creat 0.84; Hemoglobin 12.9;  Platelets 409; Potassium 4.6; Sodium 141 06/11/2022: Magnesium 2.3; TSH 2.360  Recent Lipid Panel    Component Value Date/Time   CHOL 236 (H) 09/15/2022 1332   TRIG 129 09/15/2022 1332   HDL 53 09/15/2022 1332   CHOLHDL 4.5 (H) 09/15/2022 1332   CHOLHDL 3.9 06/17/2022 1101   VLDL 48 (H) 08/02/2016 1455   LDLCALC 160 (H) 09/15/2022 1332   LDLCALC 146 (H) 06/17/2022 1101    Home Medications   Current Meds  Medication Sig   AMBULATORY NON FORMULARY MEDICATION Medication Name: Fesol complete (Iron) one tablet by mouth once daily   ARIPiprazole (ABILIFY) 5 MG tablet Take 1 tablet (5 mg total) by mouth daily.   B Complex Vitamins (VITAMIN B COMPLEX PO) Take 1 tablet by mouth daily.   ezetimibe (ZETIA) 10 MG tablet Take 1 tablet (10 mg total) by mouth daily.   Methylsulfonylmethane (MSM PO) Take 1 tablet by mouth daily.   modafinil (PROVIGIL) 200 MG tablet Take 1 tablet (200 mg total) by mouth in the morning and at bedtime.   Multiple Vitamin (MULTIVITAMIN ADULT PO) Take 1 tablet by mouth daily.   Multiple Vitamins-Minerals (PRESERVISION AREDS 2 PO) Take by mouth daily.   RHOPRESSA 0.02 % SOLN Apply to eye.   sertraline (ZOLOFT) 50 MG tablet Take 1 tablet (50 mg total) by mouth daily. Stop effexor   Vitamin D, Ergocalciferol, (DRISDOL) 1.25 MG (50000 UNIT) CAPS capsule Take 1 capsule (50,000 Units total) by mouth every 7 (seven) days.   VITAMIN E PO Take 1 tablet by mouth daily.   VITAMIN K PO Take 1 tablet by mouth daily.   ZINC OXIDE PO Take 1 tablet by mouth daily.   [DISCONTINUED] diltiazem (CARDIZEM) 30 MG tablet Take 1 tablet (30 mg total) by mouth 2 (two) times daily. (Patient taking differently: Take 30 mg by mouth daily in the afternoon.)     Review of Systems      All other systems reviewed and are otherwise negative except as noted above.  Physical Exam    VS:  BP 138/82   Pulse 70   Ht '5\' 10"'$  (1.778 m)   Wt 239 lb (108.4 kg)   SpO2 99%   BMI 34.29 kg/m  , BMI Body  mass index is 34.29 kg/m.  Wt Readings from Last 3 Encounters:  11/16/22 239 lb (108.4 kg)  10/29/22 240 lb 9.6 oz (109.1 kg)  08/11/22 248 lb (112.5 kg)     GEN: Well nourished, well developed, in no acute distress. HEENT: normal. Neck: Supple, no JVD, carotid bruits, or masses. Cardiac: RRR, no murmurs, rubs, or gallops. No clubbing, cyanosis, edema.  Radials/PT 2+ and equal bilaterally.  Respiratory:  Respirations regular and unlabored, clear to auscultation bilaterally. GI: Soft, nontender, nondistended. MS: No deformity or atrophy. Skin: Warm and dry, no rash. Neuro:  Strength and sensation are intact.  Psych: Normal affect.  Assessment & Plan    POTs? -started on Cardizem and interested in the ER dosing to just take it at night however, we cannot split a '120mg'$  tablet of the ER formula -continue Cardizem '30mg'$  BID -continue hydration with electrolyte beverage -continue compression hose -continue to monitor BP closely  SOB/SVT/palpitations -She is not interested in starting a BB due to fear of dementia -significant and occurs every time she is standing for long periods of time -associated tachycardia  -continue current medications  Primary hypertension -off cozaar and amlodipine -continue Cozaar -monitor BP at home    HLD -off Crestor due to side effects -Start Zetia and plan for follow-up Lipid panel in 6-8 weeks      Disposition: Follow up 3-4 months  with Freada Bergeron, MD or APP.  Signed, Elgie Collard, PA-C 11/16/2022, 2:28 PM [facility]

## 2022-11-16 ENCOUNTER — Encounter: Payer: Self-pay | Admitting: Physician Assistant

## 2022-11-16 ENCOUNTER — Ambulatory Visit: Payer: Medicare HMO | Attending: Physician Assistant | Admitting: Physician Assistant

## 2022-11-16 VITALS — BP 138/82 | HR 70 | Ht 70.0 in | Wt 239.0 lb

## 2022-11-16 DIAGNOSIS — I471 Supraventricular tachycardia, unspecified: Secondary | ICD-10-CM

## 2022-11-16 DIAGNOSIS — R002 Palpitations: Secondary | ICD-10-CM | POA: Diagnosis not present

## 2022-11-16 DIAGNOSIS — E782 Mixed hyperlipidemia: Secondary | ICD-10-CM | POA: Diagnosis not present

## 2022-11-16 DIAGNOSIS — R0602 Shortness of breath: Secondary | ICD-10-CM | POA: Diagnosis not present

## 2022-11-16 DIAGNOSIS — I10 Essential (primary) hypertension: Secondary | ICD-10-CM | POA: Diagnosis not present

## 2022-11-16 MED ORDER — DILTIAZEM HCL 30 MG PO TABS: 30.0000 mg | ORAL_TABLET | Freq: Two times a day (BID) | ORAL | 3 refills | Status: DC

## 2022-11-16 MED ORDER — EZETIMIBE 10 MG PO TABS: 10.0000 mg | ORAL_TABLET | Freq: Every day | ORAL | 3 refills | Status: DC

## 2022-11-16 NOTE — Patient Instructions (Signed)
Medication Instructions:  1.Start ezetimibe (Zetia) 10 mg daily *If you need a refill on your cardiac medications before your next appointment, please call your pharmacy*   Lab Work: Fasting lipid panel in 6-8 weeks If you have labs (blood work) drawn today and your tests are completely normal, you will receive your results only by: [place] (if you have MyChart) OR A paper copy in the mail If you have any lab test that is abnormal or we need to change your treatment, we will call you to review the results.   Follow-Up: At [facility], you and your health needs are our priority.  As part of our continuing mission to provide you with exceptional heart care, we have created designated Provider Care Teams.  These Care Teams include your primary Cardiologist (physician) and Advanced Practice Providers (APPs -  Physician Assistants and Nurse Practitioners) who all work together to provide you with the care you need, when you need it.   Your next appointment:   3-4 month(s)  Provider:   Freada Bergeron, MD

## 2022-11-26 ENCOUNTER — Encounter: Payer: Self-pay | Admitting: "Endocrinology

## 2022-11-26 ENCOUNTER — Ambulatory Visit: Payer: Medicare HMO | Admitting: "Endocrinology

## 2022-11-26 VITALS — BP 138/76 | HR 72 | Ht 70.0 in | Wt 240.6 lb

## 2022-11-26 DIAGNOSIS — E212 Other hyperparathyroidism: Secondary | ICD-10-CM

## 2022-11-26 DIAGNOSIS — E782 Mixed hyperlipidemia: Secondary | ICD-10-CM | POA: Diagnosis not present

## 2022-11-26 NOTE — Progress Notes (Signed)
11/26/2022, 1:26 PM  Endocrinology follow-up note  Melanie Hurst is a 72 y.o.-year-old female, referred by her  [name], MD  , for evaluation for hypercalcemia/hyperparathyroidism.   Past Medical History:  Diagnosis Date   Anemia    Anxiety    Chronic fatigue syndrome with fibromyalgia    Depression    Fibromyalgia    GERD (gastroesophageal reflux disease)    Hypertension    IBS (irritable bowel syndrome)    Obesity    Scoliosis    Sleep apnea    cpap at 7 cm   Tremor, essential     Past Surgical History:  Procedure Laterality Date   COLONOSCOPY  10/2021   ESOPHAGOGASTRODUODENOSCOPY  10/2021   PCOS surgery  1971   TONSILECTOMY, ADENOIDECTOMY, BILATERAL MYRINGOTOMY AND TUBES  1962    Social History   Tobacco Use   Smoking status: Former    Years: 6.00    Types: Cigarettes    Quit date: 10/11/2009    Years since quitting: 13.1   Smokeless tobacco: Never  Vaping Use   Vaping Use: Former   Devices: vaped to stop smoking  Substance Use Topics   Alcohol use: Yes    Comment: Occasional wine   Drug use: No    Family History  Problem Relation Age of Onset   Hypertension Mother    Melanoma Mother        wrist   Other Mother        brain tumor   Stroke Mother    Hyperlipidemia Mother    Cancer Father    Hyperlipidemia Father    Hypertension Father    Colon polyps Father        not cancerous   Kidney failure Father    Breast cancer Sister    Stomach cancer Maternal Uncle    Breast cancer Paternal Aunt    Colon cancer Paternal Aunt    Irritable bowel syndrome Paternal Aunt    Esophageal cancer Neg Hx    Rectal cancer Neg Hx     Outpatient Encounter Medications as of 11/26/2022  Medication Sig   AMBULATORY NON FORMULARY MEDICATION Medication Name: Fesol complete (Iron) one tablet by mouth once daily   ARIPiprazole (ABILIFY) 5 MG tablet Take 1 tablet (5 mg total) by mouth daily.   B  Complex Vitamins (VITAMIN B COMPLEX PO) Take 1 tablet by mouth daily.   diltiazem (CARDIZEM) 30 MG tablet Take 1 tablet (30 mg total) by mouth 2 (two) times daily.   ezetimibe (ZETIA) 10 MG tablet Take 1 tablet (10 mg total) by mouth daily.   Methylsulfonylmethane (MSM PO) Take 1 tablet by mouth daily.   modafinil (PROVIGIL) 200 MG tablet Take 1 tablet (200 mg total) by mouth in the morning and at bedtime.   Multiple Vitamin (MULTIVITAMIN ADULT PO) Take 1 tablet by mouth daily.   Multiple Vitamins-Minerals (PRESERVISION AREDS 2 PO) Take by mouth daily.   RHOPRESSA 0.02 % SOLN Apply to eye.   sertraline (ZOLOFT) 50 MG tablet Take 1 tablet (50 mg total) by mouth daily. Stop effexor   Vitamin D, Ergocalciferol, (DRISDOL) 1.25 MG (50000  UNIT) CAPS capsule Take 1 capsule (50,000 Units total) by mouth every 7 (seven) days.   VITAMIN E PO Take 1 tablet by mouth daily.   VITAMIN K PO Take 1 tablet by mouth daily.   ZINC OXIDE PO Take 1 tablet by mouth daily.   [DISCONTINUED] latanoprost (XALATAN) 0.005 % ophthalmic solution SMARTSIG:In Eye(s) (Patient not taking: Reported on 11/16/2022)   [DISCONTINUED] losartan (COZAAR) 50 MG tablet Take 1 tablet (50 mg total) by mouth 2 (two) times daily. (Patient not taking: Reported on 11/16/2022)   [DISCONTINUED] nystatin (MYCOSTATIN) 100000 UNIT/ML suspension Take 5 mLs (500,000 Units total) by mouth 4 (four) times daily. (Patient not taking: Reported on 11/16/2022)   [DISCONTINUED] ondansetron (ZOFRAN-ODT) 4 MG disintegrating tablet Take 1 tablet every 4-6 hours as needed (Patient not taking: Reported on 08/11/2022)   [DISCONTINUED] rosuvastatin (CRESTOR) 10 MG tablet Take 1 tablet (10 mg total) by mouth daily. (Patient not taking: Reported on 10/29/2022)   No facility-administered encounter medications on file as of 11/26/2022.    Allergies  Allergen Reactions   Asa [Aspirin] Hives   Codeine     Vomit      HPI  Melanie Hurst was known to have hypercalcemia at  least from 2021.  Review of her medical records show she did have at least 3 separate occasions where her calcium was above normal.   Patient has was seen with indicating primary hyperparathyroidism.  She was sent for more work-up including repeat PTH/calcium and bone density.   She returns with no new symptoms or complaints.  Her repeat labs show calcium still elevated ca at 10.8 associated with higher PTH of 126.  Her previsit labs are still showing persistent hypercalcemia at 10.8, PTH still elevated at 120 and 24-hour urine calcium significant elevated at 563.  Her bone density is negative for osteopenia/osteoporosis.  No prior history of fragility fractures or falls. No history of  kidney stones.  No history of CKD. Last BUN/Cr: 18/1  she is not on HCTZ or other thiazide therapy.  She has profound vitamin D deficiency at 19, not on vitamin D supplement.   she is not on calcium supplements,  she eats dairy and green, leafy, vegetables on average amounts.  she does not have a family history of hypercalcemia, pituitary tumors, thyroid cancer, or osteoporosis.   I reviewed her chart and she also has a history of hypertension, obesity.    ROS:  Constitutional: + Fluctuating body weight, + no fatigue, no subjective hyperthermia, no subjective hypothermia Eyes: no blurry vision, no xerophthalmia ENT: no sore throat, no nodules palpated in throat, no dysphagia/odynophagia, no hoarseness  PE: BP 138/76   Pulse 72   Ht 5' 10"$  (1.778 m)   Wt 240 lb 9.6 oz (109.1 kg)   BMI 34.52 kg/m , Body mass index is 34.52 kg/m. Wt Readings from Last 3 Encounters:  11/26/22 240 lb 9.6 oz (109.1 kg)  11/16/22 239 lb (108.4 kg)  10/29/22 240 lb 9.6 oz (109.1 kg)    Constitutional: + BMI of 34.5 to  not in acute distress, normal state of mind Eyes: PERRLA, EOMI, no exophthalmos ENT: moist mucous membranes, no gross thyromegaly, no gross cervical lymphadenopathy  Gastrointestinal: abdomen soft,  Non -tender, No distension, Bowel Sounds present Musculoskeletal: + Scoliosis, no gross deformities, strength intact in all four extremities Skin: moist, warm, no rashes   CMP ( most recent) CMP     Component Value Date/Time   NA 141 03/12/2022 1511  NA 141 08/13/2020 1400   K 4.6 03/12/2022 1511   CL 104 03/12/2022 1511   CO2 23 03/12/2022 1511   GLUCOSE 96 03/12/2022 1511   BUN 13 03/12/2022 1511   BUN 14 08/13/2020 1400   CREATININE 0.84 03/12/2022 1511   CALCIUM 10.8 (H) 09/15/2022 1332   PROT 6.6 03/12/2022 1511   PROT 6.9 11/07/2017 1419   ALBUMIN 4.6 11/07/2017 1419   AST 12 03/12/2022 1511   ALT 9 03/12/2022 1511   ALKPHOS 122 (H) 11/07/2017 1419   BILITOT 0.4 03/12/2022 1511   BILITOT 0.4 11/07/2017 1419   GFRNONAA 60 11/04/2020 1437   GFRAA 70 11/04/2020 1437      Lipid Panel ( most recent) Lipid Panel     Component Value Date/Time   CHOL 236 (H) 09/15/2022 1332   TRIG 129 09/15/2022 1332   HDL 53 09/15/2022 1332   CHOLHDL 4.5 (H) 09/15/2022 1332   CHOLHDL 3.9 06/17/2022 1101   VLDL 48 (H) 08/02/2016 1455   LDLCALC 160 (H) 09/15/2022 1332   LDLCALC 146 (H) 06/17/2022 1101   LABVLDL 23 09/15/2022 1332      Lab Results  Component Value Date   TSH 2.360 06/11/2022   TSH 1.96 04/19/2017   TSH 2.42 06/11/2016   FREET4 1.00 06/11/2022     Latest Reference Range & Units Most Recent  Glucose 65 - 99 mg/dL 96 03/12/22 15:11  PTH, Intact 15 - 65 pg/mL 86 (H) 06/11/22 13:54  PTH-related peptide pmol/L <2.0 06/11/22 13:54  PTH Interp  Comment 06/11/22 13:54  TSH 0.450 - 4.500 uIU/mL 2.360 06/11/22 13:54  T4,Free(Direct) 0.82 - 1.77 ng/dL 1.00 06/11/22 13:54    Latest Reference Range & Units 11/10/22 08:00  Creatinine, Urine Not Estab. mg/dL 35.4  Calcium, Urine Not Estab. mg/dL 17.6  Calcium, 24H Urine 0 - 320 mg/24 hr 563 (H)  Creatinine, 24H Ur 800 - 1,800 mg/24 hr 1,133  (H): Data is abnormally high   Assessment: 1. Hypercalcemia /  Hyperparathyroidism 2.  Hyperlipidemia 3.  Hypercalciuria 4.  Hyperlipidemia  Plan: Patient has had several instances of elevated calcium, with the highest level being at 11 mg/dL.  Most recently, her calcium is higher at 10.8 and PTH still high at 120.  Considering hypercalciuria at 563 mg / 24 hours, she needs a definitive treatment.    She is vitamin D replete at this time.   - No apparent complications from hypercalcemia/hyperparathyroidism: no history of  nephrolithiasis,  osteoporosis,fragility fractures. No abdominal pain, no major mood disorders, no bone pain.  - I discussed with the patient about the physiology of calcium and parathyroid hormone, and possible  effects of  increased PTH/ Calcium , including kidney stones, cardiac dysrhythmias, osteoporosis, abdominal pain, etc.   -Options of intervention were discussed with her.  She favors and accepts my recommendation of surgical treatment.  I will arrange for her to see Dr. Armandina Gemma for possible parathyroidectomy.   I discussed her bone density results which are negative. Her major health risk seems to be her worsening  dyslipidemia.  Remains a good candidate for lifestyle medicine.  She did not tolerate a prior attempt with Crestor. - she acknowledges that there is a room for improvement in her food and drink choices. - Suggestion is made for her to avoid simple carbohydrates  from her diet including Cakes, Sweet Desserts, Ice Cream, Soda (diet and regular), Sweet Tea, Candies, Chips, Cookies, Store Bought Juices, Alcohol , Artificial Sweeteners,  Coffee Creamer,  and "Sugar-free" Products, Lemonade. This will help patient to have more stable blood glucose profile and potentially avoid unintended weight gain.  The following Lifestyle Medicine recommendations according to [facility]) were discussed and and offered to patient and she  agrees to start the journey:  A. Whole Foods, Plant-Based  Nutrition comprising of fruits and vegetables, plant-based proteins, whole-grain carbohydrates was discussed in detail with the patient.   A list for source of those nutrients were also provided to the patient.  Patient will use only water or unsweetened tea for hydration. B.  The need to stay away from risky substances including alcohol, smoking; obtaining 7 to 9 hours of restorative sleep, at least 150 minutes of moderate intensity exercise weekly, the importance of healthy social connections,  and stress management techniques were discussed. C.  A full color page of  Calorie density of various food groups per pound showing examples of each food groups was provided to the patient.   She is advised to maintain close follow-up with her PMD.   I spent  27  minutes in the care of the patient today including review of labs from Thyroid Function, CMP, and other relevant labs ; imaging/biopsy records (current and previous including abstractions from other facilities); face-to-face time discussing  her lab results and symptoms, medications doses, her options of short and long term treatment based on the latest standards of care / guidelines;   and documenting the encounter.  Laureen Ochs  participated in the discussions, expressed understanding, and voiced agreement with the above plans.  All questions were answered to her satisfaction. she is encouraged to contact clinic should she have any questions or concerns prior to her return visit.   - Return in about 10 weeks (around 02/04/2023) for F/U with Labs after Surgery.   Glade Lloyd, MD [facility] [address], [place] [address] Phone: [phone]  Fax: [phone]    This note was partially dictated with voice recognition software. Similar sounding words can be transcribed inadequately or may not  be corrected upon review.  11/26/2022, 1:26 PM

## 2023-01-06 ENCOUNTER — Other Ambulatory Visit: Payer: Self-pay | Admitting: *Deleted

## 2023-01-06 DIAGNOSIS — E210 Primary hyperparathyroidism: Secondary | ICD-10-CM | POA: Diagnosis not present

## 2023-01-21 ENCOUNTER — Other Ambulatory Visit (HOSPITAL_COMMUNITY): Payer: Self-pay | Admitting: Surgery

## 2023-01-21 DIAGNOSIS — E210 Primary hyperparathyroidism: Secondary | ICD-10-CM

## 2023-01-25 ENCOUNTER — Other Ambulatory Visit: Payer: Self-pay | Admitting: Family Medicine

## 2023-01-25 DIAGNOSIS — G4733 Obstructive sleep apnea (adult) (pediatric): Secondary | ICD-10-CM

## 2023-02-07 ENCOUNTER — Encounter (HOSPITAL_COMMUNITY)
Admission: RE | Admit: 2023-02-07 | Discharge: 2023-02-07 | Disposition: A | Discharge status: Home / Self Care | Payer: Medicare HMO | Source: Ambulatory Visit | Attending: Surgery | Admitting: Surgery

## 2023-02-07 ENCOUNTER — Ambulatory Visit (HOSPITAL_COMMUNITY)
Admission: RE | Admit: 2023-02-07 | Discharge: 2023-02-07 | Disposition: A | Discharge status: Home / Self Care | Payer: Medicare HMO | Source: Ambulatory Visit | Attending: Surgery | Admitting: Surgery

## 2023-02-07 DIAGNOSIS — E210 Primary hyperparathyroidism: Secondary | ICD-10-CM

## 2023-02-07 DIAGNOSIS — E041 Nontoxic single thyroid nodule: Secondary | ICD-10-CM | POA: Diagnosis not present

## 2023-02-07 MED ORDER — TECHNETIUM TC 99M SESTAMIBI GENERIC - CARDIOLITE
26.1000 | Freq: Once | INTRAVENOUS | Status: AC | PRN
  Administered 2023-02-07: 26.1 via INTRAVENOUS

## 2023-02-08 ENCOUNTER — Ambulatory Visit: Payer: Self-pay | Admitting: Surgery

## 2023-02-08 ENCOUNTER — Ambulatory Visit: Payer: Medicare HMO | Admitting: "Endocrinology

## 2023-02-08 NOTE — Progress Notes (Signed)
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  Will plan to proceed with minimally invasive parathyroidectomy as discussed at the office visit.  Will enter orders and send to schedulers to contact patient.  tmg  Darnell Level, MD [facility] practice Office: [phone]

## 2023-02-13 ENCOUNTER — Encounter: Payer: Self-pay | Admitting: Family Medicine

## 2023-02-15 ENCOUNTER — Other Ambulatory Visit: Payer: Self-pay | Admitting: Family Medicine

## 2023-02-15 ENCOUNTER — Ambulatory Visit: Payer: Medicare HMO | Admitting: "Endocrinology

## 2023-02-15 DIAGNOSIS — G4733 Obstructive sleep apnea (adult) (pediatric): Secondary | ICD-10-CM

## 2023-02-15 MED ORDER — MODAFINIL 200 MG PO TABS: ORAL_TABLET | ORAL | 0 refills | Status: DC

## 2023-02-16 NOTE — Progress Notes (Signed)
COVID Vaccine received:  []  No [x]  Yes Date of any COVID positive Test in last 90 days:  PCP - Lynnea Ferrier, MD Cardiologist - Laurance Flatten, MD,  Jari Favre, PA-C  cardiac clearance in 11-16-2022 office note  Endocrinology- Purcell Nails, MD  Chest x-ray - 11-18-2020  2v  Epic EKG -  08-11-2022  Epic Stress Test - lexiscan 09-11-2020  Epic ECHO - 08-19-2022  Epic Cardiac Cath -  Zio monitor- 09-02-2020  Epic  PCR screen: []  Ordered & Completed           []   No Order but Needs PROFEND           [x]   N/A for this surgery  Surgery Plan:  [x]  Ambulatory                            []  Outpatient in bed                            []  Admit  Anesthesia:    [x]  General  []  Spinal                           []   Choice []   MAC  Pacemaker / ICD device [x]  No []  Yes   Spinal Cord Stimulator:[x]  No []  Yes       History of Sleep Apnea? []  No [x]  Yes   CPAP used?- []  No [x]  Yes    Does the patient monitor blood sugar?          []  No []  Yes  [x]  N/A  Patient has: [x]  NO Hx DM   []  Pre-DM                 []  DM1  []   DM2 Does patient have a [employer] or Dexacom? []  No []  Yes   Fasting Blood Sugar Ranges-  Checks Blood Sugar _____ times a day  Blood Thinner / Instructions:  none Aspirin Instructions:  none  ERAS Protocol Ordered: []  No  [x]  Yes PRE-SURGERY []  ENSURE  []  G2  [x]  No Drink Ordered Patient is to be NPO after: 07:00 am  Comments:   Activity level: Patient is able / unable to climb a flight of stairs without difficulty; []  No CP  []  No SOB, but would have ___   Patient can / can not perform ADLs without assistance.   Anesthesia review: OSA- CPAP, HTN, Tremors, anemia, anxiety, fibromyalgia, GERD, Glaucoma  Patient denies shortness of breath, fever, cough and chest pain at PAT appointment.  Patient verbalized understanding and agreement to the Pre-Surgical Instructions that were given to them at this PAT appointment. Patient was also educated of the need to  review these PAT instructions again prior to her surgery.I reviewed the appropriate phone numbers to call if they have any and questions or concerns.

## 2023-02-16 NOTE — Patient Instructions (Signed)
SURGICAL WAITING ROOM VISITATION Patients having surgery or a procedure may have no more than 2 support people in the waiting area - these visitors may rotate in the visitor waiting room.   Due to an increase in RSV and influenza rates and associated hospitalizations, children ages 33 and under may not visit patients in [facility] hospitals. If the patient needs to stay at the hospital during part of their recovery, the visitor guidelines for inpatient rooms apply.  PRE-OP VISITATION  Pre-op nurse will coordinate an appropriate time for 1 support person to accompany the patient in pre-op.  This support person may not rotate.  This visitor will be contacted when the time is appropriate for the visitor to come back in the pre-op area.  Please refer to the [facility] website for the visitor guidelines for Inpatients (after your surgery is over and you are in a regular room).  You are not required to quarantine at this time prior to your surgery. However, you must do this: Hand Hygiene often Do NOT share personal items Notify your provider if you are in close contact with someone who has COVID or you develop fever 100.4 or greater, new onset of sneezing, cough, sore throat, shortness of breath or body aches.  If you test positive for Covid or have been in contact with anyone that has tested positive in the last 10 days please notify you surgeon.    Your procedure is scheduled on:  Monday  Feb 21, 2023  Report to [facility] Main Entrance: [facility] entrance where the [employer] is available.   Report to admitting at:  07:45    AM  +++++Call this number if you have any questions or problems the morning of surgery [phone]  Do not eat food after Midnight the night prior to your surgery/procedure.  After Midnight you may have the following liquids until  07:00 AM DAY OF SURGERY  Clear Liquid Diet Water Black Coffee (sugar ok, NO MILK/CREAM OR CREAMERS)  Tea (sugar ok, NO  MILK/CREAM OR CREAMERS) regular and decaf                             Plain Jell-O  with no fruit (NO RED)                                           Fruit ices (not with fruit pulp, NO RED)                                     Popsicles (NO RED)                                                                  Juice: apple, WHITE grape, WHITE cranberry Sports drinks like Gatorade or Powerade (NO RED)                FOLLOW  ANY ADDITIONAL PRE OP INSTRUCTIONS YOU RECEIVED FROM YOUR SURGEON'S OFFICE!!!   Oral Hygiene is also important to reduce your risk of infection.  Remember - BRUSH YOUR TEETH THE MORNING OF SURGERY WITH YOUR REGULAR TOOTHPASTE  Do NOT smoke after Midnight the night before surgery.  Take ONLY these medicines the morning of surgery with A SIP OF WATER: Diltiazem, fexofenadine (Allegra), and Tylenol if needed.    If You have been diagnosed with Sleep Apnea - Bring CPAP mask and tubing day of surgery. We will provide you with a CPAP machine on the day of your surgery.                   You may not have any metal on your body including hair pins, jewelry, and body piercing  Do not wear make-up, lotions, powders, perfumes or deodorant  Do not wear nail polish including gel and S&S, artificial / acrylic nails, or any other type of covering on natural nails including finger and toenails. If you have artificial nails, gel coating, etc., that needs to be removed by a nail salon, Please have this removed prior to surgery. Not doing so may mean that your surgery could be cancelled or delayed if the Surgeon or anesthesia staff feels like they are unable to monitor you safely.   Do not shave 48 hours prior to surgery to avoid nicks in your skin which may contribute to postoperative infections.   Patients discharged on the day of surgery will not be allowed to drive home.  Someone NEEDS to stay with you for the first 24 hours after anesthesia.  Do not bring your home medications  to the hospital. The Pharmacy will dispense medications listed on your medication list to you during your admission in the Hospital.  Special Instructions: Bring a copy of your healthcare power of attorney and living will documents the day of surgery, if you wish to have them scanned into your [place] Medical Records- EPIC  Please read over the following fact sheets you were given: IF YOU HAVE QUESTIONS ABOUT YOUR PRE-OP INSTRUCTIONS, PLEASE CALL [phone].   [facility] - Preparing for Surgery Before surgery, you can play an important role.  Because skin is not sterile, your skin needs to be as free of germs as possible.  You can reduce the number of germs on your skin by washing with CHG (chlorahexidine gluconate) soap before surgery.  CHG is an antiseptic cleaner which kills germs and bonds with the skin to continue killing germs even after washing. Please DO NOT use if you have an allergy to CHG or antibacterial soaps.  If your skin becomes reddened/irritated stop using the CHG and inform your nurse when you arrive at Short Stay. Do not shave (including legs and underarms) for at least 48 hours prior to the first CHG shower.  You may shave your face/neck.  Please follow these instructions carefully:  1.  Shower with CHG Soap the night before surgery and the  morning of surgery.  2.  If you choose to wash your hair, wash your hair first as usual with your normal  shampoo.  3.  After you shampoo, rinse your hair and body thoroughly to remove the shampoo.                             4.  Use CHG as you would any other liquid soap.  You can apply chg directly to the skin and wash.  Gently with a scrungie or clean washcloth.  5.  Apply the CHG Soap to your body ONLY FROM THE  NECK DOWN.   Do not use on face/ open                           Wound or open sores. Avoid contact with eyes, ears mouth and genitals (private parts).                       Wash face,  Genitals (private parts) with your  normal soap.             6.  Wash thoroughly, paying special attention to the area where your  surgery  will be performed.  7.  Thoroughly rinse your body with warm water from the neck down.  8.  DO NOT shower/wash with your normal soap after using and rinsing off the CHG Soap.            9.  Pat yourself dry with a clean towel.            10.  Wear clean pajamas.            11.  Place clean sheets on your bed the night of your first shower and do not  sleep with pets.  ON THE DAY OF SURGERY : Do not apply any lotions/deodorants the morning of surgery.  Please wear clean clothes to the hospital/surgery center.    FAILURE TO FOLLOW THESE INSTRUCTIONS MAY RESULT IN THE CANCELLATION OF YOUR SURGERY  PATIENT SIGNATURE_________________________________  NURSE SIGNATURE__________________________________  ________________________________________________________________________

## 2023-02-17 ENCOUNTER — Encounter (HOSPITAL_COMMUNITY)
Admission: RE | Admit: 2023-02-17 | Discharge: 2023-02-17 | Disposition: A | Discharge status: Home / Self Care | Payer: Medicare HMO | Source: Ambulatory Visit | Attending: Surgery | Admitting: Surgery

## 2023-02-17 ENCOUNTER — Other Ambulatory Visit: Payer: Self-pay

## 2023-02-17 ENCOUNTER — Observation Stay (HOSPITAL_BASED_OUTPATIENT_CLINIC_OR_DEPARTMENT_OTHER)
Admission: EM | Admit: 2023-02-17 | Discharge: 2023-02-20 | Disposition: A | Discharge status: Home / Self Care | Payer: Medicare HMO | Attending: Internal Medicine | Admitting: Internal Medicine

## 2023-02-17 ENCOUNTER — Telehealth: Payer: Self-pay | Admitting: Surgery

## 2023-02-17 ENCOUNTER — Encounter (HOSPITAL_BASED_OUTPATIENT_CLINIC_OR_DEPARTMENT_OTHER): Payer: Self-pay

## 2023-02-17 ENCOUNTER — Encounter: Payer: Self-pay | Admitting: Family Medicine

## 2023-02-17 ENCOUNTER — Encounter (HOSPITAL_COMMUNITY): Payer: Self-pay | Admitting: Physician Assistant

## 2023-02-17 ENCOUNTER — Encounter (HOSPITAL_COMMUNITY): Payer: Self-pay

## 2023-02-17 VITALS — BP 113/59 | HR 76 | Temp 99.3°F | Ht 70.0 in | Wt 230.0 lb

## 2023-02-17 DIAGNOSIS — I10 Essential (primary) hypertension: Secondary | ICD-10-CM | POA: Insufficient documentation

## 2023-02-17 DIAGNOSIS — D500 Iron deficiency anemia secondary to blood loss (chronic): Secondary | ICD-10-CM | POA: Diagnosis not present

## 2023-02-17 DIAGNOSIS — Z87891 Personal history of nicotine dependence: Secondary | ICD-10-CM | POA: Insufficient documentation

## 2023-02-17 DIAGNOSIS — R7989 Other specified abnormal findings of blood chemistry: Secondary | ICD-10-CM | POA: Diagnosis present

## 2023-02-17 DIAGNOSIS — G90A Postural orthostatic tachycardia syndrome (POTS): Secondary | ICD-10-CM | POA: Insufficient documentation

## 2023-02-17 DIAGNOSIS — K921 Melena: Secondary | ICD-10-CM | POA: Diagnosis not present

## 2023-02-17 DIAGNOSIS — D62 Acute posthemorrhagic anemia: Secondary | ICD-10-CM

## 2023-02-17 DIAGNOSIS — K253 Acute gastric ulcer without hemorrhage or perforation: Secondary | ICD-10-CM

## 2023-02-17 DIAGNOSIS — D649 Anemia, unspecified: Secondary | ICD-10-CM | POA: Diagnosis not present

## 2023-02-17 DIAGNOSIS — E782 Mixed hyperlipidemia: Secondary | ICD-10-CM | POA: Diagnosis present

## 2023-02-17 DIAGNOSIS — E210 Primary hyperparathyroidism: Secondary | ICD-10-CM | POA: Insufficient documentation

## 2023-02-17 DIAGNOSIS — G4730 Sleep apnea, unspecified: Secondary | ICD-10-CM | POA: Insufficient documentation

## 2023-02-17 DIAGNOSIS — Z79899 Other long term (current) drug therapy: Secondary | ICD-10-CM | POA: Insufficient documentation

## 2023-02-17 DIAGNOSIS — K449 Diaphragmatic hernia without obstruction or gangrene: Secondary | ICD-10-CM | POA: Insufficient documentation

## 2023-02-17 DIAGNOSIS — K219 Gastro-esophageal reflux disease without esophagitis: Secondary | ICD-10-CM | POA: Insufficient documentation

## 2023-02-17 DIAGNOSIS — K269 Duodenal ulcer, unspecified as acute or chronic, without hemorrhage or perforation: Secondary | ICD-10-CM

## 2023-02-17 DIAGNOSIS — E213 Hyperparathyroidism, unspecified: Secondary | ICD-10-CM | POA: Insufficient documentation

## 2023-02-17 DIAGNOSIS — R0602 Shortness of breath: Secondary | ICD-10-CM | POA: Insufficient documentation

## 2023-02-17 DIAGNOSIS — K259 Gastric ulcer, unspecified as acute or chronic, without hemorrhage or perforation: Principal | ICD-10-CM | POA: Insufficient documentation

## 2023-02-17 DIAGNOSIS — K922 Gastrointestinal hemorrhage, unspecified: Secondary | ICD-10-CM

## 2023-02-17 DIAGNOSIS — Z01812 Encounter for preprocedural laboratory examination: Secondary | ICD-10-CM | POA: Insufficient documentation

## 2023-02-17 HISTORY — DX: Postural orthostatic tachycardia syndrome (POTS): G90.A

## 2023-02-17 HISTORY — DX: Unspecified glaucoma: H40.9

## 2023-02-17 HISTORY — DX: Unspecified osteoarthritis, unspecified site: M19.90

## 2023-02-17 LAB — CBC
HCT: 25 % — ABNORMAL LOW (ref 36.0–46.0)
HCT: 24.1 % — ABNORMAL LOW (ref 36.0–46.0)
Hemoglobin: 7.1 g/dL — ABNORMAL LOW (ref 12.0–15.0)
Hemoglobin: 6.8 g/dL — CL (ref 12.0–15.0)
MCH: 18.8 pg — ABNORMAL LOW (ref 26.0–34.0)
MCH: 18.4 pg — ABNORMAL LOW (ref 26.0–34.0)
MCHC: 28.4 g/dL — ABNORMAL LOW (ref 30.0–36.0)
MCHC: 28.2 g/dL — ABNORMAL LOW (ref 30.0–36.0)
MCV: 66.1 fL — ABNORMAL LOW (ref 80.0–100.0)
MCV: 65.1 fL — ABNORMAL LOW (ref 80.0–100.0)
Platelets: 481 10*3/uL — ABNORMAL HIGH (ref 150–400)
Platelets: 454 10*3/uL — ABNORMAL HIGH (ref 150–400)
RBC: 3.78 MIL/uL — ABNORMAL LOW (ref 3.87–5.11)
RBC: 3.7 MIL/uL — ABNORMAL LOW (ref 3.87–5.11)
RDW: 21 % — ABNORMAL HIGH (ref 11.5–15.5)
RDW: 21.3 % — ABNORMAL HIGH (ref 11.5–15.5)
WBC: 5.6 10*3/uL (ref 4.0–10.5)
WBC: 7.4 10*3/uL (ref 4.0–10.5)
nRBC: 0 % (ref 0.0–0.2)
nRBC: 0 % (ref 0.0–0.2)

## 2023-02-17 LAB — BASIC METABOLIC PANEL
Anion gap: 6 (ref 5–15)
BUN: 20 mg/dL (ref 8–23)
CO2: 24 mmol/L (ref 22–32)
Calcium: 9.6 mg/dL (ref 8.9–10.3)
Chloride: 103 mmol/L (ref 98–111)
Creatinine, Ser: 0.7 mg/dL (ref 0.44–1.00)
GFR, Estimated: >60 mL/min (ref >60)
Glucose, Bld: 112 mg/dL — ABNORMAL HIGH (ref 70–99)
Potassium: 4.5 mmol/L (ref 3.5–5.1)
Sodium: 133 mmol/L — ABNORMAL LOW (ref 135–145)

## 2023-02-17 LAB — COMPREHENSIVE METABOLIC PANEL
ALT: 11 U/L (ref 0–44)
AST: 12 U/L — ABNORMAL LOW (ref 15–41)
Albumin: 4.2 g/dL (ref 3.5–5.0)
Alkaline Phosphatase: 92 U/L (ref 38–126)
Anion gap: 8 (ref 5–15)
BUN: 19 mg/dL (ref 8–23)
CO2: 24 mmol/L (ref 22–32)
Calcium: 10.3 mg/dL (ref 8.9–10.3)
Chloride: 105 mmol/L (ref 98–111)
Creatinine, Ser: 0.77 mg/dL (ref 0.44–1.00)
GFR, Estimated: >60 mL/min (ref >60)
Glucose, Bld: 106 mg/dL — ABNORMAL HIGH (ref 70–99)
Potassium: 4.3 mmol/L (ref 3.5–5.1)
Sodium: 137 mmol/L (ref 135–145)
Total Bilirubin: 0.3 mg/dL (ref 0.3–1.2)
Total Protein: 6.3 g/dL — ABNORMAL LOW (ref 6.5–8.1)

## 2023-02-17 NOTE — ED Provider Notes (Signed)
[place] EMERGENCY DEPARTMENT AT [facility] Provider Note   CSN: [id number] Arrival date & time: 02/17/23  1714    History  Chief Complaint  Patient presents with   Anemia    Melanie Hurst is a 72 y.o. female here for evaluation of abnormal lab.  Had something similar 1 year ago where she had some melena after taking doxycycline.  She was followed by Dr. Gerrit Friends with [facility] performed send preoperative lab work prior to thyroid surgery were noted she had new hemoglobin is 7.1 baseline is 12-13.  Patient states she has been having some melena at home, recently completed a course of doxycycline 1 week ago for Lyme disease.  She has noted some lightheadedness and dizziness however thought this was due to her POTS syndrome.  She has no abdominal pain, chest pain, shortness of breath, fever, nausea or vomiting.  No urinary symptoms.  Has not needed a blood transfusion previously  No anticoagulation  Nandigam  HPI     Home Medications Prior to Admission medications   Medication Sig Start Date End Date Taking? Authorizing Provider  acetaminophen (TYLENOL) 500 MG tablet Take 1,000 mg by mouth 2 (two) times daily.    [provider]  AMBULATORY NON FORMULARY MEDICATION Take 1 tablet by mouth daily. Medication Name: Fesol complete (Iron)    [provider]  ARIPiprazole (ABILIFY) 5 MG tablet Take 1 tablet (5 mg total) by mouth daily. 08/17/22   Donita Brooks, MD  Cholecalciferol (VITAMIN D3 MAXIMUM STRENGTH) 125 MCG (5000 UT) capsule Take 5,000 Units by mouth daily. With K2    [provider]  diltiazem (CARDIZEM) 30 MG tablet Take 1 tablet (30 mg total) by mouth 2 (two) times daily. 11/16/22   Sharlene Dory, PA-C  ezetimibe (ZETIA) 10 MG tablet Take 1 tablet (10 mg total) by mouth daily. Patient not taking: Reported on 02/15/2023 11/16/22   Sharlene Dory, PA-C  fexofenadine (ALLEGRA) 180 MG tablet Take 180 mg by mouth daily.    [provider]   losartan (COZAAR) 50 MG tablet Take 50 mg by mouth daily.    [provider]  modafinil (PROVIGIL) 200 MG tablet Take 1 tablet by mouth in the morning and at bedtime. 02/15/23   Donita Brooks, MD  Multiple Vitamin (MULTIVITAMIN ADULT PO) Take 1 tablet by mouth daily.    [provider]  sertraline (ZOLOFT) 50 MG tablet Take 1 tablet (50 mg total) by mouth daily. Stop effexor 09/06/22   Donita Brooks, MD  Vitamin D, Ergocalciferol, (DRISDOL) 1.25 MG (50000 UNIT) CAPS capsule Take 1 capsule (50,000 Units total) by mouth every 7 (seven) days. Patient not taking: Reported on 02/15/2023 03/16/22   Dani Gobble, NP      Allergies    Asa [aspirin], Codeine, and Statins    Review of Systems   Review of Systems  Constitutional: Negative.   HENT: Negative.    Respiratory: Negative.    Cardiovascular: Negative.   Gastrointestinal:  Positive for blood in stool.  Genitourinary: Negative.   Musculoskeletal: Negative.   Skin: Negative.   Neurological:  Positive for dizziness and light-headedness.  All other systems reviewed and are negative.   Physical Exam Updated Vital Signs BP (!) 148/70   Pulse 63   Temp 99.2 F (37.3 C) (Oral)   Resp 16   Ht 5\' 10"  (1.778 m)   Wt 104.3 kg   SpO2 98%   BMI 32.99 kg/m  Physical Exam  Vitals and nursing note reviewed. Exam conducted with a chaperone present.  Constitutional:      General: She is not in acute distress.    Appearance: She is well-developed. She is not ill-appearing, toxic-appearing or diaphoretic.  HENT:     Head: Normocephalic and atraumatic.  Eyes:     Pupils: Pupils are equal, round, and reactive to light.  Cardiovascular:     Rate and Rhythm: Normal rate.     Pulses: Normal pulses.     Heart sounds: Normal heart sounds.  Pulmonary:     Effort: Pulmonary effort is normal. No respiratory distress.     Breath sounds: Normal breath sounds.  Abdominal:     General: Bowel sounds are normal. There is no  distension.     Palpations: Abdomen is soft.  Genitourinary:    Comments: RN present in room Melena Musculoskeletal:        General: Normal range of motion.     Cervical back: Normal range of motion.     Right lower leg: No edema.     Left lower leg: No edema.  Skin:    General: Skin is warm and dry.     Capillary Refill: Capillary refill takes less than 2 seconds.     Coloration: Skin is pale.  Neurological:     General: No focal deficit present.     Mental Status: She is alert and oriented to person, place, and time.  Psychiatric:        Mood and Affect: Mood normal.    ED Results / Procedures / Treatments   Labs (all labs ordered are listed, but only abnormal results are displayed) Labs Reviewed  COMPREHENSIVE METABOLIC PANEL - Abnormal; Notable for the following components:      Result Value   Glucose, Bld 106 (*)    Total Protein 6.3 (*)    AST 12 (*)    All other components within normal limits  CBC - Abnormal; Notable for the following components:   RBC 3.70 (*)    Hemoglobin 6.8 (*)    HCT 24.1 (*)    MCV 65.1 (*)    MCH 18.4 (*)    MCHC 28.2 (*)    RDW 21.3 (*)    Platelets 454 (*)    All other components within normal limits    EKG None  Radiology No results found.  Procedures .Critical Care  Performed by: Linwood Dibbles, PA-C Authorized by: Linwood Dibbles, PA-C   Critical care provider statement:    Critical care time (minutes):  35   Critical care was necessary to treat or prevent imminent or life-threatening deterioration of the following conditions:  Circulatory failure   Critical care was time spent personally by me on the following activities:  Development of treatment plan with patient or surrogate, discussions with consultants, evaluation of patient's response to treatment, examination of patient, ordering and review of laboratory studies, ordering and review of radiographic studies, ordering and performing treatments and  interventions, pulse oximetry, re-evaluation of patient's condition and review of old charts     Medications Ordered in ED Medications - No data to display  ED Course/ Medical Decision Making/ A&P   72 year old here for eval for abnormal lab.  Noted to have new anemia, baseline around 12-13.  Has noted to have some melena.  States she had this previously when she was taking doxycycline about 1 year ago.  States she had a colonoscopy above lower at that time which  did not show any significant findings.  She recently completed a course of doxycycline 1 week ago for Lyme disease.  Has had some lightheaded dizziness which she related to her POTS syndrome.  She does appear pale in room however has stable vital signs.  No Chest pain, shortness of breath or abdominal pain. Rectal exam scant stool however melenotic. Will plan on labs and reassessment  Labs personally viewed and interpreted:  CBC without leukocytosis, hemoglobin 11.8, down from baseline around 12-13 Metabolic panel glucose 106 Occult card was expired by nursing staff.  Patient declined recollection she did have melena on exam  Discussed results with patient, family in room.  She will require admission for blood transfusion and further workup.  She denies any other bleeding such as epistaxis, gingival bleeding, abdominal pain, hemoptysis, hematuria.   CONSULT with Dr. Julian Reil with [facility] agrees to accept patient in transfer for admission  Secure messaged Dr. Myrtie Neither with [facility] to make aware of patient's inpatient status  The patient appears reasonably stabilized for admission considering the current resources, flow, and capabilities available in the ED at this time, and I doubt any other [facility] requiring further screening and/or treatment in the ED prior to admission.                              Medical Decision Making Amount and/or Complexity of Data Reviewed Independent Historian: spouse External Data Reviewed: labs, ECG and  notes. Labs: ordered. Decision-making details documented in ED Course. ECG/medicine tests: ordered and independent interpretation performed. Decision-making details documented in ED Course.  Risk OTC drugs. Prescription drug management. Parenteral controlled substances. Decision regarding hospitalization. Diagnosis or treatment significantly limited by social determinants of health.          Final Clinical Impression(s) / ED Diagnoses Final diagnoses:  Anemia, unspecified type  Melena    Rx / DC Orders ED Discharge Orders     None         Burdette Forehand A, PA-C 02/17/23 2038    Tegeler, Canary Brim, MD 02/18/23 0002

## 2023-02-17 NOTE — Progress Notes (Addendum)
Plan of Care Note for accepted transfer   Patient: Melanie Hurst MRN: [id number]   DOA: 02/17/2023  Facility requesting transfer: [facility] Requesting Provider: Fernand Parkins Reason for transfer: Anemia Facility course: 72 yo F scheduled for parathyroidectomy on 5/13.  Got pre-op lab testing today.  Found to have new anemia with HGB 7.1.  Microcytic.  Sent in to ED.  Seems she's been having melena at home recently.  Has had lightheadedness on standing.  HGB 6.8 in ED.  Melena on exam in ED, though apparently occult card was expired and lab threw it away without running it.  Pt sees [facility].  [facility] will assume care on arrival to accepting facility. Until arrival, care as per EDP. However, [facility] available 24/7 for questions and assistance.  Nursing staff, please page [facility] Admits and Consults [phone]) as soon as the patient arrives to the hospital.    Plan of care: The patient is accepted for admission to Telemetry unit, at [facility]..    Author: Hillary Bow., DO 02/17/2023  Check [email or web address] for on-call coverage.  Nursing staff, Please call [facility] Admits & Consults System-Wide number on Amion as soon as patient's arrival, so appropriate admitting provider can evaluate the pt.

## 2023-02-17 NOTE — Telephone Encounter (Signed)
     Telephone call to patient regarding pre-op lab results prior to planned parathyroidectomy on Monday, 02/21/2023.  Hgb low at 7.1, a significant drop from prior levels.  Patient now at ER for evaluation and possible transfusion.  Will await ER evaluation and records.  Discussed possible postponement of planned surgery pending results.  Will copy Dr. Tanya Nones, her primary care MD.  Darnell Level, MD [facility] practice Office: [phone]

## 2023-02-17 NOTE — ED Notes (Addendum)
Report received from [place], [place]. Patient resting quietly in stretcher, respirations even, unlabored, no acute distress noted. Denies needs at this time.  Visitor at bedside. Awaiting admission.

## 2023-02-17 NOTE — ED Notes (Signed)
Pt ambulatory to restroom with steady gait, back to stretcher.

## 2023-02-17 NOTE — Progress Notes (Signed)
   Melanie Hurst and sent this to Dr. Gerrit Friends for review.

## 2023-02-17 NOTE — ED Triage Notes (Signed)
Patient here POV from Home.  Endorses having Lab Studies completed this AM in preparation for Parathyroidectomy. Instructed to Seek Evaluation for repeat testing and possible transfusion.   Has noted some Darker Stools over past five days. No pain besides some Joint Pain. Some Pica as well.   NAD Noted during Triage. A&Ox4. GCS 15. BIB Wheelchair.

## 2023-02-18 ENCOUNTER — Encounter (HOSPITAL_COMMUNITY): Payer: Self-pay | Admitting: Internal Medicine

## 2023-02-18 ENCOUNTER — Observation Stay (HOSPITAL_COMMUNITY): Payer: Medicare HMO | Admitting: Anesthesiology

## 2023-02-18 ENCOUNTER — Encounter (HOSPITAL_COMMUNITY)
Admission: EM | Disposition: A | Discharge status: Home / Self Care | Payer: Self-pay | Source: Home / Self Care | Attending: Emergency Medicine

## 2023-02-18 ENCOUNTER — Observation Stay (HOSPITAL_BASED_OUTPATIENT_CLINIC_OR_DEPARTMENT_OTHER): Payer: Medicare HMO | Admitting: Anesthesiology

## 2023-02-18 DIAGNOSIS — Z8719 Personal history of other diseases of the digestive system: Secondary | ICD-10-CM | POA: Diagnosis not present

## 2023-02-18 DIAGNOSIS — Z87891 Personal history of nicotine dependence: Secondary | ICD-10-CM | POA: Diagnosis not present

## 2023-02-18 DIAGNOSIS — D500 Iron deficiency anemia secondary to blood loss (chronic): Secondary | ICD-10-CM

## 2023-02-18 DIAGNOSIS — K279 Peptic ulcer, site unspecified, unspecified as acute or chronic, without hemorrhage or perforation: Secondary | ICD-10-CM

## 2023-02-18 DIAGNOSIS — K253 Acute gastric ulcer without hemorrhage or perforation: Secondary | ICD-10-CM

## 2023-02-18 DIAGNOSIS — K449 Diaphragmatic hernia without obstruction or gangrene: Secondary | ICD-10-CM

## 2023-02-18 DIAGNOSIS — D649 Anemia, unspecified: Secondary | ICD-10-CM | POA: Diagnosis not present

## 2023-02-18 DIAGNOSIS — I10 Essential (primary) hypertension: Secondary | ICD-10-CM | POA: Diagnosis not present

## 2023-02-18 DIAGNOSIS — K259 Gastric ulcer, unspecified as acute or chronic, without hemorrhage or perforation: Secondary | ICD-10-CM | POA: Diagnosis not present

## 2023-02-18 DIAGNOSIS — K922 Gastrointestinal hemorrhage, unspecified: Secondary | ICD-10-CM

## 2023-02-18 DIAGNOSIS — R7989 Other specified abnormal findings of blood chemistry: Secondary | ICD-10-CM | POA: Diagnosis not present

## 2023-02-18 DIAGNOSIS — K269 Duodenal ulcer, unspecified as acute or chronic, without hemorrhage or perforation: Secondary | ICD-10-CM | POA: Diagnosis not present

## 2023-02-18 DIAGNOSIS — F418 Other specified anxiety disorders: Secondary | ICD-10-CM | POA: Diagnosis not present

## 2023-02-18 DIAGNOSIS — K921 Melena: Secondary | ICD-10-CM | POA: Diagnosis not present

## 2023-02-18 DIAGNOSIS — D62 Acute posthemorrhagic anemia: Secondary | ICD-10-CM | POA: Diagnosis not present

## 2023-02-18 DIAGNOSIS — Z79899 Other long term (current) drug therapy: Secondary | ICD-10-CM | POA: Diagnosis not present

## 2023-02-18 DIAGNOSIS — R0602 Shortness of breath: Secondary | ICD-10-CM | POA: Diagnosis not present

## 2023-02-18 DIAGNOSIS — K5520 Angiodysplasia of colon without hemorrhage: Secondary | ICD-10-CM | POA: Diagnosis not present

## 2023-02-18 HISTORY — PX: BIOPSY: SHX5522

## 2023-02-18 HISTORY — PX: ENTEROSCOPY: SHX5533

## 2023-02-18 LAB — POCT I-STAT, CHEM 8
BUN: 9 mg/dL (ref 8–23)
Calcium, Ion: 1.2 mmol/L (ref 1.15–1.40)
Chloride: 107 mmol/L (ref 98–111)
Creatinine, Ser: 0.5 mg/dL (ref 0.44–1.00)
Glucose, Bld: 102 mg/dL — ABNORMAL HIGH (ref 70–99)
HCT: 24 % — ABNORMAL LOW (ref 36.0–46.0)
Hemoglobin: 8.2 g/dL — ABNORMAL LOW (ref 12.0–15.0)
Potassium: 4.1 mmol/L (ref 3.5–5.1)
Sodium: 138 mmol/L (ref 135–145)
TCO2: 25 mmol/L (ref 22–32)

## 2023-02-18 LAB — TYPE AND SCREEN
Unit division: 0
Unit division: 0

## 2023-02-18 LAB — PREPARE RBC (CROSSMATCH)

## 2023-02-18 LAB — IRON AND TIBC
Iron: 5 ug/dL — ABNORMAL LOW (ref 28–170)
Saturation Ratios: 1 % — ABNORMAL LOW (ref 10.4–31.8)
TIBC: 472 ug/dL — ABNORMAL HIGH (ref 250–450)
UIBC: 467 ug/dL

## 2023-02-18 LAB — BPAM RBC: ISSUE DATE / TIME: 202405101017

## 2023-02-18 LAB — FOLATE: Folate: 18.3 ng/mL (ref >5.9)

## 2023-02-18 LAB — RETICULOCYTES
Immature Retic Fract: 34.2 % — ABNORMAL HIGH (ref 2.3–15.9)
RBC.: 3.52 MIL/uL — ABNORMAL LOW (ref 3.87–5.11)
Retic Count, Absolute: 80.6 10*3/uL (ref 19.0–186.0)
Retic Ct Pct: 2.3 % (ref 0.4–3.1)

## 2023-02-18 LAB — FERRITIN: Ferritin: 2 ng/mL — ABNORMAL LOW (ref 11–307)

## 2023-02-18 LAB — ABO/RH: ABO/RH(D): O POS

## 2023-02-18 LAB — VITAMIN B12: Vitamin B-12: 351 pg/mL (ref 180–914)

## 2023-02-18 SURGERY — ENTEROSCOPY
Anesthesia: Monitor Anesthesia Care

## 2023-02-18 MED ORDER — HYDRALAZINE HCL 20 MG/ML IJ SOLN: 10.0000 mg | Freq: Four times a day (QID) | INTRAMUSCULAR | Status: DC | PRN

## 2023-02-18 MED ORDER — PANTOPRAZOLE SODIUM 40 MG PO TBEC
40.0000 mg | DELAYED_RELEASE_TABLET | Freq: Two times a day (BID) | ORAL | Status: DC
  Administered 2023-02-18 – 2023-02-19 (×3): 40 mg via ORAL
  Filled 2023-02-18 (×3): qty 1

## 2023-02-18 MED ORDER — DILTIAZEM HCL 30 MG PO TABS
30.0000 mg | ORAL_TABLET | Freq: Two times a day (BID) | ORAL | Status: DC
  Administered 2023-02-18 – 2023-02-19 (×4): 30 mg via ORAL
  Filled 2023-02-18 (×4): qty 1

## 2023-02-18 MED ORDER — SODIUM CHLORIDE 0.9% IV SOLUTION: Freq: Once | INTRAVENOUS | Status: DC

## 2023-02-18 MED ORDER — SODIUM CHLORIDE 0.9 % IV SOLN: INTRAVENOUS | Status: DC

## 2023-02-18 MED ORDER — PANTOPRAZOLE SODIUM 40 MG IV SOLR
40.0000 mg | Freq: Two times a day (BID) | INTRAVENOUS | Status: DC
  Administered 2023-02-18: 40 mg via INTRAVENOUS
  Filled 2023-02-18: qty 10

## 2023-02-18 MED ORDER — SODIUM CHLORIDE 0.9 % IV SOLN
500.0000 mg | Freq: Once | INTRAVENOUS | Status: AC
  Administered 2023-02-18: 500 mg via INTRAVENOUS
  Filled 2023-02-18: qty 25

## 2023-02-18 MED ORDER — IRON SUCROSE 500 MG IVPB - SIMPLE MED
500.0000 mg | Freq: Once | INTRAVENOUS | Status: DC
  Filled 2023-02-18: qty 275

## 2023-02-18 MED ORDER — ACETAMINOPHEN 325 MG PO TABS
650.0000 mg | ORAL_TABLET | Freq: Four times a day (QID) | ORAL | Status: DC | PRN
  Administered 2023-02-18 – 2023-02-20 (×4): 650 mg via ORAL
  Filled 2023-02-18 (×4): qty 2

## 2023-02-18 MED ORDER — PROPOFOL 500 MG/50ML IV EMUL
INTRAVENOUS | Status: DC | PRN
  Administered 2023-02-18: 100 ug/kg/min via INTRAVENOUS

## 2023-02-18 MED ORDER — EZETIMIBE 10 MG PO TABS
10.0000 mg | ORAL_TABLET | Freq: Every day | ORAL | Status: DC
  Administered 2023-02-19: 10 mg via ORAL
  Filled 2023-02-18 (×2): qty 1

## 2023-02-18 MED ORDER — ORAL CARE MOUTH RINSE: 15.0000 mL | OROMUCOSAL | Status: DC | PRN

## 2023-02-18 MED ORDER — ACETAMINOPHEN 650 MG RE SUPP: 650.0000 mg | Freq: Four times a day (QID) | RECTAL | Status: DC | PRN

## 2023-02-18 NOTE — ED Notes (Signed)
[facility] here for patient transport. 

## 2023-02-18 NOTE — Progress Notes (Signed)
PROGRESS NOTE                                                                                                                                                                                                             Patient Demographics:    Melanie Hurst, is a 72 y.o. female, DOB - 07/15/1951, ZOX:[id number]  Outpatient Primary MD for the patient is Donita Brooks, MD    LOS - 0  Admit date - 02/17/2023    Chief Complaint  Patient presents with   Anemia       Brief Narrative (HPI from H&P)   72 y.o. female with medical history significant of anemia, anxiety, arthritis, chronic fatigue syndrome with fibromyalgia, depression, GERD, glaucoma, hypertension, IBS, obesity, POTS, scoliosis, sleep apnea, essential tremor, SVT, hyperlipidemia, history of hyperparathyroidism scheduled for parathyroidectomy on 02/21/2023 and had preop labs done which revealed hemoglobin of 7.1 and she was sent to the ED.   She reports that she has been having black stools for the last 4 to 5 days, also after a rectal exam in the ER she had some bright red blood per rectum but has not seen that before, in the hospital anemia of iron deficiency was confirmed and she was admitted for further treatment with GI consultation.    Subjective:    Karyne Cunniff today has, No headache, No chest pain, No abdominal pain - No Nausea, No new weakness tingling or numbness, no SOB   Assessment  & Plan :    Acute on chronic iron deficiency anemia likely due to upper GI bleed. she confirms 4 to 5 days of melanotic stools and some bright red blood in the ER after digital exam, she will be getting 2 units of packed RBC transfusion on 02/18/2023, IV PPI, iron replacement.  Currently no signs of ongoing active brisk bleeding, GI has been consulted defer further management to GI in terms of endoscopic evaluation.  History of hyperparathyroidism.  Scheduled for  parathyroidectomy in the coming days to weeks, scheduled as convenient based on her medical stability.  Calcium stable right now.  Hypertension.  As needed IV hydralazine & PO Cardizem  GERD.  PPI.  Dyslipidemia.  On Zetia.  History of SVT.  Continue home dose Cardizem.      Condition - Fair  Family Communication  :  None  Code Status :  Full  Consults  :  GI  PUD Prophylaxis : PPI   Procedures  :            Disposition Plan  :    Status is: Observation  DVT Prophylaxis  :    SCDs Start: 02/18/23 0700    Lab Results  Component Value Date   PLT 454 (H) 02/17/2023    Diet :  Diet Order             Diet NPO time specified  Diet effective now                    Inpatient Medications  Scheduled Meds:  sodium chloride   Intravenous Once   pantoprazole (PROTONIX) IV  40 mg Intravenous Q12H   Continuous Infusions:  sodium chloride 50 mL/hr at 02/18/23 0911   PRN Meds:.acetaminophen **OR** acetaminophen  Antibiotics  :    Anti-infectives (From admission, onward)    None         Objective:   Vitals:   02/18/23 0123 02/18/23 0200 02/18/23 0437 02/18/23 0800  BP:  (!) 128/59 (!) 140/63 124/61  Pulse:  73 75 65  Resp:  17 18 13   Temp: 98.6 F (37 C)  98.1 F (36.7 C) 98.5 F (36.9 C)  TempSrc: Oral  Oral Oral  SpO2:  95% 95% 99%  Weight:      Height:        Wt Readings from Last 3 Encounters:  02/17/23 104.3 kg  02/17/23 104.3 kg  11/26/22 109.1 kg    No intake or output data in the 24 hours ending 02/18/23 0942   Physical Exam  Awake Alert, No new F.N deficits, Normal affect [place].AT,PERRAL Supple Neck, No JVD,   Symmetrical Chest wall movement, Good air movement bilaterally, CTAB RRR,No Gallops,Rubs or new Murmurs,  +ve B.Sounds, Abd Soft, No tenderness,   No Cyanosis, Clubbing or edema     Data Review:    Recent Labs  Lab 02/17/23 0830 02/17/23 1728  WBC 5.6 7.4  HGB 7.1* 6.8*  HCT 25.0* 24.1*  PLT 481* 454*   MCV 66.1* 65.1*  MCH 18.8* 18.4*  MCHC 28.4* 28.2*  RDW 21.0* 21.3*    Recent Labs  Lab 02/17/23 0830 02/17/23 1728  NA 133* 137  K 4.5 4.3  CL 103 105  CO2 24 24  ANIONGAP 6 8  GLUCOSE 112* 106*  BUN 20 19  CREATININE 0.70 0.77  AST  --  12*  ALT  --  11  ALKPHOS  --  92  BILITOT  --  0.3  ALBUMIN  --  4.2  CALCIUM 9.6 10.3      Recent Labs  Lab 02/17/23 0830 02/17/23 1728  CALCIUM 9.6 10.3    Recent Labs  Lab 02/17/23 0830 02/17/23 1728  WBC 5.6 7.4  PLT 481* 454*  CREATININE 0.70 0.77      Signature  -   Susa Raring M.D on 02/18/2023 at 9:42 AM   -  To page go to [email or web address]

## 2023-02-18 NOTE — Progress Notes (Signed)
Patient arrived to [phone] from [address] Patient alert & oriented x 4. Assisted with ambulating to bathroom where patient had bloody BM. Vital signs stable. Patient educated on room orientation and how to use call bell. [facility] admits paged of patients arrival. Will continue to monitor and treat per orders.   Lyndal Pulley, RN 02/18/2023 4:50 AM

## 2023-02-18 NOTE — ED Notes (Signed)
Patient resting quietly in stretcher, respirations even, unlabored, no acute distress noted. Denies needs at this time.  

## 2023-02-18 NOTE — Progress Notes (Signed)
Patient currently receiving blood. iSTAT ordered by anesthesiologist and Hgb 8.2. Attending MD notified and said to continue with current transfusion and 2nd transfusion as ordered. Patient showed no signs of reaction with 1st unit after 15 minutes. Blood transfusing @ 700 ml/hr per anesthesiologist order. Patient resting comfortably, VSS.

## 2023-02-18 NOTE — Consult Note (Signed)
Consultation  Referring Provider:   [facility] Primary Care Physician:  Donita Brooks, MD Primary Gastroenterologist:  Dr. Lavon Paganini       Reason for Consultation:     Anemia         HPI:   Melanie Hurst is a 72 y.o. female with past medical history significant for anxiety, arthritis, chronic fatigue syndrome with fibromyalgia, hypertension, glaucoma, POTS, sleep apnea, SVT, IBS, GERD with large hiatal hernia, recently diagnosed hyperparathyroidism scheduled for parathyroidectomy 02/21/2023 found to have hemoglobin 7.1 at preop labs and referred to the ER.  Patient known to [facility] gastroenterology, known IDA with recent workup November 10, 2021. Patient had endoscopy and colonoscopy for IDA which showed large hiatal hernia, no gross lesions esophagus, normal stomach, duodenal erosions without bleeding.  Colonoscopy showed 7 mm polyp, 1 mm polyp diverticulosis and internal hemorrhoids. 11/26/2021 capsule endoscopy 1 small nonbleeding angiectasia in distal small bowel otherwise unremarkable. Patient was post to be evaluated for hiatal hernia repair with Dr. Cliffton Asters but do not see where this was done. In the meantime found to have hyperparathyroidism and was scheduled for parathyroidectomy 02/21/2023 but had acute drop in hemoglobin.  Hemoglobin 7.1 on admission currently 6.8 pending 2 units PRBC. 03/12/2022 hemoglobin 12.9. Other patient hemodynamically stable. WBC 7.4, platelets 454. BUN 19, no acute elevation, creatinine stable.  Liver function unremarkable.  She elected to not have hernia repaired.  She states she was having normal brown stools until about 4-5 days ago started to have formed black stools once daily.  No iron, no pepto.  This continued once daily and then she began to have loose Bms still black stools, had BRB scant blood after rectal exam last night.  She had mild AB pain last Monday, lasted 3 days, felt like burning epigastric pain, no radiation.  She is on pepcid  AC as needed for GERD, will take once every 2-3 months. No dysphagia.  States she is always dizzy/SOB due to POTs but no chest pain.  Patient was recently taking doxycycline for Lyme disease, started April 17th for 2 weeks. . Denies abdominal pain or hematemesis. Denies alcohol or NSAID use. No family was present at the time of my evaluation.  Abnormal ED labs: Abnormal Labs Reviewed  COMPREHENSIVE METABOLIC PANEL - Abnormal; Notable for the following components:      Result Value   Glucose, Bld 106 (*)    Total Protein 6.3 (*)    AST 12 (*)    All other components within normal limits  CBC - Abnormal; Notable for the following components:   RBC 3.70 (*)    Hemoglobin 6.8 (*)    HCT 24.1 (*)    MCV 65.1 (*)    MCH 18.4 (*)    MCHC 28.2 (*)    RDW 21.3 (*)    Platelets 454 (*)    All other components within normal limits  RETICULOCYTES - Abnormal; Notable for the following components:   RBC. 3.52 (*)    Immature Retic Fract 34.2 (*)    All other components within normal limits     Past Medical History:  Diagnosis Date   Anemia    Anxiety    Arthritis    Chronic fatigue syndrome with fibromyalgia    Depression    Fibromyalgia    GERD (gastroesophageal reflux disease)    Glaucoma    Hypertension    IBS (irritable bowel syndrome)    Obesity    POTS (postural  orthostatic tachycardia syndrome)    takes Diltiazem   Scoliosis    Sleep apnea    cpap at 7 cm   Tremor, essential     Surgical History:  She  has a past surgical history that includes Tonsilectomy, adenoidectomy, bilateral myringotomy and tubes (5427); PCOS surgery (1971); Colonoscopy (10/2021); Esophagogastroduodenoscopy (10/2021); and Dilation and curettage of uterus. Family History:  Her family history includes Breast cancer in her paternal aunt and sister; Cancer in her father; Colon cancer in her paternal aunt; Colon polyps in her father; Hyperlipidemia in her father and mother; Hypertension in her  father and mother; Irritable bowel syndrome in her paternal aunt; Kidney failure in her father; Melanoma in her mother; Other in her mother; Stomach cancer in her maternal uncle; Stroke in her mother. Social History:   reports that she quit smoking about 13 years ago. Her smoking use included cigarettes. She has never used smokeless tobacco. She reports current alcohol use. She reports that she does not use drugs.  Prior to Admission medications   Medication Sig Start Date End Date Taking? Authorizing Provider  acetaminophen (TYLENOL) 500 MG tablet Take 1,000 mg by mouth 2 (two) times daily.    [provider]  AMBULATORY NON FORMULARY MEDICATION Take 1 tablet by mouth daily. Medication Name: Fesol complete (Iron)    [provider]  ARIPiprazole (ABILIFY) 5 MG tablet Take 1 tablet (5 mg total) by mouth daily. 08/17/22   Donita Brooks, MD  Cholecalciferol (VITAMIN D3 MAXIMUM STRENGTH) 125 MCG (5000 UT) capsule Take 5,000 Units by mouth daily. With K2    [provider]  diltiazem (CARDIZEM) 30 MG tablet Take 1 tablet (30 mg total) by mouth 2 (two) times daily. 11/16/22   Sharlene Dory, PA-C  ezetimibe (ZETIA) 10 MG tablet Take 1 tablet (10 mg total) by mouth daily. Patient not taking: Reported on 02/15/2023 11/16/22   Sharlene Dory, PA-C  fexofenadine (ALLEGRA) 180 MG tablet Take 180 mg by mouth daily.    [provider]  losartan (COZAAR) 50 MG tablet Take 50 mg by mouth daily.    [provider]  modafinil (PROVIGIL) 200 MG tablet Take 1 tablet by mouth in the morning and at bedtime. 02/15/23   Donita Brooks, MD  Multiple Vitamin (MULTIVITAMIN ADULT PO) Take 1 tablet by mouth daily.    [provider]  sertraline (ZOLOFT) 50 MG tablet Take 1 tablet (50 mg total) by mouth daily. Stop effexor 09/06/22   Donita Brooks, MD  Vitamin D, Ergocalciferol, (DRISDOL) 1.25 MG (50000 UNIT) CAPS capsule Take 1 capsule (50,000 Units total) by mouth  every 7 (seven) days. Patient not taking: Reported on 02/15/2023 03/16/22   Dani Gobble, NP    Current Facility-Administered Medications  Medication Dose Route Frequency Provider Last Rate Last Admin   0.9 %  sodium chloride infusion (Manually program via Guardrails IV Fluids)   Intravenous Once Leroy Sea, MD       0.9 %  sodium chloride infusion   Intravenous Continuous Leroy Sea, MD 50 mL/hr at 02/18/23 0911 New Bag at 02/18/23 0911   acetaminophen (TYLENOL) tablet 650 mg  650 mg Oral Q6H PRN John Giovanni, MD       Or   acetaminophen (TYLENOL) suppository 650 mg  650 mg Rectal Q6H PRN John Giovanni, MD       pantoprazole (PROTONIX) injection 40 mg  40 mg Intravenous Q12H John Giovanni, MD  Allergies as of 02/17/2023 - Review Complete 02/17/2023  Allergen Reaction Noted   Asa [aspirin] Hives 08/19/2017   Codeine  03/15/2013   Statins Nausea Only 02/15/2023    Review of Systems:    Constitutional: No weight loss, fever, chills, weakness or fatigue HEENT: Eyes: No change in vision               Ears, Nose, Throat:  No change in hearing or congestion Skin: No rash or itching Cardiovascular: No chest pain, chest pressure or palpitations   Respiratory: No SOB or cough Gastrointestinal: See HPI and otherwise negative Genitourinary: No dysuria or change in urinary frequency Neurological: No headache, dizziness or syncope Musculoskeletal: No new muscle or joint pain Hematologic: No bleeding or bruising Psychiatric: No history of depression or anxiety     Physical Exam:  Vital signs in last 24 hours: Temp:  [98.1 F (36.7 C)-99.2 F (37.3 C)] 98.5 F (36.9 C) (05/10 0800) Pulse Rate:  [63-89] 65 (05/10 0800) Resp:  [13-25] 13 (05/10 0800) BP: (121-149)/(50-71) 124/61 (05/10 0800) SpO2:  [90 %-99 %] 99 % (05/10 0800) Weight:  [104.3 kg] 104.3 kg (05/09 1726)   Last BM recorded by nurses in past 5 days Stool Type: Type 7 (Liquid  consistency with no solid pieces) (02/18/2023  4:39 AM)  General:   Pleasant, well developed female in no acute distress Head:  Normocephalic and atraumatic. Eyes: sclerae anicteric,conjunctive pale  Heart:  regular rate and rhythm Pulm: Clear anteriorly; no wheezing Abdomen:  Soft, Obese AB, Active bowel sounds. No tenderness . Without guarding and Without rebound, No organomegaly appreciated. Extremities:  Without edema. Msk:  Symmetrical without gross deformities. Peripheral pulses intact.  Neurologic:  Alert and  oriented x4;  No focal deficits.  Skin:   Dry and intact without significant lesions or rashes. Psychiatric:  Cooperative. Normal mood and affect.  LAB RESULTS: Recent Labs    02/17/23 0830 02/17/23 1728  WBC 5.6 7.4  HGB 7.1* 6.8*  HCT 25.0* 24.1*  PLT 481* 454*   BMET Recent Labs    02/17/23 0830 02/17/23 1728  NA 133* 137  K 4.5 4.3  CL 103 105  CO2 24 24  GLUCOSE 112* 106*  BUN 20 19  CREATININE 0.70 0.77  CALCIUM 9.6 10.3   LFT Recent Labs    02/17/23 1728  PROT 6.3*  ALBUMIN 4.2  AST 12*  ALT 11  ALKPHOS 92  BILITOT 0.3   PT/INR No results for input(s): "LABPROT", "INR" in the last 72 hours.  STUDIES: No results found.    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 Hemoglobin 6.8, pending 2 units PRBC, baseline 12-year ago. No elevation of BUN, more likely this is subacute or chronic.  Hyperparathyroidism Planned outpatient parathyroidectomy  Obesity BMI 32  Hypertension    Principal Problem:   Acute GI bleeding Active Problems:   Hypertension   Hyperparathyroidism (HCC)   Mixed hyperlipidemia   Acute blood loss anemia    LOS: 0 days     Plan   -Protonix 40 mg IV BID. --Continue to monitor H&H with  transfusion as needed to maintain hemoglobin greater than 7.   Pending 2 units at this time, to proceed with endoscopic evaluation will need hemoglobin above 7, will try to start running blood at this  time. -Small bowel enteroscopy to evaluate for esophagitis with recent doxycycline, gastritis, cameron lesions, peptic ulcer disease, small bowel AVMs  today if anesthesia allows, keep patient n.p.o.  I thoroughly discussed the procedure to include nature, alternatives, benefits, and risks including but not limited to bleeding, perforation, infection, anesthesia/cardiac and pulmonary complications. Patient provides understanding and gave verbal consent to proceed.   Thank you for your kind consultation, we will continue to follow.   Doree Albee  02/18/2023, 9:15 AM

## 2023-02-18 NOTE — Anesthesia Postprocedure Evaluation (Signed)
Anesthesia Post Note  Patient: Melanie Hurst  Procedure(s) Performed: ENTEROSCOPY BIOPSY     Patient location during evaluation: PACU Anesthesia Type: MAC Level of consciousness: awake and alert Pain management: pain level controlled Vital Signs Assessment: post-procedure vital signs reviewed and stable Respiratory status: spontaneous breathing, nonlabored ventilation, respiratory function stable and patient connected to nasal cannula oxygen Cardiovascular status: stable and blood pressure returned to baseline Postop Assessment: no apparent nausea or vomiting Anesthetic complications: no   No notable events documented.  Last Vitals:  Vitals:   02/18/23 1200 02/18/23 1218  BP: (!) 122/48 119/63  Pulse: 64 73  Resp: 16 20  Temp: 37.1 C 36.9 C  SpO2: 100% 100%    Last Pain:  Vitals:   02/18/23 1218  TempSrc: Oral  PainSc:                  [name]

## 2023-02-18 NOTE — ED Notes (Signed)
Report called to [facility], nurse on accepting unit. Awaiting [facility] transportation.

## 2023-02-18 NOTE — Progress Notes (Addendum)
MEDICATION RELATED CONSULT NOTE - INITIAL   Pharmacy Consult for iron sucrose Indication: iron deficiency  Allergies  Allergen Reactions   Asa [Aspirin] Hives   Codeine     Vomit    Statins Nausea Only    Malaise     Patient Measurements: Height: 5\' 10"  (177.8 cm) Weight: 104.3 kg (229 lb 15 oz) IBW/kg (Calculated) : 68.5 Adjusted Body Weight:   Vital Signs: Temp: 98.5 F (36.9 C) (05/10 0800) Temp Source: Oral (05/10 0800) BP: 124/61 (05/10 0800) Pulse Rate: 65 (05/10 0800) Intake/Output from previous day: No intake/output data recorded. Intake/Output from this shift: No intake/output data recorded.  Labs: Recent Labs    02/17/23 0830 02/17/23 1728  WBC 5.6 7.4  HGB 7.1* 6.8*  HCT 25.0* 24.1*  PLT 481* 454*  CREATININE 0.70 0.77  ALBUMIN  --  4.2  PROT  --  6.3*  AST  --  12*  ALT  --  11  ALKPHOS  --  92  BILITOT  --  0.3   Estimated Creatinine Clearance: 84.3 mL/min (by C-G formula based on SCr of 0.77 mg/dL).   Microbiology: No results found for this or any previous visit (from the past 720 hour(s)).  Medical History: Past Medical History:  Diagnosis Date   Anemia    Anxiety    Arthritis    Chronic fatigue syndrome with fibromyalgia    Depression    Fibromyalgia    GERD (gastroesophageal reflux disease)    Glaucoma    Hypertension    IBS (irritable bowel syndrome)    Obesity    POTS (postural orthostatic tachycardia syndrome)    takes Diltiazem   Scoliosis    Sleep apnea    cpap at 7 cm   Tremor, essential     Medications:  Medications Prior to Admission  Medication Sig Dispense Refill Last Dose   acetaminophen (TYLENOL) 500 MG tablet Take 1,000 mg by mouth 2 (two) times daily.      AMBULATORY NON FORMULARY MEDICATION Take 1 tablet by mouth daily. Medication Name: Grier Rocher complete (Iron)      ARIPiprazole (ABILIFY) 5 MG tablet Take 1 tablet (5 mg total) by mouth daily. 30 tablet 1    Cholecalciferol (VITAMIN D3 MAXIMUM STRENGTH) 125  MCG (5000 UT) capsule Take 5,000 Units by mouth daily. With K2      diltiazem (CARDIZEM) 30 MG tablet Take 1 tablet (30 mg total) by mouth 2 (two) times daily. 180 tablet 3    ezetimibe (ZETIA) 10 MG tablet Take 1 tablet (10 mg total) by mouth daily. (Patient not taking: Reported on 02/15/2023) 90 tablet 3    fexofenadine (ALLEGRA) 180 MG tablet Take 180 mg by mouth daily.      losartan (COZAAR) 50 MG tablet Take 50 mg by mouth daily.      modafinil (PROVIGIL) 200 MG tablet Take 1 tablet by mouth in the morning and at bedtime. 180 tablet 0    Multiple Vitamin (MULTIVITAMIN ADULT PO) Take 1 tablet by mouth daily.      sertraline (ZOLOFT) 50 MG tablet Take 1 tablet (50 mg total) by mouth daily. Stop effexor 90 tablet 3    Vitamin D, Ergocalciferol, (DRISDOL) 1.25 MG (50000 UNIT) CAPS capsule Take 1 capsule (50,000 Units total) by mouth every 7 (seven) days. (Patient not taking: Reported on 02/15/2023) 12 capsule 0     Assessment: 71 yof with pre-op Hgb 7.1 advised to go to ED. She was to have a parathyroidectomy  on 02/21/23. She is receiving 2 units RBC today (each unit has ~200-250 mg iron). She is s/p uGI where a large hiatal hernia was found. This is suspected to be the source of IDA. Pharmacy consulted for IV iron.   Anemia panel shows low iron / TSat / ferritin. B12 and folate are normal. She will have received 400-500 mg iron with transfusions and recommend ~1000 mg replacement total.   Goal of Therapy:  Hgb 13  Plan:  Iron sucrose 500 mg IV once Consider adding ferrous sulfate 325 mg PO daily with food Consider bowel regimen if she will discharge on oral iron   Thank you for involving pharmacy in this patient's care.  Loura Back, PharmD, BCPS Clinical Pharmacist Clinical phone for 02/18/2023 is [phone] 02/18/2023 9:47 AM

## 2023-02-18 NOTE — H&P (Signed)
History and Physical    Melanie Hurst ZOX:[id number] DOB: 23-Feb-1951 DOA: 02/17/2023  PCP: Donita Brooks, MD  Patient coming from: Home  Chief Complaint: Abnormal lab  HPI: Melanie Hurst is a 72 y.o. female with medical history significant of anemia, anxiety, arthritis, chronic fatigue syndrome with fibromyalgia, depression, GERD, glaucoma, hypertension, IBS, obesity, POTS, scoliosis, sleep apnea, essential tremor, SVT, hyperlipidemia, history of hyperparathyroidism scheduled for parathyroidectomy on 02/21/2023 and had preop labs done which revealed hemoglobin of 7.1 and she was sent to the ED.  In the ED, vital signs stable.  Labs showing hemoglobin 6.8 with MCV 65.1 (baseline hemoglobin 12-13), platelet count 454k (chronically elevated and stable).  She had melena on rectal exam done in the ED.  Apparently occult card was expired and lab threw it away without running it.  ED physician sent a message to Dr. Myrtie Neither with [facility] requesting consultation.  Patient states she has had black stools for the past 4 to 5 days.  States on arrival to the hospital this morning she had a bowel movement which was grossly bloody/bright red blood.  She denies abdominal pain and has not vomited any blood.  Denies alcohol or NSAID use.  Patient reports history of GI bleed in January 2024 and states she had EGD, colonoscopy, and capsule study done at that time by [facility].  States she was told that EGD was showing a gastric ulcer which was not bleeding and her capsule study was showing "small bowel ectasia" which was felt to be possible source of bleeding.  She reports chronic dizziness related to POTS.  Denies chest pain or shortness of breath.  Patient states she recently finished taking doxycycline for Lyme disease.  Review of Systems:  Review of Systems  All other systems reviewed and are negative.   Past Medical History:  Diagnosis Date   Anemia    Anxiety    Arthritis    Chronic fatigue syndrome  with fibromyalgia    Depression    Fibromyalgia    GERD (gastroesophageal reflux disease)    Glaucoma    Hypertension    IBS (irritable bowel syndrome)    Obesity    POTS (postural orthostatic tachycardia syndrome)    takes Diltiazem   Scoliosis    Sleep apnea    cpap at 7 cm   Tremor, essential     Past Surgical History:  Procedure Laterality Date   COLONOSCOPY  10/2021   DILATION AND CURETTAGE OF UTERUS     ESOPHAGOGASTRODUODENOSCOPY  10/2021   PCOS surgery  1971   wedge resection of both ovaries   TONSILECTOMY, ADENOIDECTOMY, BILATERAL MYRINGOTOMY AND TUBES  1962     reports that she quit smoking about 13 years ago. Her smoking use included cigarettes. She has never used smokeless tobacco. She reports current alcohol use. She reports that she does not use drugs.  Allergies  Allergen Reactions   Asa [Aspirin] Hives   Codeine     Vomit    Statins Nausea Only    Malaise     Family History  Problem Relation Age of Onset   Hypertension Mother    Melanoma Mother        wrist   Other Mother        brain tumor   Stroke Mother    Hyperlipidemia Mother    Cancer Father    Hyperlipidemia Father    Hypertension Father    Colon polyps Father  not cancerous   Kidney failure Father    Breast cancer Sister    Stomach cancer Maternal Uncle    Breast cancer Paternal Aunt    Colon cancer Paternal Aunt    Irritable bowel syndrome Paternal Aunt    Esophageal cancer Neg Hx    Rectal cancer Neg Hx     Prior to Admission medications   Medication Sig Start Date End Date Taking? Authorizing Provider  acetaminophen (TYLENOL) 500 MG tablet Take 1,000 mg by mouth 2 (two) times daily.    [provider]  AMBULATORY NON FORMULARY MEDICATION Take 1 tablet by mouth daily. Medication Name: Fesol complete (Iron)    [provider]  ARIPiprazole (ABILIFY) 5 MG tablet Take 1 tablet (5 mg total) by mouth daily. 08/17/22   Donita Brooks, MD  Cholecalciferol  (VITAMIN D3 MAXIMUM STRENGTH) 125 MCG (5000 UT) capsule Take 5,000 Units by mouth daily. With K2    [provider]  diltiazem (CARDIZEM) 30 MG tablet Take 1 tablet (30 mg total) by mouth 2 (two) times daily. 11/16/22   Sharlene Dory, PA-C  ezetimibe (ZETIA) 10 MG tablet Take 1 tablet (10 mg total) by mouth daily. Patient not taking: Reported on 02/15/2023 11/16/22   Sharlene Dory, PA-C  fexofenadine (ALLEGRA) 180 MG tablet Take 180 mg by mouth daily.    [provider]  losartan (COZAAR) 50 MG tablet Take 50 mg by mouth daily.    [provider]  modafinil (PROVIGIL) 200 MG tablet Take 1 tablet by mouth in the morning and at bedtime. 02/15/23   Donita Brooks, MD  Multiple Vitamin (MULTIVITAMIN ADULT PO) Take 1 tablet by mouth daily.    [provider]  sertraline (ZOLOFT) 50 MG tablet Take 1 tablet (50 mg total) by mouth daily. Stop effexor 09/06/22   Donita Brooks, MD  Vitamin D, Ergocalciferol, (DRISDOL) 1.25 MG (50000 UNIT) CAPS capsule Take 1 capsule (50,000 Units total) by mouth every 7 (seven) days. Patient not taking: Reported on 02/15/2023 03/16/22   Dani Gobble, NP    Physical Exam: Vitals:   02/18/23 0100 02/18/23 0123 02/18/23 0200 02/18/23 0437  BP: (!) 127/54  (!) 128/59 (!) 140/63  Pulse: 67  73 75  Resp: 18  17 18   Temp:  98.6 F (37 C)  98.1 F (36.7 C)  TempSrc:  Oral  Oral  SpO2: 92%  95% 95%  Weight:      Height:        Physical Exam Vitals reviewed.  Constitutional:      General: She is not in acute distress. HENT:     Head: Normocephalic and atraumatic.  Eyes:     Extraocular Movements: Extraocular movements intact.  Cardiovascular:     Rate and Rhythm: Normal rate and regular rhythm.     Pulses: Normal pulses.  Pulmonary:     Effort: Pulmonary effort is normal. No respiratory distress.     Breath sounds: Normal breath sounds. No wheezing or rales.  Abdominal:     General: Bowel sounds are normal. There is no  distension.     Palpations: Abdomen is soft.     Tenderness: There is no abdominal tenderness.  Musculoskeletal:     Cervical back: Normal range of motion.     Right lower leg: No edema.     Left lower leg: No edema.  Skin:    General: Skin is warm and dry.  Neurological:  General: No focal deficit present.     Mental Status: She is alert and oriented to person, place, and time.     Labs on Admission: I have personally reviewed following labs and imaging studies  CBC: Recent Labs  Lab 02/17/23 0830 02/17/23 1728  WBC 5.6 7.4  HGB 7.1* 6.8*  HCT 25.0* 24.1*  MCV 66.1* 65.1*  PLT 481* 454*   Basic Metabolic Panel: Recent Labs  Lab 02/17/23 0830 02/17/23 1728  NA 133* 137  K 4.5 4.3  CL 103 105  CO2 24 24  GLUCOSE 112* 106*  BUN 20 19  CREATININE 0.70 0.77  CALCIUM 9.6 10.3   GFR: Estimated Creatinine Clearance: 84.3 mL/min (by C-G formula based on SCr of 0.77 mg/dL). Liver Function Tests: Recent Labs  Lab 02/17/23 1728  AST 12*  ALT 11  ALKPHOS 92  BILITOT 0.3  PROT 6.3*  ALBUMIN 4.2   No results for input(s): "LIPASE", "AMYLASE" in the last 168 hours. No results for input(s): "AMMONIA" in the last 168 hours. Coagulation Profile: No results for input(s): "INR", "PROTIME" in the last 168 hours. Cardiac Enzymes: No results for input(s): "CKTOTAL", "CKMB", "CKMBINDEX", "TROPONINI" in the last 168 hours. BNP (last 3 results) No results for input(s): "PROBNP" in the last 8760 hours. HbA1C: No results for input(s): "HGBA1C" in the last 72 hours. CBG: No results for input(s): "GLUCAP" in the last 168 hours. Lipid Profile: No results for input(s): "CHOL", "HDL", "LDLCALC", "TRIG", "CHOLHDL", "LDLDIRECT" in the last 72 hours. Thyroid Function Tests: No results for input(s): "TSH", "T4TOTAL", "FREET4", "T3FREE", "THYROIDAB" in the last 72 hours. Anemia Panel: No results for input(s): "VITAMINB12", "FOLATE", "FERRITIN", "TIBC", "IRON", "RETICCTPCT" in the  last 72 hours. Urine analysis:    Component Value Date/Time   COLORURINE YELLOW 04/01/2020 0829   APPEARANCEUR CLEAR 04/01/2020 0829   LABSPEC 1.020 04/01/2020 0829   PHURINE 5.5 04/01/2020 0829   GLUCOSEU NEGATIVE 04/01/2020 0829   HGBUR NEGATIVE 04/01/2020 0829   KETONESUR TRACE (A) 04/01/2020 0829   PROTEINUR 1+ (A) 04/01/2020 0829   NITRITE NEGATIVE 04/01/2020 0829   LEUKOCYTESUR TRACE (A) 04/01/2020 0829    Radiological Exams on Admission: No results found.  EKG: Independently reviewed.  Sinus rhythm with first-degree AV block.  No significant change since prior tracing.  Assessment and Plan  Acute GI bleed Acute blood loss anemia Patient is presenting with complaint of melena for the past 4 to 5 days but no hematemesis.  She had melena on rectal exam done in the ED.  Apparently occult card was expired and lab threw it away without running it.  Hemoglobin 6.8 on labs in the ED (microcytic anemia) and baseline hemoglobin appears to be 12-13.  Patient had an episode of bright red blood per rectum on arrival to the hospital this morning.  Remains hemodynamically stable.  She is not on anticoagulation or antiplatelet agents.  Denies alcohol or NSAID use.  She had EGD and colonoscopy done in January 2023.  EGD was showing duodenal erosion without bleeding.  Colonoscopy was showing diverticulosis and internal hemorrhoids.  Capsule study done in February 2023 was showing one nonbleeding AVM.  [facility] has been consulted, may need repeat EGD +/- colonoscopy.  Keep n.p.o., IV fluid hydration, and IV Protonix 40 mg every 12 hours.  Type and screen, 1 unit PRBCs ordered after obtaining consent from the patient.  Repeat CBC ordered.  Continue to transfuse if hemoglobin less than 7.  Hyperparathyroidism Calcium within normal range.  She will need outpatient follow-up with general surgery and endocrinology.  Parathyroidectomy surgery was supposed to be on 5/13 but will have to be rescheduled  given acute GI bleed.  Hypertension Avoid antihypertensives at this time given concern for acute GI bleed.  History of SVT: Cardiac monitoring. GERD Hyperlipidemia Mood disorder Pharmacy med rec pending.  DVT prophylaxis: SCDs Code Status: Full Code (discussed with the patient) Family Communication: No family available at this time. Level of care: Telemetry bed Admission status: It is my clinical opinion that referral for OBSERVATION is reasonable and necessary in this patient based on the above information provided. The aforementioned taken together are felt to place the patient at high risk for further clinical deterioration. However, it is anticipated that the patient may be medically stable for discharge from the hospital within 24 to 48 hours.   John Giovanni MD [facility]  If 7PM-7AM, please contact night-coverage [email or web address]  02/18/2023, 6:22 AM

## 2023-02-18 NOTE — Transfer of Care (Signed)
Immediate Anesthesia Transfer of Care Note  Patient: Melanie Hurst  Procedure(s) Performed: ENTEROSCOPY BIOPSY  Patient Location: PACU  Anesthesia Type:MAC  Level of Consciousness: awake, patient cooperative, and responds to stimulation  Airway & Oxygen Therapy: Patient Spontanous Breathing  Post-op Assessment: Report given to RN and Post -op Vital signs reviewed and stable  Post vital signs: Reviewed and stable  Last Vitals:  Vitals Value Taken Time  BP 121/53 02/18/23 1133  Temp    Pulse 75 02/18/23 1135  Resp 23 02/18/23 1135  SpO2 100 % 02/18/23 1135  Vitals shown include unvalidated device data.  Last Pain:  Vitals:   02/18/23 1040  TempSrc: Tympanic  PainSc:          Complications: No notable events documented.

## 2023-02-18 NOTE — Anesthesia Preprocedure Evaluation (Signed)
Anesthesia Evaluation  Patient identified by MRN, date of birth, ID band Patient awake    Reviewed: Allergy & Precautions, H&P , NPO status , Patient's Chart, lab work & pertinent test results  Airway Mallampati: II  TM Distance: <3 FB Neck ROM: Full    Dental no notable dental hx.    Pulmonary sleep apnea and Continuous Positive Airway Pressure Ventilation , former smoker   Pulmonary exam normal breath sounds clear to auscultation       Cardiovascular hypertension, Pt. on medications Normal cardiovascular exam Rhythm:Regular Rate:Normal     Neuro/Psych   Anxiety Depression    negative neurological ROS     GI/Hepatic Neg liver ROS, hiatal hernia,GERD  ,,  Endo/Other  negative endocrine ROS    Renal/GU negative Renal ROS  negative genitourinary   Musculoskeletal negative musculoskeletal ROS (+)    Abdominal   Peds negative pediatric ROS (+)  Hematology  (+) Blood dyscrasia, anemia   Anesthesia Other Findings   Reproductive/Obstetrics negative OB ROS                             Anesthesia Physical Anesthesia Plan  ASA: 3  Anesthesia Plan: MAC   Post-op Pain Management: Minimal or no pain anticipated   Induction: Intravenous  PONV Risk Score and Plan: 2 and Propofol infusion and Treatment may vary due to age or medical condition  Airway Management Planned: Nasal Cannula  Additional Equipment:   Intra-op Plan:   Post-operative Plan:   Informed Consent: I have reviewed the patients History and Physical, chart, labs and discussed the procedure including the risks, benefits and alternatives for the proposed anesthesia with the patient or authorized representative who has indicated his/her understanding and acceptance.     Dental advisory given  Plan Discussed with: CRNA and Surgeon  Anesthesia Plan Comments:        Anesthesia Quick Evaluation

## 2023-02-18 NOTE — Progress Notes (Addendum)
Anesthesia chart review   Case: [id number] Date/Time: 02/21/23 0945   Procedure: LEFT INFERIOR PARATHYROIDECTOMY (Left)   Anesthesia type: General   Pre-op diagnosis: PRIMARY HYPERPARATHYROIDISM   Location: [facility] ROOM 02 / [facility]   Surgeons: Darnell Level, MD       DISCUSSION: 72 year old former smoker with history of HTN, GERD, POTS, sleep apnea, primary hyperparathyroidism scheduled for above procedure 02/21/2023 with Dr. Darnell Level.   Hemoglobin 7.1 at PAT visit.  Spoke with patient he reports fatigue and shortness of breath over the last week.  She reports dark stools that started on Monday.  Advised patient to go to the emergency department for further evaluation and transfusion.  Message sent to surgeon.  Discussed with PCP who agrees with plan.  Discussed with triage nurse at Dr. Ardine Eng office.  Case should be postponed until pt optimized, currently admitted.  VS: BP (!) 113/59 Comment: right arm sitting  Pulse 76   Temp 37.4 C (Oral)   Ht 5\' 10"  (1.778 m)   Wt 104.3 kg   SpO2 98%   BMI 33.00 kg/m   PROVIDERS: Donita Brooks, MD is PCP   LABS:  Labs forwarded to PCP and surgeon (all labs ordered are listed, but only abnormal results are displayed)  Labs Reviewed  BASIC METABOLIC PANEL - Abnormal; Notable for the following components:      Result Value   Sodium 133 (*)    Glucose, Bld 112 (*)    All other components within normal limits  CBC - Abnormal; Notable for the following components:   RBC 3.78 (*)    Hemoglobin 7.1 (*)    HCT 25.0 (*)    MCV 66.1 (*)    MCH 18.8 (*)    MCHC 28.4 (*)    RDW 21.0 (*)    Platelets 481 (*)    All other components within normal limits     IMAGES:   EKG:   CV:  Past Medical History:  Diagnosis Date   Anemia    Anxiety    Arthritis    Chronic fatigue syndrome with fibromyalgia    Depression    Fibromyalgia    GERD (gastroesophageal reflux disease)    Glaucoma    Hypertension    IBS (irritable bowel  syndrome)    Obesity    POTS (postural orthostatic tachycardia syndrome)    takes Diltiazem   Scoliosis    Sleep apnea    cpap at 7 cm   Tremor, essential     Past Surgical History:  Procedure Laterality Date   COLONOSCOPY  10/2021   DILATION AND CURETTAGE OF UTERUS     ESOPHAGOGASTRODUODENOSCOPY  10/2021   PCOS surgery  1971   wedge resection of both ovaries   TONSILECTOMY, ADENOIDECTOMY, BILATERAL MYRINGOTOMY AND TUBES  1962    MEDICATIONS: No current facility-administered medications for this encounter.   No current outpatient medications on file.    0.9 %  sodium chloride infusion (Manually program via Guardrails IV Fluids)   0.9 %  sodium chloride infusion   acetaminophen (TYLENOL) tablet 650 mg   Or   acetaminophen (TYLENOL) suppository 650 mg   pantoprazole (PROTONIX) injection 40 mg   Jodell Cipro Ward, PA-C [facility] Pre-Surgical Testing [phone]

## 2023-02-18 NOTE — Op Note (Signed)
[facility] Patient Name: Melanie Hurst Procedure Date : 02/18/2023 MRN: [id number] Attending MD: Beverley Fiedler , MD, [id number] Date of Birth: Mar 29, 1951 CSN: [id number] Age: 72 Admit Type: Inpatient Procedure:                Upper GI endoscopy Indications:              Iron deficiency anemia secondary to chronic blood                            loss, Heme positive stool Providers:                Carie Caddy. Rhea Belton, MD, Carlena Hurl RN, RN,                            Kandice Robinsons, Technician Referring MD:             [facility] Medicines:                Monitored Anesthesia Care Complications:            No immediate complications. Estimated Blood Loss:     Estimated blood loss was minimal. Procedure:                Pre-Anesthesia Assessment:                           - Prior to the procedure, a History and Physical                            was performed, and patient medications and                            allergies were reviewed. The patient's tolerance of                            previous anesthesia was also reviewed. The risks                            and benefits of the procedure and the sedation                            options and risks were discussed with the patient.                            All questions were answered, and informed consent                            was obtained. Prior Anticoagulants: The patient has                            taken no anticoagulant or antiplatelet agents. ASA                            Grade Assessment: III - A patient with severe  systemic disease. After reviewing the risks and                            benefits, the patient was deemed in satisfactory                            condition to undergo the procedure.                           After obtaining informed consent, the endoscope was                            passed under direct vision. Throughout the                             procedure, the patient's blood pressure,                            pcontinuously. Theulse, and oxygen saturations were                            monitored upper GI endoscopy was accomplished                            without difficulty. The GIF-H190 ([id number]) Olympus                            endoscope was introduced through the mouth and                            advanced to the second part of duodenum. After                            obtaining informed consent, the endoscope was                            passed under direct vision. Throughout the                            procedure, the patient's blood pressure,                            pcontinuously. Theulse, and oxygen saturations were                            monitored upper GI endoscopy was accomplished                            without difficulty. The patient tolerated the                            procedure well. Scope In: Scope Out: Findings:      The examined esophagus was normal.      A large hiatal hernia with a few Cameron erosions was found. Biopsies  were taken with a cold forceps for histology (body, antrum and incisura       to exclude concomitant H. Pylori infection).      The incisura and gastric antrum were normal.      Few non-bleeding superficial duodenal ulcers and erosions were found in       the duodenal bulb. The largest lesion was 4 mm in largest dimension. Impression:               - Normal esophagus.                           - Large hiatal hernia with a few Cameron erosions.                            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                           - Normal incisura and antrum. Biopsies to exclude                            H. Pylori.                           - Non-bleeding duodenal ulcers in bulb.                            - No specimens collected. Moderate Sedation:      N/A Recommendation:           - Return patient to hospital ward for ongoing care.                           - Advance diet as tolerated.                           - Continue present medications. BID PPI is                            recommended x 4 weeks and then daily given hiatal                            hernia with Cameron's lesions.                           - Await pathology results.                           - IV iron while here, may need to complete dosing  as an outpatient.                           - Follow-up at discharge with Dr. Lavon Paganini to                            discuss hiatal hernia repair. Procedure Code(s):        --- Professional ---                           [phone], Esophagogastroduodenoscopy, flexible,                            transoral; with biopsy, single or multiple Diagnosis Code(s):        --- Professional ---                           K44.9, Diaphragmatic hernia without obstruction or                            gangrene                           K25.9, Gastric ulcer, unspecified as acute or                            chronic, without hemorrhage or perforation                           K26.9, Duodenal ulcer, unspecified as acute or                            chronic, without hemorrhage or perforation                           D50.0, Iron deficiency anemia secondary to blood                            loss (chronic)                           R19.5, Other fecal abnormalities CPT copyright 2022 [employer]. All rights reserved. The codes documented in this report are preliminary and upon coder review may  be revised to meet current compliance requirements. Beverley Fiedler, MD 02/18/2023 11:41:08 AM This report has been signed electronically. Number of Addenda: 0

## 2023-02-19 ENCOUNTER — Other Ambulatory Visit: Payer: Self-pay | Admitting: Internal Medicine

## 2023-02-19 DIAGNOSIS — K5520 Angiodysplasia of colon without hemorrhage: Secondary | ICD-10-CM | POA: Diagnosis not present

## 2023-02-19 DIAGNOSIS — D62 Acute posthemorrhagic anemia: Secondary | ICD-10-CM | POA: Diagnosis not present

## 2023-02-19 DIAGNOSIS — K449 Diaphragmatic hernia without obstruction or gangrene: Secondary | ICD-10-CM | POA: Diagnosis not present

## 2023-02-19 DIAGNOSIS — K259 Gastric ulcer, unspecified as acute or chronic, without hemorrhage or perforation: Secondary | ICD-10-CM | POA: Diagnosis not present

## 2023-02-19 DIAGNOSIS — D500 Iron deficiency anemia secondary to blood loss (chronic): Secondary | ICD-10-CM | POA: Diagnosis not present

## 2023-02-19 DIAGNOSIS — K253 Acute gastric ulcer without hemorrhage or perforation: Secondary | ICD-10-CM | POA: Diagnosis not present

## 2023-02-19 DIAGNOSIS — K269 Duodenal ulcer, unspecified as acute or chronic, without hemorrhage or perforation: Secondary | ICD-10-CM | POA: Diagnosis not present

## 2023-02-19 LAB — BASIC METABOLIC PANEL
Anion gap: 7 (ref 5–15)
BUN: 7 mg/dL — ABNORMAL LOW (ref 8–23)
CO2: 25 mmol/L (ref 22–32)
Calcium: 10 mg/dL (ref 8.9–10.3)
Chloride: 109 mmol/L (ref 98–111)
Creatinine, Ser: 0.59 mg/dL (ref 0.44–1.00)
GFR, Estimated: >60 mL/min (ref >60)
Glucose, Bld: 114 mg/dL — ABNORMAL HIGH (ref 70–99)
Potassium: 4.2 mmol/L (ref 3.5–5.1)
Sodium: 141 mmol/L (ref 135–145)

## 2023-02-19 LAB — CBC WITH DIFFERENTIAL/PLATELET
Abs Immature Granulocytes: 0.03 10*3/uL (ref 0.00–0.07)
Basophils Absolute: 0.1 10*3/uL (ref 0.0–0.1)
Basophils Relative: 1 %
Eosinophils Absolute: 0.2 10*3/uL (ref 0.0–0.5)
Eosinophils Relative: 3 %
HCT: 27.7 % — ABNORMAL LOW (ref 36.0–46.0)
Hemoglobin: 7.9 g/dL — ABNORMAL LOW (ref 12.0–15.0)
Immature Granulocytes: 1 %
Lymphocytes Relative: 22 %
Lymphs Abs: 1.5 10*3/uL (ref 0.7–4.0)
MCH: 19.6 pg — ABNORMAL LOW (ref 26.0–34.0)
MCHC: 28.5 g/dL — ABNORMAL LOW (ref 30.0–36.0)
MCV: 68.6 fL — ABNORMAL LOW (ref 80.0–100.0)
Monocytes Absolute: 0.6 10*3/uL (ref 0.1–1.0)
Monocytes Relative: 8 %
Neutro Abs: 4.2 10*3/uL (ref 1.7–7.7)
Neutrophils Relative %: 65 %
Platelets: 443 10*3/uL — ABNORMAL HIGH (ref 150–400)
RBC: 4.04 MIL/uL (ref 3.87–5.11)
RDW: 22.8 % — ABNORMAL HIGH (ref 11.5–15.5)
Smear Review: ADEQUATE
WBC: 6.5 10*3/uL (ref 4.0–10.5)
nRBC: 0.3 % — ABNORMAL HIGH (ref 0.0–0.2)

## 2023-02-19 LAB — CBC
HCT: 27.4 % — ABNORMAL LOW (ref 36.0–46.0)
Hemoglobin: 7.8 g/dL — ABNORMAL LOW (ref 12.0–15.0)
MCH: 19.9 pg — ABNORMAL LOW (ref 26.0–34.0)
MCHC: 28.5 g/dL — ABNORMAL LOW (ref 30.0–36.0)
MCV: 69.9 fL — ABNORMAL LOW (ref 80.0–100.0)
Platelets: 431 10*3/uL — ABNORMAL HIGH (ref 150–400)
RBC: 3.92 MIL/uL (ref 3.87–5.11)
RDW: 22.9 % — ABNORMAL HIGH (ref 11.5–15.5)
WBC: 6.3 10*3/uL (ref 4.0–10.5)
nRBC: 0 % (ref 0.0–0.2)

## 2023-02-19 LAB — TYPE AND SCREEN: ABO/RH(D): O POS

## 2023-02-19 LAB — BPAM RBC: Blood Product Expiration Date: 202406072359

## 2023-02-19 LAB — MAGNESIUM: Magnesium: 2.1 mg/dL (ref 1.7–2.4)

## 2023-02-19 LAB — BRAIN NATRIURETIC PEPTIDE: B Natriuretic Peptide: 72.2 pg/mL (ref 0.0–100.0)

## 2023-02-19 MED ORDER — SERTRALINE HCL 50 MG PO TABS
50.0000 mg | ORAL_TABLET | Freq: Every day | ORAL | Status: DC
  Administered 2023-02-19: 50 mg via ORAL
  Filled 2023-02-19: qty 1

## 2023-02-19 MED ORDER — FOLIC ACID 1 MG PO TABS
1.0000 mg | ORAL_TABLET | Freq: Every day | ORAL | Status: DC
  Administered 2023-02-19: 1 mg via ORAL
  Filled 2023-02-19: qty 1

## 2023-02-19 MED ORDER — FUROSEMIDE 40 MG PO TABS
40.0000 mg | ORAL_TABLET | Freq: Once | ORAL | Status: AC
  Administered 2023-02-19: 40 mg via ORAL
  Filled 2023-02-19: qty 1

## 2023-02-19 MED ORDER — SODIUM CHLORIDE 0.9% IV SOLUTION: Freq: Once | INTRAVENOUS | Status: AC

## 2023-02-19 MED ORDER — FERROUS SULFATE 325 (65 FE) MG PO TABS
325.0000 mg | ORAL_TABLET | Freq: Two times a day (BID) | ORAL | Status: DC
  Administered 2023-02-19: 325 mg via ORAL
  Filled 2023-02-19: qty 1

## 2023-02-19 MED ORDER — ARIPIPRAZOLE 5 MG PO TABS
5.0000 mg | ORAL_TABLET | Freq: Every day | ORAL | Status: DC
  Administered 2023-02-19: 5 mg via ORAL
  Filled 2023-02-19 (×2): qty 1

## 2023-02-19 NOTE — Progress Notes (Signed)
Patient hospitalized for severe iron deficiency anemia  Please check this but I believe she received Venofer 500 mg x 1 on 02/19/2023  Will need to complete IV iron therapy as an outpatient  Please arrange additional doses per pharmacy protocol  Thanks

## 2023-02-19 NOTE — Progress Notes (Signed)
Pt declined use of Cpap at this time. States she is fine with just O2. Instructed to call if she feels as though she needs to wear cpap anytime thoughout the night

## 2023-02-19 NOTE — Progress Notes (Signed)
PROGRESS NOTE                                                                                                                                                                                                             Patient Demographics:    Melanie Hurst, is a 72 y.o. female, DOB - 03/14/1951, ZOX:[id number]  Outpatient Primary MD for the patient is Donita Brooks, MD    LOS - 0  Admit date - 02/17/2023    Chief Complaint  Patient presents with   Anemia       Brief Narrative (HPI from H&P)   72 y.o. female with medical history significant of anemia, anxiety, arthritis, chronic fatigue syndrome with fibromyalgia, depression, GERD, glaucoma, hypertension, IBS, obesity, POTS, scoliosis, sleep apnea, essential tremor, SVT, hyperlipidemia, history of hyperparathyroidism scheduled for parathyroidectomy on 02/21/2023 and had preop labs done which revealed hemoglobin of 7.1 and she was sent to the ED.   She reports that she has been having black stools for the last 4 to 5 days, also after a rectal exam in the ER she had some bright red blood per rectum but has not seen that before, in the hospital anemia of iron deficiency was confirmed and she was admitted for further treatment with GI consultation.    Subjective:   Patient in bed, appears comfortable, denies any headache, no fever, no chest pain or pressure, no shortness of breath , no abdominal pain. No focal weakness.   Assessment  & Plan :    Acute on chronic iron deficiency anemia likely due to upper GI bleed. she confirms 4 to 5 days of melanotic stools and some bright red blood in the ER after digital exam, she will be getting 2 units of packed RBC transfusion on 02/18/2023, IV PPI, iron replacement.  Currently no signs of ongoing active brisk bleeding, seen by GI underwent EGD showing hiatal hernia and Cameron's erosions, H&H is stable but not as high as expected, continue  to monitor another 24 hours to make sure there is no ongoing bleeding.  Transfuse again if needed goal to keep hemoglobin around 7.5.  History of hyperparathyroidism.  Scheduled for parathyroidectomy in the coming days to weeks, scheduled as convenient based on her medical stability.  Calcium stable right now.  Hypertension.  As needed IV hydralazine & PO Cardizem  GERD.  PPI.  Dyslipidemia.  On Zetia.  History of SVT.  Continue home dose Cardizem.      Condition - Fair  Family Communication  :  None  Code Status :  Full  Consults  :  GI  PUD Prophylaxis : PPI   Procedures  :     EGD suggestive of hiatal hernia with Cameron's erosions      Disposition Plan  :    Status is: Observation  DVT Prophylaxis  :    SCDs Start: 02/18/23 0700    Lab Results  Component Value Date   PLT 431 (H) 02/19/2023    Diet :  Diet Order             Diet regular Room service appropriate? Yes; Fluid consistency: Thin  Diet effective now                    Inpatient Medications  Scheduled Meds:  sodium chloride   Intravenous Once   ARIPiprazole  5 mg Oral Daily   diltiazem  30 mg Oral Q12H   ezetimibe  10 mg Oral Daily   furosemide  40 mg Oral Once   pantoprazole  40 mg Oral BID AC   sertraline  50 mg Oral Daily   Continuous Infusions:   PRN Meds:.acetaminophen **OR** acetaminophen, hydrALAZINE, mouth rinse  Antibiotics  :    Anti-infectives (From admission, onward)    None         Objective:   Vitals:   02/18/23 1700 02/18/23 1934 02/18/23 1935 02/18/23 2328  BP:   (!) 131/58 126/85  Pulse: 65 77 72 82  Resp: 16 (!) 27 18 19   Temp:   98.1 F (36.7 C) 98.4 F (36.9 C)  TempSrc:   Oral Oral  SpO2: (!) 84% 95% 97% 92%  Weight:      Height:        Wt Readings from Last 3 Encounters:  02/17/23 104.3 kg  02/17/23 104.3 kg  11/26/22 109.1 kg     Intake/Output Summary (Last 24 hours) at 02/19/2023 1042 Last data filed at 02/19/2023 0900 Gross  per 24 hour  Intake 750.83 ml  Output --  Net 750.83 ml     Physical Exam  Awake Alert, No new F.N deficits, Normal affect [place].AT,PERRAL Supple Neck, No JVD,   Symmetrical Chest wall movement, Good air movement bilaterally, CTAB RRR,No Gallops,Rubs or new Murmurs,  +ve B.Sounds, Abd Soft, No tenderness,   No Cyanosis, Clubbing or edema     Data Review:    Recent Labs  Lab 02/17/23 0830 02/17/23 1728 02/18/23 1034 02/19/23 0635  WBC 5.6 7.4  --  6.3  HGB 7.1* 6.8* 8.2* 7.8*  HCT 25.0* 24.1* 24.0* 27.4*  PLT 481* 454*  --  431*  MCV 66.1* 65.1*  --  69.9*  MCH 18.8* 18.4*  --  19.9*  MCHC 28.4* 28.2*  --  28.5*  RDW 21.0* 21.3*  --  22.9*    Recent Labs  Lab 02/17/23 0830 02/17/23 1728 02/18/23 1034 02/19/23 0635  NA 133* 137 138 141  K 4.5 4.3 4.1 4.2  CL 103 105 107 109  CO2 24 24  --  25  ANIONGAP 6 8  --  7  GLUCOSE 112* 106* 102* 114*  BUN 20 19 9  7*  CREATININE 0.70 0.77 0.50 0.59  AST  --  12*  --   --   ALT  --  11  --   --  ALKPHOS  --  92  --   --   BILITOT  --  0.3  --   --   ALBUMIN  --  4.2  --   --   BNP  --   --   --  72.2  MG  --   --   --  2.1  CALCIUM 9.6 10.3  --  10.0      Recent Labs  Lab 02/17/23 0830 02/17/23 1728 02/19/23 0635  BNP  --   --  72.2  MG  --   --  2.1  CALCIUM 9.6 10.3 10.0    Recent Labs  Lab 02/17/23 0830 02/17/23 1728 02/18/23 1034 02/19/23 0635  WBC 5.6 7.4  --  6.3  PLT 481* 454*  --  431*  CREATININE 0.70 0.77 0.50 0.59      Signature  -   Susa Raring M.D on 02/19/2023 at 10:42 AM   -  To page go to [email or web address]

## 2023-02-19 NOTE — Progress Notes (Addendum)
    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  IDA --most likely secondary to Cameron's lesions though mid to distal small bowel angioectasias certainly can contribute to more chronic loss.  I explained to her the importance of PPI, see #2.  She has received Venofer and will need additional doses as an outpatient -- I will send orders to the infusion clinic to complete Venofer as an outpatient -- She can follow-up with Dr. Lavon Paganini and we can monitor her blood counts and iron stores going forward  2.  Large hiatal hernia with Cameron's lesions --I stressed the importance of PPI.  I allayed her fears regarding PPI and dementia.  We did discuss that PPI can have effect on bone health and we can follow her magnesium over time.  Unless the hernia is fixed she will need PPI, probably even twice daily -- For now pantoprazole 40 mg twice daily  3.  Intestinal angioectasias --beyond the reach of small bowel enteroscopy.  Supplement iron as per #1  4.  Superficial duodenal ulcers --PPI will improve this.  Biopsies pending for H. pylori.  This can be followed up after discharge.  I do not see a barrier to her discharge.  Her hemoglobin is stable and she has received IV iron  I will arrange GI follow-up and additional IV iron infusions as above   Chief Complaint   Patient without abdominal pain Her chronic back pain is bothering her She is feeling depressed because she has not had her sertraline, Abilify or modafinil in 3 days Ate a little bit of dinner but getting ready to eat breakfast No bowel movements since procedure She received IV iron  Vital signs in last 24 hours: Temp:  [97.5 F (36.4 C)-98.8 F (37.1 C)] 98.4 F (36.9 C) (05/10 2328) Pulse Rate:  [64-83] 82 (05/10 2328) Resp:  [16-27] 19 (05/10 2328) BP:  (107-137)/(48-85) 126/85 (05/10 2328) SpO2:  [84 %-100 %] 92 % (05/10 2328)   Gen: awake, alert, NAD, pale HEENT: anicteric  CV: RRR, no mrg Pulm: CTA b/l Abd: soft, NT/ND, +BS throughout Ext: no c/c/e Neuro: nonfocal   Intake/Output from previous day: 05/10 0701 - 05/11 0700 In: 630.8 [I.V.:280.8; Blood:350] Out: -  Intake/Output this shift: Total I/O In: 120 [P.O.:120] Out: -   Lab Results: Recent Labs    02/17/23 0830 02/17/23 1728 02/18/23 1034 02/19/23 0635  WBC 5.6 7.4  --  6.3  HGB 7.1* 6.8* 8.2* 7.8*  HCT 25.0* 24.1* 24.0* 27.4*  PLT 481* 454*  --  431*   BMET Recent Labs    02/17/23 0830 02/17/23 1728 02/18/23 1034 02/19/23 0635  NA 133* 137 138 141  K 4.5 4.3 4.1 4.2  CL 103 105 107 109  CO2 24 24  --  25  GLUCOSE 112* 106* 102* 114*  BUN 20 19 9  7*  CREATININE 0.70 0.77 0.50 0.59  CALCIUM 9.6 10.3  --  10.0   LFT Recent Labs    02/17/23 1728  PROT 6.3*  ALBUMIN 4.2  AST 12*  ALT 11  ALKPHOS 92  BILITOT 0.3      LOS: 0 days   Beverley Fiedler, MD 02/19/2023, 9:17 AM See Loretha Stapler, [facility], to contact our on call provider

## 2023-02-19 NOTE — Care Management Obs Status (Signed)
MEDICARE OBSERVATION STATUS NOTIFICATION   Patient Details  Name: Melanie Hurst MRN: [id number] Date of Birth: 05/14/1951   Medicare Observation Status Notification Given:  Yes    Gordy Clement, RN 02/19/2023, 12:15 PM

## 2023-02-20 ENCOUNTER — Encounter (HOSPITAL_COMMUNITY): Payer: Self-pay | Admitting: Internal Medicine

## 2023-02-20 DIAGNOSIS — K253 Acute gastric ulcer without hemorrhage or perforation: Secondary | ICD-10-CM | POA: Diagnosis not present

## 2023-02-20 DIAGNOSIS — K269 Duodenal ulcer, unspecified as acute or chronic, without hemorrhage or perforation: Secondary | ICD-10-CM | POA: Diagnosis not present

## 2023-02-20 DIAGNOSIS — D500 Iron deficiency anemia secondary to blood loss (chronic): Secondary | ICD-10-CM | POA: Diagnosis not present

## 2023-02-20 LAB — CBC WITH DIFFERENTIAL/PLATELET
Abs Immature Granulocytes: 0 10*3/uL (ref 0.00–0.07)
Basophils Absolute: 0 10*3/uL (ref 0.0–0.1)
Basophils Relative: 0 %
Eosinophils Absolute: 0.3 10*3/uL (ref 0.0–0.5)
Eosinophils Relative: 4 %
HCT: 30.2 % — ABNORMAL LOW (ref 36.0–46.0)
Hemoglobin: 8.9 g/dL — ABNORMAL LOW (ref 12.0–15.0)
Lymphocytes Relative: 25 %
Lymphs Abs: 1.9 10*3/uL (ref 0.7–4.0)
MCH: 20.5 pg — ABNORMAL LOW (ref 26.0–34.0)
MCHC: 29.5 g/dL — ABNORMAL LOW (ref 30.0–36.0)
MCV: 69.6 fL — ABNORMAL LOW (ref 80.0–100.0)
Monocytes Absolute: 0 10*3/uL — ABNORMAL LOW (ref 0.1–1.0)
Monocytes Relative: 0 %
Neutro Abs: 5.4 10*3/uL (ref 1.7–7.7)
Neutrophils Relative %: 71 %
Platelets: 426 10*3/uL — ABNORMAL HIGH (ref 150–400)
RBC: 4.34 MIL/uL (ref 3.87–5.11)
RDW: 24.3 % — ABNORMAL HIGH (ref 11.5–15.5)
WBC: 7.6 10*3/uL (ref 4.0–10.5)
nRBC: 0 /100 WBC
nRBC: 0.4 % — ABNORMAL HIGH (ref 0.0–0.2)

## 2023-02-20 LAB — BPAM RBC: Blood Product Expiration Date: 202406072359

## 2023-02-20 LAB — BASIC METABOLIC PANEL
Anion gap: 5 (ref 5–15)
BUN: 11 mg/dL (ref 8–23)
CO2: 28 mmol/L (ref 22–32)
Calcium: 10 mg/dL (ref 8.9–10.3)
Chloride: 105 mmol/L (ref 98–111)
Creatinine, Ser: 0.7 mg/dL (ref 0.44–1.00)
GFR, Estimated: >60 mL/min (ref >60)
Glucose, Bld: 113 mg/dL — ABNORMAL HIGH (ref 70–99)
Potassium: 3.6 mmol/L (ref 3.5–5.1)
Sodium: 138 mmol/L (ref 135–145)

## 2023-02-20 LAB — BRAIN NATRIURETIC PEPTIDE: B Natriuretic Peptide: 62.2 pg/mL (ref 0.0–100.0)

## 2023-02-20 LAB — TYPE AND SCREEN

## 2023-02-20 LAB — MAGNESIUM: Magnesium: 2 mg/dL (ref 1.7–2.4)

## 2023-02-20 MED ORDER — PANTOPRAZOLE SODIUM 40 MG PO TBEC: 40.0000 mg | DELAYED_RELEASE_TABLET | Freq: Two times a day (BID) | ORAL | 3 refills | Status: DC

## 2023-02-20 MED ORDER — FOLIC ACID 1 MG PO TABS: 1.0000 mg | ORAL_TABLET | Freq: Every day | ORAL | 0 refills | Status: AC

## 2023-02-20 MED ORDER — FERROUS SULFATE 325 (65 FE) MG PO TABS: 325.0000 mg | ORAL_TABLET | Freq: Two times a day (BID) | ORAL | 0 refills | Status: DC

## 2023-02-20 NOTE — Discharge Summary (Signed)
Melanie Hurst ZOX:[id number] DOB: 02-Dec-1950 DOA: 02/17/2023  PCP: Donita Brooks, MD  Admit date: 02/17/2023  Discharge date: 02/20/2023  Admitted From: Home   Disposition:  Home   Recommendations for Outpatient Follow-up:   Follow up with PCP in 1-2 weeks  PCP Please obtain BMP/CBC, 2 view CXR in 1week,  (see Discharge instructions)   PCP Please follow up on the following pending results:    Home Health: None   Equipment/Devices: None  Consultations: GI Discharge Condition: Stable    CODE STATUS: Full    Diet Recommendation: Heart Healthy     Chief Complaint  Patient presents with   Anemia     Brief history of present illness from the day of admission and additional interim summary    72 y.o. female with medical history significant of anemia, anxiety, arthritis, chronic fatigue syndrome with fibromyalgia, depression, GERD, glaucoma, hypertension, IBS, obesity, POTS, scoliosis, sleep apnea, essential tremor, SVT, hyperlipidemia, history of hyperparathyroidism scheduled for parathyroidectomy on 02/21/2023 and had preop labs done which revealed hemoglobin of 7.1 and she was sent to the ED.    She reports that she has been having black stools for the last 4 to 5 days, also after a rectal exam in the ER she had some bright red blood per rectum but has not seen that before, in the hospital anemia of iron deficiency was confirmed and she was admitted for further treatment with GI consultation.                                                                  Hospital Course   Acute on chronic iron deficiency anemia likely due to upper GI bleed. she confirms 4 to 5 days of melanotic stools and some bright red blood in the ER after digital exam, she will be getting 2 units of packed RBC transfusion, IV PPI, IV and PO  iron replacement.  Currently no signs of ongoing active brisk bleeding, seen by GI underwent EGD showing hiatal hernia and Cameron's erosions, H&H is stable and she is now symptom-free will be discharged home on PPI, oral iron and folic acid supplement with outpatient follow-up with PCP and GI.  History of hyperparathyroidism.  Scheduled for parathyroidectomy in the coming days to weeks, scheduled as convenient based on her medical stability.  Calcium stable right now.   Hypertension.  As needed IV hydralazine & PO Cardizem   GERD.  PPI.   Dyslipidemia.  On Zetia.   History of SVT.  Continue home dose Cardizem.    Discharge diagnosis     Principal Problem:   Acute GI bleeding Active Problems:   Hypertension   Hyperparathyroidism (HCC)   Mixed hyperlipidemia   Acute blood loss anemia   Iron deficiency anemia due to chronic blood loss  Cameron lesion, acute   Duodenal ulcer    Discharge instructions    Discharge Instructions     Diet - low sodium heart healthy   Complete by: As directed    Discharge instructions   Complete by: As directed    Follow with Primary MD Donita Brooks, MD in 7 days   Get CBC, CMP  -  checked next visit with your primary MD   Activity: As tolerated with Full fall precautions use walker/cane & assistance as needed  Disposition Home    Diet: Heart Healthy    Special Instructions: If you have smoked or chewed Tobacco  in the last 2 yrs please stop smoking, stop any regular Alcohol  and or any Recreational drug use.  On your next visit with your primary care physician please Get Medicines reviewed and adjusted.  Please request your Prim.MD to go over all Hospital Tests and Procedure/Radiological results at the follow up, please get all Hospital records sent to your Prim MD by signing hospital release before you go home.  If you experience worsening of your admission symptoms, develop shortness of breath, life threatening emergency,  suicidal or homicidal thoughts you must seek medical attention immediately by calling 911 or calling your MD immediately  if symptoms less severe.  You Must read complete instructions/literature along with all the possible adverse reactions/side effects for all the Medicines you take and that have been prescribed to you. Take any new Medicines after you have completely understood and accpet all the possible adverse reactions/side effects.   Increase activity slowly   Complete by: As directed        Discharge Medications   Allergies as of 02/20/2023       Reactions   Asa [aspirin] Hives   Codeine    Vomit    Statins Nausea Only   Malaise         Medication List     TAKE these medications    acetaminophen 500 MG tablet Commonly known as: TYLENOL Take 1,000 mg by mouth 2 (two) times daily.   ARIPiprazole 5 MG tablet Commonly known as: ABILIFY Take 1 tablet (5 mg total) by mouth daily.   diltiazem 30 MG tablet Commonly known as: Cardizem Take 1 tablet (30 mg total) by mouth 2 (two) times daily.   ferrous sulfate 325 (65 FE) MG tablet Take 1 tablet (325 mg total) by mouth 2 (two) times daily with a meal.   fexofenadine 180 MG tablet Commonly known as: ALLEGRA Take 180 mg by mouth daily.   folic acid 1 MG tablet Commonly known as: FOLVITE Take 1 tablet (1 mg total) by mouth daily.   losartan 50 MG tablet Commonly known as: COZAAR Take 50 mg by mouth daily.   modafinil 200 MG tablet Commonly known as: PROVIGIL Take 1 tablet by mouth in the morning and at bedtime.   MULTIVITAMIN ADULT PO Take 1 tablet by mouth daily.   pantoprazole 40 MG tablet Commonly known as: PROTONIX Take 1 tablet (40 mg total) by mouth 2 (two) times daily before a meal.   sertraline 50 MG tablet Commonly known as: ZOLOFT Take 1 tablet (50 mg total) by mouth daily. Stop effexor   Vitamin D3 Maximum Strength 125 MCG (5000 UT) capsule Generic drug: Cholecalciferol Take 5,000 Units by  mouth daily. With K2         Follow-up Information     [facility]. Schedule an appointment as soon as possible for  a visit in 1 week(s).   Specialty: Gastroenterology Contact information: [address] [place] [address] [phone]        Donita Brooks, MD. Schedule an appointment as soon as possible for a visit in 1 week(s).   Specialty: Family Medicine Contact information: [address] [place] [address] [phone]                 Major procedures and Radiology Reports - PLEASE review detailed and final reports thoroughly  -        US THYROID  Result Date: 02/07/2023 CLINICAL DATA:  Other.  Primary hyperparathyroidism. EXAM: THYROID ULTRASOUND TECHNIQUE: Ultrasound examination of the thyroid gland and adjacent soft tissues was performed. COMPARISON:  None Available. FINDINGS: Parenchymal Echotexture: Mildly heterogenous Isthmus: Normal in size measuring 0.3 cm in diameter Right lobe: Normal in size measuring 4.2 x 1.51.1 cm Left lobe: Slightly atrophic measuring 3.2 x 1.3 x 1.1 cm _________________________________________________________ Estimated total number of nodules >/= 1 cm: 0 Number of spongiform nodules >/=  2 cm not described below (TR1): 0 Number of mixed cystic and solid nodules >/= 1.5 cm not described below (TR2): 0 _________________________________________________________ Nodule # 1: Location: Right; Mid Maximum size: 0.9 cm; Other 2 dimensions: 0.8 x 0.7 cm Composition: mixed cystic and solid (1) Echogenicity: isoechoic (1) Shape: not taller-than-wide (0) Margins: smooth (0) Echogenic foci: none (0) ACR TI-RADS total points: 2. ACR TI-RADS risk category: TR2 (2 points). ACR TI-RADS recommendations: This nodule does NOT meet TI-RADS criteria for biopsy or dedicated follow-up. _________________________________________________________ No definitive extra thyroidal nodules are identified.  IMPRESSION: 1. Slightly atrophic and mildly heterogeneous appearing thyroid without worrisome nodule or mass. 2. Solitary punctate (0.9 cm) mixed cystic and solid right-sided thyroid nodule does not meet criteria to recommend percutaneous sampling or continued dedicated follow-up. 3. No definitive extra thyroidal nodules are identified to suggest the presence of a parathyroid adenoma. The above is in keeping with the ACR TI-RADS recommendations - J Am Coll Radiol 2017;14:587-595. Electronically Signed   By: Simonne Come M.D.   On: 02/07/2023 12:51   NM Parathyroid W/Spect  Result Date: 02/07/2023 CLINICAL DATA:  Primary hyperparathyroidism, elevated parathormone and calcium, body aches, tired all the time, sleeping issues EXAM: NM PARATHYROID SCINTIGRAPHY AND SPECT IMAGING TECHNIQUE: Following intravenous administration of radiopharmaceutical, early and 2-hour delayed planar images were obtained in the anterior projection. Delayed triplanar SPECT images were also obtained at 2 hours. RADIOPHARMACEUTICALS:  26.1 mCi Tc-24m Sestamibi IV COMPARISON:  Thyroid ultrasound 02/07/2023 FINDINGS: Planar imaging: Initial image demonstrates slightly increased focal sestamibi uptake inferior to the LEFT thyroid lobe. Delayed image demonstrates washout of tracer from thyroid tissue with persistent abnormal tracer retention inferior to the LEFT thyroid lobe SPECT imaging: Focal abnormal sestamibi retention inferior to the LEFT thyroid lobe consistent with parathyroid adenoma. This is low in position at the LEFT cervical region and was not identified on ultrasound. This could either be in the low LEFT cervical region or could potentially be retromanubrial. A prior CT study from 2023 shows a small nodule 9 mm inferior to the LEFT thyroid lobe posterior to the upper manubrium, (axial series 3, image 29, coronal series 6, image 81), corresponding in position with the scintigraphic finding. No abnormal sestamibi retention at the  expected positions of the remaining parathyroid glands. 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 Electronically Signed   By: Ulyses Southward M.D.   On: 02/07/2023 12:51    Micro Results    No results found for this or any previous visit (from the past 240 hour(s)).  Today   Subjective    Melanie Hurst today has no headache,no chest abdominal pain,no new weakness tingling or numbness, feels much better wants to go home today.    Objective   Blood pressure 133/64, pulse 69, temperature 98.8 F (37.1 C), temperature source Oral, resp. rate 13, height 5\' 10"  (1.778 m), weight 104.3 kg, SpO2 97 %.   Intake/Output Summary (Last 24 hours) at 02/20/2023 0758 Last data filed at 02/19/2023 2130 Gross per 24 hour  Intake 1203.33 ml  Output --  Net 1203.33 ml    Exam  Awake Alert, No new F.N deficits,    [place].AT,PERRAL Supple Neck,   Symmetrical Chest wall movement, Good air movement bilaterally, CTAB RRR,No Gallops,   +ve B.Sounds, Abd Soft, Non tender,  No Cyanosis, Clubbing or edema    Data Review   Recent Labs  Lab 02/17/23 0830 02/17/23 1728 02/18/23 1034 02/19/23 0635 02/19/23 1128 02/20/23 0353  WBC 5.6 7.4  --  6.3 6.5 7.6  HGB 7.1* 6.8* 8.2* 7.8* 7.9* 8.9*  HCT 25.0* 24.1* 24.0* 27.4* 27.7* 30.2*  PLT 481* 454*  --  431* 443* 426*  MCV 66.1* 65.1*  --  69.9* 68.6* 69.6*  MCH 18.8* 18.4*  --  19.9* 19.6* 20.5*  MCHC 28.4* 28.2*  --  28.5* 28.5* 29.5*  RDW 21.0* 21.3*  --  22.9* 22.8* 24.3*  LYMPHSABS  --   --   --   --  1.5 1.9  MONOABS  --   --   --   --  0.6 0.0*  EOSABS  --   --   --   --  0.2 0.3  BASOSABS  --   --   --   --  0.1 0.0    Recent Labs  Lab 02/17/23 0830 02/17/23 1728 02/18/23 1034 02/19/23 0635 02/20/23 0353  NA 133* 137 138 141 138  K 4.5 4.3 4.1 4.2 3.6  CL 103 105 107 109 105  CO2 24  24  --  25 28  ANIONGAP 6 8  --  7 5  GLUCOSE 112* 106* 102* 114* 113*  BUN 20 19 9  7* 11  CREATININE 0.70 0.77 0.50 0.59 0.70  AST  --  12*  --   --   --   ALT  --  11  --   --   --   ALKPHOS  --  92  --   --   --   BILITOT  --  0.3  --   --   --   ALBUMIN  --  4.2  --   --   --   BNP  --   --   --  72.2 62.2  MG  --   --   --  2.1 2.0  CALCIUM 9.6 10.3  --  10.0 10.0    Total Time in preparing paper work, data evaluation and todays exam - 35 minutes  Signature  -    Susa Raring M.D on 02/20/2023 at 7:58 AM   -  To page go to [email or web address]

## 2023-02-20 NOTE — Discharge Instructions (Signed)
Follow with Primary MD Donita Brooks, MD in 7 days   Get CBC, CMP  -  checked next visit with your primary MD   Activity: As tolerated with Full fall precautions use walker/cane & assistance as needed  Disposition Home    Diet: Heart Healthy    Special Instructions: If you have smoked or chewed Tobacco  in the last 2 yrs please stop smoking, stop any regular Alcohol  and or any Recreational drug use.  On your next visit with your primary care physician please Get Medicines reviewed and adjusted.  Please request your Prim.MD to go over all Hospital Tests and Procedure/Radiological results at the follow up, please get all Hospital records sent to your Prim MD by signing hospital release before you go home.  If you experience worsening of your admission symptoms, develop shortness of breath, life threatening emergency, suicidal or homicidal thoughts you must seek medical attention immediately by calling 911 or calling your MD immediately  if symptoms less severe.  You Must read complete instructions/literature along with all the possible adverse reactions/side effects for all the Medicines you take and that have been prescribed to you. Take any new Medicines after you have completely understood and accpet all the possible adverse reactions/side effects.

## 2023-02-21 ENCOUNTER — Ambulatory Visit (HOSPITAL_COMMUNITY): Admission: RE | Admit: 2023-02-21 | Payer: Medicare HMO | Source: Home / Self Care | Admitting: Surgery

## 2023-02-21 ENCOUNTER — Telehealth: Payer: Self-pay | Admitting: Gastroenterology

## 2023-02-21 ENCOUNTER — Encounter (HOSPITAL_COMMUNITY): Admission: RE | Payer: Self-pay | Source: Home / Self Care

## 2023-02-21 ENCOUNTER — Telehealth: Payer: Self-pay

## 2023-02-21 LAB — TYPE AND SCREEN
Antibody Screen: NEGATIVE
Unit division: 0

## 2023-02-21 LAB — SURGICAL PATHOLOGY

## 2023-02-21 LAB — BPAM RBC
Blood Product Expiration Date: 202406112359
ISSUE DATE / TIME: 202405121305
Unit Type and Rh: 5100
Unit Type and Rh: 5100
Unit Type and Rh: 5100

## 2023-02-21 LAB — PREPARE RBC (CROSSMATCH)

## 2023-02-21 SURGERY — PARATHYROIDECTOMY
Anesthesia: General | Laterality: Left

## 2023-02-21 NOTE — Telephone Encounter (Signed)
Patient contacted. She is feeling well. 2 normal bowel movements since her discharge. Patient admitted through the ER 02/17/23 for GI bleed. She had an enteroscopy on 02/18/23. Her discharge instructions are "Schedule an appointment with [facility] (Gastroenterology) in 1 week (02/27/2023) " Patient thinks she was to have a direct colonoscopy. Please review and advise on her follow up. Thanks

## 2023-02-21 NOTE — Telephone Encounter (Signed)
Patient called stating that she was at the ED on 05/09 due to low blood count. She had a endoscopy performed with Dr. Rhea Belton. Was instructed to follow up with Dr. Lavon Paganini for a colonoscopy so the source of bleeding can be found. Patient is requesting a call back to discuss this. Please advise, thank you.

## 2023-02-21 NOTE — Telephone Encounter (Signed)
Auth Submission: NO AUTH NEEDED Site of care: Site of care: [facility] Payer: [employer] Medication & CPT/J Code(s) submitted: Venofer (Iron Sucrose) J1756 Route of submission (phone, fax, portal):  Phone # Fax # Auth type: Buy/Bill Units/visits requested: 5 Reference number:  Approval from: 02/21/2023 to 06/24/2023

## 2023-02-21 NOTE — Telephone Encounter (Signed)
Patient is aware of the recommendations. She said the hospital was arranging the iron infusions.  Confirmed there is a referral for the iron infusion by Dr Rhea Belton dated 02/19/23.

## 2023-02-21 NOTE — Telephone Encounter (Signed)
Please schedule IV iron infusions at infusion center, has severe iron deficiency anemia. Office visit next available appointment either with me or APP.  She does not need a colonoscopy at this point but we can discuss it further when she comes in for her follow-up visit. Thank you.

## 2023-02-24 ENCOUNTER — Encounter: Payer: Self-pay | Admitting: Internal Medicine

## 2023-02-25 ENCOUNTER — Ambulatory Visit: Payer: Medicare HMO | Admitting: Family Medicine

## 2023-03-01 ENCOUNTER — Ambulatory Visit: Payer: Medicare HMO | Admitting: Family Medicine

## 2023-03-01 DIAGNOSIS — R002 Palpitations: Secondary | ICD-10-CM

## 2023-03-01 DIAGNOSIS — G90A Postural orthostatic tachycardia syndrome (POTS): Secondary | ICD-10-CM

## 2023-03-01 DIAGNOSIS — I471 Supraventricular tachycardia, unspecified: Secondary | ICD-10-CM

## 2023-03-01 NOTE — Telephone Encounter (Signed)
Meriam Sprague, MD  Loa Socks, LPN We can certainly try to refer to him and if he is no longer taking POTs patients, there is someone at [facility] who may be taking new referrals.  Referral to EP Dr. Graciela Husbands placed in the system for POTS.  Will send EP Schedulers a message to call the pt to arrange this appt.   Pt made aware via mychart message.

## 2023-03-01 NOTE — Telephone Encounter (Signed)
RE: REFER TO DR. KLEIN IN EP FOR POTS PER DR. Shari Prows Received: Today Hammer, Ermalinda Barrios, LPN; Jeralyn Ruths M I called pt today she didn't answer but left her a VM :) will continue to call her for the appt

## 2023-03-02 ENCOUNTER — Telehealth: Payer: Self-pay | Admitting: *Deleted

## 2023-03-02 NOTE — Telephone Encounter (Signed)
-----   Message from Ronn Melena sent at 03/02/2023  2:08 PM EDT ----- Regarding: RE: REFER TO DR. KLEIN IN EP FOR POTS PER DR. Shari Prows Dutch Gray!!   I have scheduled her on 10.29.24 at 3:45pm!!   Cassie ----- Message ----- From: Noe Gens Sent: 03/01/2023   4:43 PM EDT To: Loa Socks, LPN; Ronn Melena Subject: RE: REFER TO DR. KLEIN IN EP FOR POTS PER DR#  I called pt today she didn't answer but left her a VM :) will continue to call her for the appt ----- Message ----- From: Loa Socks, LPN Sent: 1/61/0960   3:44 PM EDT To: Ronn Melena; Angeline S Hammer Subject: REFER TO DR. KLEIN IN EP FOR POTS PER DR. PE#  Dr. Shari Prows referred this pt to see Dr. Graciela Husbands for POTS  Referral is in the system.  Pt is aware that you will call her to arrange this appt.  She is aware that he can book out several months.  Can you please call her and arrange this appt, then shoot me the date thereafter?  Thanks ladies, Lajoyce Corners

## 2023-03-04 ENCOUNTER — Encounter: Payer: Self-pay | Admitting: Family Medicine

## 2023-03-04 ENCOUNTER — Ambulatory Visit (INDEPENDENT_AMBULATORY_CARE_PROVIDER_SITE_OTHER): Payer: Medicare HMO | Admitting: Family Medicine

## 2023-03-04 VITALS — BP 116/72 | HR 89 | Temp 99.2°F | Ht 70.0 in | Wt 230.8 lb

## 2023-03-04 DIAGNOSIS — K922 Gastrointestinal hemorrhage, unspecified: Secondary | ICD-10-CM

## 2023-03-04 LAB — CBC WITH DIFFERENTIAL/PLATELET
Basophils Absolute: 71 cells/uL (ref 0–200)
Eosinophils Absolute: 111 cells/uL (ref 15–500)
Eosinophils Relative: 1.4 %
HCT: 35.4 % (ref 35.0–45.0)
MCV: 75.8 fL — ABNORMAL LOW (ref 80.0–100.0)
RBC: 4.67 10*6/uL (ref 3.80–5.10)
WBC: 7.9 10*3/uL (ref 3.8–10.8)

## 2023-03-04 MED ORDER — SERTRALINE HCL 100 MG PO TABS
100.0000 mg | ORAL_TABLET | Freq: Every day | ORAL | 3 refills | Status: DC
Start: 2023-03-04 — End: 2024-03-07

## 2023-03-04 NOTE — Progress Notes (Signed)
Subjective:    Patient ID: Melanie Hurst, female    DOB: 1951-03-29, 72 y.o.   MRN: [id number] Patient was recently admitted to the hospital after she was found to have anemia with a hemoglobin of 6.8.  She was going in for an elective surgery and her screening hemoglobin was extremely low.  She admits that she been having melena for 3 or 4 days prior to surgery.  She was admitted to the hospital and received 2 units of blood as well as an iron infusion.  Hemoglobin upon discharge was 8.9.  She is currently on 28 mg of elemental iron a day.  I asked her to double this up to 56 mg of elemental iron.  She is due today to recheck a CBC as well as her iron level.  She would also like to increase her Zoloft to 100 mg due to persistent depression Past Medical History:  Diagnosis Date   Anemia    Anxiety    Arthritis    Chronic fatigue syndrome with fibromyalgia    Depression    Fibromyalgia    GERD (gastroesophageal reflux disease)    Glaucoma    Hypertension    IBS (irritable bowel syndrome)    Obesity    POTS (postural orthostatic tachycardia syndrome)    takes Diltiazem   Scoliosis    Sleep apnea    cpap at 7 cm   Tremor, essential    Past Surgical History:  Procedure Laterality Date   BIOPSY  02/18/2023   Procedure: BIOPSY;  Surgeon: Beverley Fiedler, MD;  Location: [facility] ENDOSCOPY;  Service: Gastroenterology;;   COLONOSCOPY  10/2021   DILATION AND CURETTAGE OF UTERUS     ENTEROSCOPY N/A 02/18/2023   Procedure: ENTEROSCOPY;  Surgeon: Beverley Fiedler, MD;  Location: [facility] ENDOSCOPY;  Service: Gastroenterology;  Laterality: N/A;   ESOPHAGOGASTRODUODENOSCOPY  10/2021   PCOS surgery  1971   wedge resection of both ovaries   TONSILECTOMY, ADENOIDECTOMY, BILATERAL MYRINGOTOMY AND TUBES  1962   Current Outpatient Medications on File Prior to Visit  Medication Sig Dispense Refill   acetaminophen (TYLENOL) 500 MG tablet Take 1,000 mg by mouth 2 (two) times daily.     ARIPiprazole (ABILIFY) 5 MG  tablet Take 1 tablet (5 mg total) by mouth daily. 30 tablet 1   Cholecalciferol (VITAMIN D3 MAXIMUM STRENGTH) 125 MCG (5000 UT) capsule Take 5,000 Units by mouth daily. With K2     diltiazem (CARDIZEM) 30 MG tablet Take 1 tablet (30 mg total) by mouth 2 (two) times daily. 180 tablet 3   ferrous sulfate 325 (65 FE) MG tablet Take 325 mg by mouth daily with breakfast.     fexofenadine (ALLEGRA) 180 MG tablet Take 180 mg by mouth daily.     folic acid (FOLVITE) 1 MG tablet Take 1 tablet (1 mg total) by mouth daily. 30 tablet 0   losartan (COZAAR) 50 MG tablet Take 50 mg by mouth daily.     modafinil (PROVIGIL) 200 MG tablet Take 1 tablet by mouth in the morning and at bedtime. 180 tablet 0   Multiple Vitamin (MULTIVITAMIN ADULT PO) Take 1 tablet by mouth daily.     pantoprazole (PROTONIX) 40 MG tablet Take 1 tablet (40 mg total) by mouth 2 (two) times daily before a meal. 60 tablet 3   No current facility-administered medications on file prior to visit.   Allergies  Allergen Reactions   Asa [Aspirin] Hives   Codeine  Vomit    Statins Nausea Only    Malaise    Social History   Socioeconomic History   Marital status: Married    Spouse name: Not on file   Number of children: 0   Years of education: Not on file   Highest education level: Bachelor's degree (e.g., BA, AB, BS)  Occupational History   Occupation: retired  Tobacco Use   Smoking status: Former    Years: 6    Types: Cigarettes    Quit date: 10/11/2009    Years since quitting: 13.4   Smokeless tobacco: Never  Vaping Use   Vaping Use: Former   Devices: vaped to stop smoking  Substance and Sexual Activity   Alcohol use: Yes    Comment: Occasional wine   Drug use: No   Sexual activity: Not Currently  Other Topics Concern   Not on file  Social History Narrative   Not on file   Social Determinants of Health   Financial Resource Strain: Medium Risk (03/03/2023)   Overall Financial Resource Strain (CARDIA)     Difficulty of Paying Living Expenses: Somewhat hard  Food Insecurity: Food Insecurity Present (03/03/2023)   Hunger Vital Sign    Worried About Running Out of Food in the Last Year: Sometimes true    Ran Out of Food in the Last Year: Sometimes true  Transportation Needs: No Transportation Needs (03/03/2023)   PRAPARE - Administrator, Civil Service (Medical): No    Lack of Transportation (Non-Medical): No  Physical Activity: Unknown (03/03/2023)   Exercise Vital Sign    Days of Exercise per Week: 0 days    Minutes of Exercise per Session: Not on file  Stress: No Stress Concern Present (03/03/2023)   [employer] of Occupational Health - Occupational Stress Questionnaire    Feeling of Stress : Only a little  Social Connections: Moderately Integrated (03/03/2023)   Social Connection and Isolation Panel [NHANES]    Frequency of Communication with Friends and Family: Once a week    Frequency of Social Gatherings with Friends and Family: Once a week    Attends Religious Services: More than 4 times per year    Active Member of [employer] or Organizations: Yes    Attends Engineer, structural: More than 4 times per year    Marital Status: Married  Catering manager Violence: Not on file      Review of Systems  All other systems reviewed and are negative.      Objective:   Physical Exam Vitals reviewed.  Constitutional:      Appearance: She is well-developed. She is obese. She is not diaphoretic.  Cardiovascular:     Rate and Rhythm: Normal rate and regular rhythm.     Heart sounds: Normal heart sounds. No murmur heard. Pulmonary:     Effort: Pulmonary effort is normal. No respiratory distress.     Breath sounds: Normal breath sounds. No wheezing.  Abdominal:     General: Bowel sounds are normal. There is no distension.     Palpations: Abdomen is soft.     Tenderness: There is no abdominal tenderness.  Musculoskeletal:     Right lower leg: No edema.     Left  lower leg: No edema.  Neurological:     Mental Status: She is alert.     Motor: No abnormal muscle tone.     Deep Tendon Reflexes: Reflexes are normal and symmetric.  Assessment & Plan:  Gastrointestinal hemorrhage, unspecified gastrointestinal hemorrhage type - Plan: CBC with Differential/Platelet, BASIC METABOLIC PANEL WITH GFR, Ferritin, Iron Recheck and iron level today along with a CBC.  If hemoglobin is stable or elevated BR 8.9, I believe that this likely shows resolution of any GI bleeding.  Continue proton pump inhibitor indefinitely to prevent recurrent bleeding from the erosions seen in her stomach.  Check CBC and iron level monthly until normal then likely discontinue iron and monitor iron level and CBC every 3 months after that to ensure adequate absorption of dietary iron

## 2023-03-05 LAB — CBC WITH DIFFERENTIAL/PLATELET
Absolute Monocytes: 506 cells/uL (ref 200–950)
Basophils Relative: 0.9 %
Hemoglobin: 10.4 g/dL — ABNORMAL LOW (ref 11.7–15.5)
Lymphs Abs: 1722 cells/uL (ref 850–3900)
MCH: 22.3 pg — ABNORMAL LOW (ref 27.0–33.0)
MCHC: 29.4 g/dL — ABNORMAL LOW (ref 32.0–36.0)
MPV: 9.5 fL (ref 7.5–12.5)
Monocytes Relative: 6.4 %
Neutro Abs: 5491 cells/uL (ref 1500–7800)
Neutrophils Relative %: 69.5 %
Platelets: 450 10*3/uL — ABNORMAL HIGH (ref 140–400)
RDW: 26.8 % — ABNORMAL HIGH (ref 11.0–15.0)
Total Lymphocyte: 21.8 %

## 2023-03-05 LAB — BASIC METABOLIC PANEL WITH GFR
BUN: 18 mg/dL (ref 7–25)
CO2: 26 mmol/L (ref 20–32)
Calcium: 11 mg/dL — ABNORMAL HIGH (ref 8.6–10.4)
Chloride: 107 mmol/L (ref 98–110)
Creat: 0.9 mg/dL (ref 0.60–1.00)
Glucose, Bld: 99 mg/dL (ref 65–99)
Potassium: 4.8 mmol/L (ref 3.5–5.3)
Sodium: 141 mmol/L (ref 135–146)
eGFR: 68 mL/min/{1.73_m2} (ref >=60)

## 2023-03-05 LAB — FERRITIN: Ferritin: 33 ng/mL (ref 16–288)

## 2023-03-05 LAB — IRON: Iron: 28 ug/dL — ABNORMAL LOW (ref 45–160)

## 2023-03-08 ENCOUNTER — Ambulatory Visit: Payer: Medicare HMO | Admitting: Physician Assistant

## 2023-03-09 ENCOUNTER — Ambulatory Visit (INDEPENDENT_AMBULATORY_CARE_PROVIDER_SITE_OTHER): Payer: Medicare HMO

## 2023-03-09 ENCOUNTER — Ambulatory Visit: Payer: Medicare HMO | Admitting: Physician Assistant

## 2023-03-09 VITALS — BP 121/74 | HR 62 | Temp 98.5°F | Resp 18 | Ht 70.0 in | Wt 232.0 lb

## 2023-03-09 DIAGNOSIS — D500 Iron deficiency anemia secondary to blood loss (chronic): Secondary | ICD-10-CM | POA: Diagnosis not present

## 2023-03-09 DIAGNOSIS — K922 Gastrointestinal hemorrhage, unspecified: Secondary | ICD-10-CM | POA: Diagnosis not present

## 2023-03-09 MED ORDER — DIPHENHYDRAMINE HCL 25 MG PO CAPS: 25.0000 mg | ORAL_CAPSULE | Freq: Once | ORAL | Status: DC

## 2023-03-09 MED ORDER — ACETAMINOPHEN 325 MG PO TABS
650.0000 mg | ORAL_TABLET | Freq: Once | ORAL | Status: AC
  Administered 2023-03-09: 650 mg via ORAL
  Filled 2023-03-09: qty 2

## 2023-03-09 MED ORDER — SODIUM CHLORIDE 0.9 % IV SOLN
200.0000 mg | Freq: Once | INTRAVENOUS | Status: AC
  Administered 2023-03-09: 200 mg via INTRAVENOUS
  Filled 2023-03-09: qty 10

## 2023-03-09 NOTE — Progress Notes (Signed)
Diagnosis: Iron Deficiency Anemia  Provider:  Chilton Greathouse MD  Procedure: IV Infusion  IV Type: Peripheral, IV Location: L Antecubital  Venofer (Iron Sucrose), Dose: 200 mg  Infusion Start Time: 1513  Infusion Stop Time: 1531  Post Infusion IV Care: Observation period completed and Peripheral IV Discontinued  Discharge: Condition: Good, Destination: Home . AVS Declined  Performed by:  Loney Hering, LPN

## 2023-03-14 ENCOUNTER — Telehealth: Payer: Self-pay | Admitting: Family Medicine

## 2023-03-14 ENCOUNTER — Ambulatory Visit (INDEPENDENT_AMBULATORY_CARE_PROVIDER_SITE_OTHER): Payer: Medicare HMO | Admitting: *Deleted

## 2023-03-14 VITALS — BP 132/75 | HR 66 | Temp 98.3°F | Resp 16 | Ht 70.0 in | Wt 230.8 lb

## 2023-03-14 DIAGNOSIS — D500 Iron deficiency anemia secondary to blood loss (chronic): Secondary | ICD-10-CM | POA: Diagnosis not present

## 2023-03-14 DIAGNOSIS — K922 Gastrointestinal hemorrhage, unspecified: Secondary | ICD-10-CM | POA: Diagnosis not present

## 2023-03-14 MED ORDER — DIPHENHYDRAMINE HCL 25 MG PO CAPS: 25.0000 mg | ORAL_CAPSULE | Freq: Once | ORAL | Status: DC

## 2023-03-14 MED ORDER — SODIUM CHLORIDE 0.9 % IV SOLN
200.0000 mg | Freq: Once | INTRAVENOUS | Status: AC
  Administered 2023-03-14: 200 mg via INTRAVENOUS
  Filled 2023-03-14: qty 10

## 2023-03-14 MED ORDER — ACETAMINOPHEN 325 MG PO TABS: 650.0000 mg | ORAL_TABLET | Freq: Once | ORAL | Status: DC

## 2023-03-14 NOTE — Progress Notes (Signed)
Diagnosis: Iron Deficiency Anemia  Provider:  Chilton Greathouse MD  Procedure: IV Infusion  IV Type: Peripheral, IV Location: R Antecubital  Venofer (Iron Sucrose), Dose: 200 mg  Infusion Start Time: 1339 pm  Infusion Stop Time: 1357  pm  Post Infusion IV Care: Observation period completed and Peripheral IV Discontinued  Discharge: Condition: Good, Destination: Home . AVS Declined  Performed by:  Forrest Moron, RN

## 2023-03-14 NOTE — Telephone Encounter (Signed)
Left message to return call to reschedule lab appt on 04/08/23. Both providers on vacation; lab will be closed.

## 2023-03-16 ENCOUNTER — Ambulatory Visit (INDEPENDENT_AMBULATORY_CARE_PROVIDER_SITE_OTHER): Payer: Medicare HMO

## 2023-03-16 VITALS — BP 116/78 | HR 71 | Temp 98.2°F | Resp 18 | Ht 70.0 in | Wt 229.6 lb

## 2023-03-16 DIAGNOSIS — D500 Iron deficiency anemia secondary to blood loss (chronic): Secondary | ICD-10-CM

## 2023-03-16 DIAGNOSIS — K922 Gastrointestinal hemorrhage, unspecified: Secondary | ICD-10-CM

## 2023-03-16 MED ORDER — ACETAMINOPHEN 325 MG PO TABS: 650.0000 mg | ORAL_TABLET | Freq: Once | ORAL | Status: DC

## 2023-03-16 MED ORDER — DIPHENHYDRAMINE HCL 25 MG PO CAPS: 25.0000 mg | ORAL_CAPSULE | Freq: Once | ORAL | Status: DC

## 2023-03-16 MED ORDER — SODIUM CHLORIDE 0.9 % IV SOLN
200.0000 mg | Freq: Once | INTRAVENOUS | Status: AC
  Administered 2023-03-16: 200 mg via INTRAVENOUS
  Filled 2023-03-16: qty 10

## 2023-03-16 NOTE — Progress Notes (Signed)
Diagnosis: Iron Deficiency Anemia  Provider:  Chilton Greathouse MD  Procedure: IV Infusion  IV Type: Peripheral, IV Location: L Antecubital  Venofer (Iron Sucrose), Dose: 200 mg  Infusion Start Time: 1306  Infusion Stop Time: 1323  Post Infusion IV Care: Peripheral IV Discontinued  Discharge: Condition: Good, Destination: Home . AVS Declined  Performed by:  Garnette Czech, RN

## 2023-03-17 ENCOUNTER — Telehealth: Payer: Self-pay

## 2023-03-17 ENCOUNTER — Other Ambulatory Visit: Payer: Self-pay | Admitting: Family Medicine

## 2023-03-17 NOTE — Telephone Encounter (Signed)
My Chart message from patient:   I am taking the sertraline 100mg  every morning but I wake up fearful and anxious. I think that taking  50mg  at night would help with that. Could you call me in a prescription for  50 mg to be taken at night?

## 2023-03-18 ENCOUNTER — Ambulatory Visit (INDEPENDENT_AMBULATORY_CARE_PROVIDER_SITE_OTHER): Payer: Medicare HMO

## 2023-03-18 ENCOUNTER — Other Ambulatory Visit: Payer: Self-pay | Admitting: Family Medicine

## 2023-03-18 VITALS — BP 114/69 | HR 64 | Temp 98.2°F | Resp 18 | Ht 70.0 in | Wt 230.0 lb

## 2023-03-18 DIAGNOSIS — D500 Iron deficiency anemia secondary to blood loss (chronic): Secondary | ICD-10-CM | POA: Diagnosis not present

## 2023-03-18 DIAGNOSIS — K922 Gastrointestinal hemorrhage, unspecified: Secondary | ICD-10-CM

## 2023-03-18 DIAGNOSIS — F324 Major depressive disorder, single episode, in partial remission: Secondary | ICD-10-CM

## 2023-03-18 DIAGNOSIS — F333 Major depressive disorder, recurrent, severe with psychotic symptoms: Secondary | ICD-10-CM

## 2023-03-18 MED ORDER — ACETAMINOPHEN 325 MG PO TABS: 650.0000 mg | ORAL_TABLET | Freq: Once | ORAL | Status: DC

## 2023-03-18 MED ORDER — SODIUM CHLORIDE 0.9 % IV SOLN
200.0000 mg | Freq: Once | INTRAVENOUS | Status: AC
  Administered 2023-03-18: 200 mg via INTRAVENOUS
  Filled 2023-03-18: qty 10

## 2023-03-18 MED ORDER — DIPHENHYDRAMINE HCL 25 MG PO CAPS: 25.0000 mg | ORAL_CAPSULE | Freq: Once | ORAL | Status: DC

## 2023-03-18 NOTE — Telephone Encounter (Signed)
Requested medications are due for refill today.  yes  Requested medications are on the active medications list.  yes  Last refill. 08/17/2022 #30 1rf  Future visit scheduled.   yes  Notes to clinic.  Refill not delegated.    Requested Prescriptions  Pending Prescriptions Disp Refills   ARIPiprazole (ABILIFY) 5 MG tablet [Pharmacy Med Name: ARIPIPRAZOLE 5 MG TABLET] 30 tablet 0    Sig: Take 1 tablet by mouth once daily     Not Delegated - Psychiatry:  Antipsychotics - Second Generation (Atypical) - aripiprazole Failed - 03/18/2023  4:27 PM      Failed - This refill cannot be delegated      Failed - Valid encounter within last 6 months    Recent Outpatient Visits           1 year ago Change in stool   [facility] Pickard, Priscille Heidelberg, MD   1 year ago Hypercalcemia   [facility] Pickard, Priscille Heidelberg, MD   1 year ago Primary hypertension   [facility] Tanya Nones, Priscille Heidelberg, MD   1 year ago Microcytosis   [facility] Pickard, Priscille Heidelberg, MD   1 year ago Nausea   [facility] Donita Brooks, MD              Failed - Lipid Panel in normal range within the last 12 months    Cholesterol, Total  Date Value Ref Range Status  09/15/2022 236 (H) 100 - 199 mg/dL Final   LDL Cholesterol (Calc)  Date Value Ref Range Status  06/17/2022 146 (H) mg/dL (calc) Final    Comment:    Reference range: <100 . Desirable range <100 mg/dL for primary prevention;   <70 mg/dL for patients with CHD or diabetic patients  with > or = 2 CHD risk factors. Marland Kitchen LDL-C is now calculated using the Martin-Hopkins  calculation, which is a validated novel method providing  better accuracy than the Friedewald equation in the  estimation of LDL-C.  Horald Pollen et al. Lenox Ahr. 1191;478(29): 2061-2068  (http://education.[facility].com/faq/FAQ164)    LDL Chol Calc (NIH)  Date Value Ref Range Status  09/15/2022 160 (H) 0 - 99  mg/dL Final   HDL  Date Value Ref Range Status  09/15/2022 53 >39 mg/dL Final   Triglycerides  Date Value Ref Range Status  09/15/2022 129 0 - 149 mg/dL Final         Passed - TSH in normal range and within 360 days    TSH  Date Value Ref Range Status  06/11/2022 2.360 0.450 - 4.500 uIU/mL Final         Passed - Completed PHQ-2 or PHQ-9 in the last 360 days      Passed - Last BP in normal range    BP Readings from Last 1 Encounters:  03/18/23 114/69         Passed - Last Heart Rate in normal range    Pulse Readings from Last 1 Encounters:  03/18/23 64         Passed - CBC within normal limits and completed in the last 12 months    WBC  Date Value Ref Range Status  03/04/2023 7.9 3.8 - 10.8 Thousand/uL Final   RBC  Date Value Ref Range Status  03/04/2023 4.67 3.80 - 5.10 Million/uL Final   Hemoglobin  Date Value Ref Range Status  03/04/2023 10.4 (L) 11.7 - 15.5 g/dL Final  HCT  Date Value Ref Range Status  03/04/2023 35.4 35.0 - 45.0 % Final   MCHC  Date Value Ref Range Status  03/04/2023 29.4 (L) 32.0 - 36.0 g/dL Final   [facility]  Date Value Ref Range Status  03/04/2023 22.3 (L) 27.0 - 33.0 pg Final   MCV  Date Value Ref Range Status  03/04/2023 75.8 (L) 80.0 - 100.0 fL Final   No results found for: "PLTCOUNTKUC", "LABPLAT", "POCPLA" RDW  Date Value Ref Range Status  03/04/2023 26.8 (H) 11.0 - 15.0 % Final         Passed - CMP within normal limits and completed in the last 12 months    Albumin  Date Value Ref Range Status  02/17/2023 4.2 3.5 - 5.0 g/dL Final  16/07/9603 4.6 3.6 - 4.8 g/dL Final   Alkaline Phosphatase  Date Value Ref Range Status  02/17/2023 92 38 - 126 U/L Final   Alkaline phosphatase (APISO)  Date Value Ref Range Status  03/12/2022 137 37 - 153 U/L Final   ALT  Date Value Ref Range Status  02/17/2023 11 0 - 44 U/L Final   AST  Date Value Ref Range Status  02/17/2023 12 (L) 15 - 41 U/L Final   BUN  Date Value Ref  Range Status  03/04/2023 18 7 - 25 mg/dL Final  54/06/8118 14 8 - 27 mg/dL Final   Calcium  Date Value Ref Range Status  03/04/2023 11.0 (H) 8.6 - 10.4 mg/dL Final   Calcium, Ion  Date Value Ref Range Status  02/18/2023 1.20 1.15 - 1.40 mmol/L Final   CO2  Date Value Ref Range Status  03/04/2023 26 20 - 32 mmol/L Final   TCO2  Date Value Ref Range Status  02/18/2023 25 22 - 32 mmol/L Final   Creat  Date Value Ref Range Status  03/04/2023 0.90 0.60 - 1.00 mg/dL Final   Glucose, Bld  Date Value Ref Range Status  03/04/2023 99 65 - 99 mg/dL Final    Comment:    .            Fasting reference interval .    Potassium  Date Value Ref Range Status  03/04/2023 4.8 3.5 - 5.3 mmol/L Final   Sodium  Date Value Ref Range Status  03/04/2023 141 135 - 146 mmol/L Final  08/13/2020 141 134 - 144 mmol/L Final   Total Bilirubin  Date Value Ref Range Status  02/17/2023 0.3 0.3 - 1.2 mg/dL Final   Bilirubin Total  Date Value Ref Range Status  11/07/2017 0.4 0.0 - 1.2 mg/dL Final   Protein, ur  Date Value Ref Range Status  04/01/2020 1+ (A) NEGATIVE Final   Total Protein  Date Value Ref Range Status  02/17/2023 6.3 (L) 6.5 - 8.1 g/dL Final  14/78/2956 6.9 6.0 - 8.5 g/dL Final   GFR, Est African American  Date Value Ref Range Status  11/04/2020 70 > OR = 60 mL/min/1.19m2 Final   eGFR  Date Value Ref Range Status  03/04/2023 68 > OR = 60 mL/min/1.67m2 Final   GFR, Est Non African American  Date Value Ref Range Status  11/04/2020 60 > OR = 60 mL/min/1.18m2 Final   GFR, Estimated  Date Value Ref Range Status  02/20/2023 >60 >60 mL/min Final    Comment:    (NOTE) Calculated using the CKD-EPI Creatinine Equation (2021)

## 2023-03-18 NOTE — Progress Notes (Signed)
Diagnosis: Iron Deficiency Anemia  Provider:  Chilton Greathouse MD  Procedure: IV Infusion  IV Type: Peripheral, IV Location: R Antecubital  Venofer (Iron Sucrose), Dose: 200 mg  Infusion Start Time: 1447  Infusion Stop Time: 1504  Post Infusion IV Care: Peripheral IV Discontinued  Discharge: Condition: Good, Destination: Home . AVS Declined  Performed by:  Marlow Baars Pilkington-Burchett, RN

## 2023-03-21 ENCOUNTER — Ambulatory Visit (INDEPENDENT_AMBULATORY_CARE_PROVIDER_SITE_OTHER): Payer: Medicare HMO

## 2023-03-21 VITALS — BP 132/76 | HR 63 | Temp 98.7°F | Resp 18 | Ht 70.0 in | Wt 227.4 lb

## 2023-03-21 DIAGNOSIS — K922 Gastrointestinal hemorrhage, unspecified: Secondary | ICD-10-CM

## 2023-03-21 DIAGNOSIS — D500 Iron deficiency anemia secondary to blood loss (chronic): Secondary | ICD-10-CM | POA: Diagnosis not present

## 2023-03-21 MED ORDER — ACETAMINOPHEN 325 MG PO TABS: 650.0000 mg | ORAL_TABLET | Freq: Once | ORAL | Status: DC

## 2023-03-21 MED ORDER — DIPHENHYDRAMINE HCL 25 MG PO CAPS: 25.0000 mg | ORAL_CAPSULE | Freq: Once | ORAL | Status: DC

## 2023-03-21 MED ORDER — SODIUM CHLORIDE 0.9 % IV SOLN
200.0000 mg | Freq: Once | INTRAVENOUS | Status: AC
  Administered 2023-03-21: 200 mg via INTRAVENOUS
  Filled 2023-03-21: qty 10

## 2023-03-21 NOTE — Progress Notes (Signed)
Diagnosis: Iron Deficiency Anemia  Provider:  Chilton Greathouse MD  Procedure: IV Infusion  IV Type: Peripheral, IV Location: R Antecubital  Venofer (Iron Sucrose), Dose: 200 mg  Infusion Start Time: 1438  Infusion Stop Time: 1457  Post Infusion IV Care: Peripheral IV Discontinued  Discharge: Condition: Good, Destination: Home . AVS Declined  Performed by:  Marlow Baars Pilkington-Burchett, RN

## 2023-03-29 ENCOUNTER — Other Ambulatory Visit: Payer: Self-pay | Admitting: Family Medicine

## 2023-03-29 DIAGNOSIS — F333 Major depressive disorder, recurrent, severe with psychotic symptoms: Secondary | ICD-10-CM

## 2023-03-29 DIAGNOSIS — F324 Major depressive disorder, single episode, in partial remission: Secondary | ICD-10-CM

## 2023-03-31 ENCOUNTER — Other Ambulatory Visit: Payer: Medicare HMO

## 2023-03-31 DIAGNOSIS — D500 Iron deficiency anemia secondary to blood loss (chronic): Secondary | ICD-10-CM | POA: Diagnosis not present

## 2023-03-31 DIAGNOSIS — K269 Duodenal ulcer, unspecified as acute or chronic, without hemorrhage or perforation: Secondary | ICD-10-CM

## 2023-03-31 DIAGNOSIS — K922 Gastrointestinal hemorrhage, unspecified: Secondary | ICD-10-CM

## 2023-03-31 DIAGNOSIS — D62 Acute posthemorrhagic anemia: Secondary | ICD-10-CM

## 2023-04-01 ENCOUNTER — Encounter: Payer: Self-pay | Admitting: Family Medicine

## 2023-04-01 LAB — CBC WITH DIFFERENTIAL/PLATELET
Absolute Monocytes: 458 cells/uL (ref 200–950)
Basophils Absolute: 60 cells/uL (ref 0–200)
Basophils Relative: 0.8 %
Eosinophils Absolute: 98 cells/uL (ref 15–500)
Eosinophils Relative: 1.3 %
HCT: 42 % (ref 35.0–45.0)
Hemoglobin: 12.7 g/dL (ref 11.7–15.5)
Lymphs Abs: 1875 cells/uL (ref 850–3900)
MCH: 25 pg — ABNORMAL LOW (ref 27.0–33.0)
MCHC: 30.2 g/dL — ABNORMAL LOW (ref 32.0–36.0)
MCV: 82.8 fL (ref 80.0–100.0)
MPV: 9.4 fL (ref 7.5–12.5)
Monocytes Relative: 6.1 %
Neutro Abs: 5010 cells/uL (ref 1500–7800)
Neutrophils Relative %: 66.8 %
Platelets: 326 10*3/uL (ref 140–400)
RBC: 5.07 10*6/uL (ref 3.80–5.10)
RDW: 26.5 % — ABNORMAL HIGH (ref 11.0–15.0)
Total Lymphocyte: 25 %
WBC: 7.5 10*3/uL (ref 3.8–10.8)

## 2023-04-01 LAB — IRON,TIBC AND FERRITIN PANEL
%SAT: 21 % (calc) (ref 16–45)
Ferritin: 87 ng/mL (ref 16–288)
Iron: 71 ug/dL (ref 45–160)
TIBC: 345 mcg/dL (calc) (ref 250–450)

## 2023-04-02 ENCOUNTER — Encounter: Payer: Self-pay | Admitting: Family Medicine

## 2023-04-04 ENCOUNTER — Other Ambulatory Visit: Payer: Self-pay

## 2023-04-04 DIAGNOSIS — K922 Gastrointestinal hemorrhage, unspecified: Secondary | ICD-10-CM

## 2023-04-04 DIAGNOSIS — K269 Duodenal ulcer, unspecified as acute or chronic, without hemorrhage or perforation: Secondary | ICD-10-CM

## 2023-04-04 MED ORDER — PANTOPRAZOLE SODIUM 40 MG PO TBEC
40.0000 mg | DELAYED_RELEASE_TABLET | Freq: Two times a day (BID) | ORAL | 1 refills | Status: DC
Start: 2023-04-04 — End: 2023-08-30

## 2023-04-06 DIAGNOSIS — H401421 Capsular glaucoma with pseudoexfoliation of lens, left eye, mild stage: Secondary | ICD-10-CM | POA: Diagnosis not present

## 2023-04-06 DIAGNOSIS — H2513 Age-related nuclear cataract, bilateral: Secondary | ICD-10-CM | POA: Diagnosis not present

## 2023-04-06 DIAGNOSIS — H524 Presbyopia: Secondary | ICD-10-CM | POA: Diagnosis not present

## 2023-04-06 DIAGNOSIS — H353130 Nonexudative age-related macular degeneration, bilateral, stage unspecified: Secondary | ICD-10-CM | POA: Diagnosis not present

## 2023-04-06 DIAGNOSIS — H5213 Myopia, bilateral: Secondary | ICD-10-CM | POA: Diagnosis not present

## 2023-04-06 DIAGNOSIS — H33301 Unspecified retinal break, right eye: Secondary | ICD-10-CM | POA: Diagnosis not present

## 2023-04-06 DIAGNOSIS — H52222 Regular astigmatism, left eye: Secondary | ICD-10-CM | POA: Diagnosis not present

## 2023-04-08 ENCOUNTER — Other Ambulatory Visit: Payer: Medicare HMO

## 2023-04-12 ENCOUNTER — Ambulatory Visit: Payer: Medicare HMO | Admitting: Family Medicine

## 2023-04-13 DIAGNOSIS — Z0100 Encounter for examination of eyes and vision without abnormal findings: Secondary | ICD-10-CM | POA: Diagnosis not present

## 2023-04-15 ENCOUNTER — Encounter (HOSPITAL_COMMUNITY): Payer: Self-pay

## 2023-04-15 NOTE — Patient Instructions (Addendum)
SURGICAL WAITING ROOM VISITATION Patients having surgery or a procedure may have no more than 2 support people in the waiting area - these visitors may rotate.    Children under the age of 48 must have an adult with them who is not the patient.  If the patient needs to stay at the hospital during part of their recovery, the visitor guidelines for inpatient rooms apply. Pre-op nurse will coordinate an appropriate time for 1 support person to accompany patient in pre-op.  This support person may not rotate.    Please refer to the [facility] website for the visitor guidelines for Inpatients (after your surgery is over and you are in a regular room).       Your procedure is scheduled on: 05-16-23   Report to [facility] Main Entrance    Report to admitting at 5:15 AM   Call this number if you have problems the morning of surgery [phone]   Do not eat food :After Midnight.   After Midnight you may have the following liquids until 4:30 AM DAY OF SURGERY  Water Non-Citrus Juices (without pulp, NO RED-Apple, White grape, White cranberry) Black Coffee (NO MILK/CREAM OR CREAMERS, sugar ok)  Clear Tea (NO MILK/CREAM OR CREAMERS, sugar ok) regular and decaf                             Plain Jell-O (NO RED)                                           Fruit ices (not with fruit pulp, NO RED)                                     Popsicles (NO RED)                                                               Sports drinks like Gatorade (NO RED)                If you have questions, please contact your surgeon's office.   FOLLOW ANY ADDITIONAL PRE OP INSTRUCTIONS YOU RECEIVED FROM YOUR SURGEON'S OFFICE!!!     Oral Hygiene is also important to reduce your risk of infection.                                    Remember - BRUSH YOUR TEETH THE MORNING OF SURGERY WITH YOUR REGULAR TOOTHPASTE   Do NOT smoke after Midnight   Take these medicines the morning of surgery with A SIP OF  WATER:   AripiPrazole  Diltiazem  Allegra  Pantoprazole  Sertraline  Tylenol if needed  Bring CPAP mask and tubing day of surgery.                              You may not have any metal on your body including hair pins, jewelry, and body piercing  Do not wear make-up, lotions, powders, perfumes or deodorant  Do not wear nail polish including gel and S&S, artificial/acrylic nails, or any other type of covering on natural nails including finger and toenails. If you have artificial nails, gel coating, etc. that needs to be removed by a nail salon please have this removed prior to surgery or surgery may need to be canceled/ delayed if the surgeon/ anesthesia feels like they are unable to be safely monitored.   Do not shave  48 hours prior to surgery.       Do not bring valuables to the hospital. [facility] IS NOT RESPONSIBLE   FOR VALUABLES.   Contacts, dentures or bridgework may not be worn into surgery.   DO NOT BRING YOUR HOME MEDICATIONS TO THE HOSPITAL. PHARMACY WILL DISPENSE MEDICATIONS LISTED ON YOUR MEDICATION LIST TO YOU DURING YOUR ADMISSION IN THE HOSPITAL!    Patients discharged on the day of surgery will not be allowed to drive home.  Someone NEEDS to stay with you for the first 24 hours after anesthesia.   Special Instructions: Bring a copy of your healthcare power of attorney and living will documents the day of surgery if you haven't scanned them before.              Please read over the following fact sheets you were given: IF YOU HAVE QUESTIONS ABOUT YOUR PRE-OP INSTRUCTIONS PLEASE CALL [phone] Gwen  If you received a COVID test during your pre-op visit  it is requested that you wear a mask when out in public, stay away from anyone that may not be feeling well and notify your surgeon if you develop symptoms. If you test positive for Covid or have been in contact with anyone that has tested positive in the last 10 days please notify you surgeon.  [facility] - Preparing for Surgery Before surgery, you can play an important role.  Because skin is not sterile, your skin needs to be as free of germs as possible.  You can reduce the number of germs on your skin by washing with CHG (chlorahexidine gluconate) soap before surgery.  CHG is an antiseptic cleaner which kills germs and bonds with the skin to continue killing germs even after washing. Please DO NOT use if you have an allergy to CHG or antibacterial soaps.  If your skin becomes reddened/irritated stop using the CHG and inform your nurse when you arrive at Short Stay. Do not shave (including legs and underarms) for at least 48 hours prior to the first CHG shower.  You may shave your face/neck.  Please follow these instructions carefully:  1.  Shower with CHG Soap the night before surgery and the  morning of surgery.  2.  If you choose to wash your hair, wash your hair first as usual with your normal  shampoo.  3.  After you shampoo, rinse your hair and body thoroughly to remove the shampoo.                             4.  Use CHG as you would any other liquid soap.  You can apply chg directly to the skin and wash.  Gently with a scrungie or clean washcloth.  5.  Apply the CHG Soap to your body ONLY FROM THE NECK DOWN.   Do   not use on face/ open  Wound or open sores. Avoid contact with eyes, ears mouth and   genitals (private parts).                       Wash face,  Genitals (private parts) with your normal soap.             6.  Wash thoroughly, paying special attention to the area where your    surgery  will be performed.  7.  Thoroughly rinse your body with warm water from the neck down.  8.  DO NOT shower/wash with your normal soap after using and rinsing off the CHG Soap.                9.  Pat yourself dry with a clean towel.            10.  Wear clean pajamas.            11.  Place clean sheets on your bed the night of your first shower and do not  sleep with  pets. Day of Surgery : Do not apply any lotions/deodorants the morning of surgery.  Please wear clean clothes to the hospital/surgery center.  FAILURE TO FOLLOW THESE INSTRUCTIONS MAY RESULT IN THE CANCELLATION OF YOUR SURGERY  PATIENT SIGNATURE_________________________________  NURSE SIGNATURE__________________________________  ________________________________________________________________________

## 2023-04-15 NOTE — Progress Notes (Signed)
COVID Vaccine Completed:  Yes  Date of COVID positive in last 90 days:  PCP - Lynnea Ferrier, MD Cardiologist - Laurance Flatten, MD Pulmonologist - Chilton Greathouse, MD  Chest x-ray -  EKG - 02-17-23 Epic Stress Test - 09-11-20 Epic ECHO - 08-19-22 Epic Cardiac Cath -  Pacemaker/ICD device last checked: Spinal Cord Stimulator: Long Term Monitor - 09-02-20 Epic   Bowel Prep -   Sleep Study - Yes, +sleep apnea CPAP -   Fasting Blood Sugar -  Checks Blood Sugar _____ times a day  Last dose of GLP1 agonist-  N/A GLP1 instructions:  N/A   Last dose of SGLT-2 inhibitors-  N/A SGLT-2 instructions: N/A   Blood Thinner Instructions:  Time Aspirin Instructions: Last Dose:  Activity level:  Can go up a flight of stairs and perform activities of daily living without stopping and without symptoms of chest pain or shortness of breath.  Able to exercise without symptoms  Unable to go up a flight of stairs without symptoms of     Anesthesia review:  Cardiac eval for palpitations and SOB.  Concern for POTS  Patient denies shortness of breath, fever, cough and chest pain at PAT appointment  Patient verbalized understanding of instructions that were given to them at the PAT appointment. Patient was also instructed that they will need to review over the PAT instructions again at home before surgery.

## 2023-04-18 ENCOUNTER — Other Ambulatory Visit: Payer: Medicare HMO

## 2023-04-21 ENCOUNTER — Encounter (HOSPITAL_COMMUNITY)
Admission: RE | Admit: 2023-04-21 | Discharge: 2023-04-21 | Disposition: A | Discharge status: Home / Self Care | Payer: Medicare HMO | Source: Ambulatory Visit | Attending: Surgery | Admitting: Surgery

## 2023-04-21 ENCOUNTER — Other Ambulatory Visit: Payer: Self-pay

## 2023-04-21 ENCOUNTER — Other Ambulatory Visit: Payer: Self-pay | Admitting: Family Medicine

## 2023-04-21 ENCOUNTER — Encounter (HOSPITAL_COMMUNITY): Payer: Self-pay

## 2023-04-21 VITALS — BP 142/81 | HR 68 | Temp 99.1°F | Resp 20 | Ht 70.0 in | Wt 230.2 lb

## 2023-04-21 DIAGNOSIS — D649 Anemia, unspecified: Secondary | ICD-10-CM | POA: Diagnosis not present

## 2023-04-21 DIAGNOSIS — F333 Major depressive disorder, recurrent, severe with psychotic symptoms: Secondary | ICD-10-CM

## 2023-04-21 DIAGNOSIS — Z01812 Encounter for preprocedural laboratory examination: Secondary | ICD-10-CM | POA: Insufficient documentation

## 2023-04-21 DIAGNOSIS — I10 Essential (primary) hypertension: Secondary | ICD-10-CM | POA: Diagnosis not present

## 2023-04-21 DIAGNOSIS — F324 Major depressive disorder, single episode, in partial remission: Secondary | ICD-10-CM

## 2023-04-21 HISTORY — DX: Primary hyperparathyroidism: E21.0

## 2023-04-21 HISTORY — DX: Family history of other specified conditions: Z84.89

## 2023-04-21 LAB — BASIC METABOLIC PANEL
Anion gap: 8 (ref 5–15)
BUN: 17 mg/dL (ref 8–23)
CO2: 25 mmol/L (ref 22–32)
Calcium: 10.5 mg/dL — ABNORMAL HIGH (ref 8.9–10.3)
Chloride: 105 mmol/L (ref 98–111)
Creatinine, Ser: 0.75 mg/dL (ref 0.44–1.00)
GFR, Estimated: >60 mL/min (ref >60)
Glucose, Bld: 99 mg/dL (ref 70–99)
Potassium: 4.8 mmol/L (ref 3.5–5.1)
Sodium: 138 mmol/L (ref 135–145)

## 2023-04-21 LAB — CBC
HCT: 45.5 % (ref 36.0–46.0)
Hemoglobin: 14.2 g/dL (ref 12.0–15.0)
MCH: 26.7 pg (ref 26.0–34.0)
MCHC: 31.2 g/dL (ref 30.0–36.0)
MCV: 85.7 fL (ref 80.0–100.0)
Platelets: 296 10*3/uL (ref 150–400)
RBC: 5.31 MIL/uL — ABNORMAL HIGH (ref 3.87–5.11)
RDW: 23.6 % — ABNORMAL HIGH (ref 11.5–15.5)
WBC: 8 10*3/uL (ref 4.0–10.5)
nRBC: 0 % (ref 0.0–0.2)

## 2023-04-22 NOTE — Telephone Encounter (Signed)
Requested medications are due for refill today.  yes  Requested medications are on the active medications list.  yes  Last refill. 03/24/2023 #30 0 rf  Future visit scheduled.   no  Notes to clinic.  Refill not delegated.    Requested Prescriptions  Pending Prescriptions Disp Refills   ARIPiprazole (ABILIFY) 5 MG tablet [Pharmacy Med Name: ARIPIPRAZOLE 5 MG TABLET] 30 tablet 0    Sig: Take 1 tablet by mouth once daily     Not Delegated - Psychiatry:  Antipsychotics - Second Generation (Atypical) - aripiprazole Failed - 04/21/2023  9:19 PM      Failed - This refill cannot be delegated      Failed - Last BP in normal range    BP Readings from Last 1 Encounters:  04/21/23 (!) 142/81         Failed - Valid encounter within last 6 months    Recent Outpatient Visits           1 year ago Change in stool   [facility] Pickard, Priscille Heidelberg, MD   1 year ago Hypercalcemia   [facility] Tanya Nones, Priscille Heidelberg, MD   1 year ago Primary hypertension   [facility] Tanya Nones, Priscille Heidelberg, MD   1 year ago Microcytosis   [facility] Pickard, Priscille Heidelberg, MD   1 year ago Nausea   [facility] Donita Brooks, MD              Failed - Lipid Panel in normal range within the last 12 months    Cholesterol, Total  Date Value Ref Range Status  09/15/2022 236 (H) 100 - 199 mg/dL Final   LDL Cholesterol (Calc)  Date Value Ref Range Status  06/17/2022 146 (H) mg/dL (calc) Final    Comment:    Reference range: <100 . Desirable range <100 mg/dL for primary prevention;   <70 mg/dL for patients with CHD or diabetic patients  with > or = 2 CHD risk factors. Marland Kitchen LDL-C is now calculated using the Martin-Hopkins  calculation, which is a validated novel method providing  better accuracy than the Friedewald equation in the  estimation of LDL-C.  Horald Pollen et al. Lenox Ahr. 1610;960(45): 2061-2068   (http://education.[facility].com/faq/FAQ164)    LDL Chol Calc (NIH)  Date Value Ref Range Status  09/15/2022 160 (H) 0 - 99 mg/dL Final   HDL  Date Value Ref Range Status  09/15/2022 53 >39 mg/dL Final   Triglycerides  Date Value Ref Range Status  09/15/2022 129 0 - 149 mg/dL Final         Passed - TSH in normal range and within 360 days    TSH  Date Value Ref Range Status  06/11/2022 2.360 0.450 - 4.500 uIU/mL Final         Passed - Completed PHQ-2 or PHQ-9 in the last 360 days      Passed - Last Heart Rate in normal range    Pulse Readings from Last 1 Encounters:  04/21/23 68         Passed - CBC within normal limits and completed in the last 12 months    WBC  Date Value Ref Range Status  04/21/2023 8.0 4.0 - 10.5 K/uL Final   RBC  Date Value Ref Range Status  04/21/2023 5.31 (H) 3.87 - 5.11 MIL/uL Final   Hemoglobin  Date Value Ref Range Status  04/21/2023 14.2 12.0 - 15.0  g/dL Final   HCT  Date Value Ref Range Status  04/21/2023 45.5 36.0 - 46.0 % Final   MCHC  Date Value Ref Range Status  04/21/2023 31.2 30.0 - 36.0 g/dL Final   [facility]  Date Value Ref Range Status  04/21/2023 26.7 26.0 - 34.0 pg Final   MCV  Date Value Ref Range Status  04/21/2023 85.7 80.0 - 100.0 fL Final   No results found for: "PLTCOUNTKUC", "LABPLAT", "POCPLA" RDW  Date Value Ref Range Status  04/21/2023 23.6 (H) 11.5 - 15.5 % Final         Passed - CMP within normal limits and completed in the last 12 months    Albumin  Date Value Ref Range Status  02/17/2023 4.2 3.5 - 5.0 g/dL Final  62/95/2841 4.6 3.6 - 4.8 g/dL Final   Alkaline Phosphatase  Date Value Ref Range Status  02/17/2023 92 38 - 126 U/L Final   Alkaline phosphatase (APISO)  Date Value Ref Range Status  03/12/2022 137 37 - 153 U/L Final   ALT  Date Value Ref Range Status  02/17/2023 11 0 - 44 U/L Final   AST  Date Value Ref Range Status  02/17/2023 12 (L) 15 - 41 U/L Final   BUN  Date  Value Ref Range Status  04/21/2023 17 8 - 23 mg/dL Final  32/44/0102 14 8 - 27 mg/dL Final   Calcium  Date Value Ref Range Status  04/21/2023 10.5 (H) 8.9 - 10.3 mg/dL Final   Calcium, Ion  Date Value Ref Range Status  02/18/2023 1.20 1.15 - 1.40 mmol/L Final   CO2  Date Value Ref Range Status  04/21/2023 25 22 - 32 mmol/L Final   TCO2  Date Value Ref Range Status  02/18/2023 25 22 - 32 mmol/L Final   Creat  Date Value Ref Range Status  03/04/2023 0.90 0.60 - 1.00 mg/dL Final   Creatinine, Ser  Date Value Ref Range Status  04/21/2023 0.75 0.44 - 1.00 mg/dL Final   Glucose, Bld  Date Value Ref Range Status  04/21/2023 99 70 - 99 mg/dL Final    Comment:    Glucose reference range applies only to samples taken after fasting for at least 8 hours.   Potassium  Date Value Ref Range Status  04/21/2023 4.8 3.5 - 5.1 mmol/L Final   Sodium  Date Value Ref Range Status  04/21/2023 138 135 - 145 mmol/L Final  08/13/2020 141 134 - 144 mmol/L Final   Total Bilirubin  Date Value Ref Range Status  02/17/2023 0.3 0.3 - 1.2 mg/dL Final   Bilirubin Total  Date Value Ref Range Status  11/07/2017 0.4 0.0 - 1.2 mg/dL Final   Protein, ur  Date Value Ref Range Status  04/01/2020 1+ (A) NEGATIVE Final   Total Protein  Date Value Ref Range Status  02/17/2023 6.3 (L) 6.5 - 8.1 g/dL Final  72/53/6644 6.9 6.0 - 8.5 g/dL Final   GFR, Est African American  Date Value Ref Range Status  11/04/2020 70 > OR = 60 mL/min/1.38m2 Final   eGFR  Date Value Ref Range Status  03/04/2023 68 > OR = 60 mL/min/1.77m2 Final   GFR, Est Non African American  Date Value Ref Range Status  11/04/2020 60 > OR = 60 mL/min/1.68m2 Final   GFR, Estimated  Date Value Ref Range Status  04/21/2023 >60 >60 mL/min Final    Comment:    (NOTE) Calculated using the CKD-EPI Creatinine Equation (2021)

## 2023-04-25 ENCOUNTER — Other Ambulatory Visit: Payer: Self-pay | Admitting: Family Medicine

## 2023-04-25 DIAGNOSIS — F324 Major depressive disorder, single episode, in partial remission: Secondary | ICD-10-CM

## 2023-04-25 DIAGNOSIS — F333 Major depressive disorder, recurrent, severe with psychotic symptoms: Secondary | ICD-10-CM

## 2023-04-26 NOTE — Telephone Encounter (Signed)
Requested medication (s) are due for refill today: Yes  Requested medication (s) are on the active medication list:Yes  Last refill:  03/24/23  Future visit scheduled: No  Notes to clinic:  Unable to refill per protocol, cannot delegate. This is the 2nd request, first on 04/21/23.     Requested Prescriptions  Pending Prescriptions Disp Refills   ARIPiprazole (ABILIFY) 5 MG tablet [Pharmacy Med Name: Aripiprazole 5mg  Tablet] 30 tablet 0    Sig: Take 1 tablet by mouth daily.     Not Delegated - Psychiatry:  Antipsychotics - Second Generation (Atypical) - aripiprazole Failed - 04/25/2023  1:59 PM      Failed - This refill cannot be delegated      Failed - Last BP in normal range    BP Readings from Last 1 Encounters:  04/21/23 (!) 142/81         Failed - Valid encounter within last 6 months    Recent Outpatient Visits           1 year ago Change in stool   [facility] Medicine Pickard, Priscille Heidelberg, MD   1 year ago Hypercalcemia   [facility] Tanya Nones, Priscille Heidelberg, MD   1 year ago Primary hypertension   [facility] Tanya Nones, Priscille Heidelberg, MD   1 year ago Microcytosis   [facility] Pickard, Priscille Heidelberg, MD   1 year ago Nausea   [facility] Donita Brooks, MD              Failed - Lipid Panel in normal range within the last 12 months    Cholesterol, Total  Date Value Ref Range Status  09/15/2022 236 (H) 100 - 199 mg/dL Final   LDL Cholesterol (Calc)  Date Value Ref Range Status  06/17/2022 146 (H) mg/dL (calc) Final    Comment:    Reference range: <100 . Desirable range <100 mg/dL for primary prevention;   <70 mg/dL for patients with CHD or diabetic patients  with > or = 2 CHD risk factors. Marland Kitchen LDL-C is now calculated using the Martin-Hopkins  calculation, which is a validated novel method providing  better accuracy than the Friedewald equation in the  estimation of LDL-C.  Horald Pollen et al. Lenox Ahr.  1610;960(45): 2061-2068  (http://education.[facility].com/faq/FAQ164)    LDL Chol Calc (NIH)  Date Value Ref Range Status  09/15/2022 160 (H) 0 - 99 mg/dL Final   HDL  Date Value Ref Range Status  09/15/2022 53 >39 mg/dL Final   Triglycerides  Date Value Ref Range Status  09/15/2022 129 0 - 149 mg/dL Final         Passed - TSH in normal range and within 360 days    TSH  Date Value Ref Range Status  06/11/2022 2.360 0.450 - 4.500 uIU/mL Final         Passed - Completed PHQ-2 or PHQ-9 in the last 360 days      Passed - Last Heart Rate in normal range    Pulse Readings from Last 1 Encounters:  04/21/23 68         Passed - CBC within normal limits and completed in the last 12 months    WBC  Date Value Ref Range Status  04/21/2023 8.0 4.0 - 10.5 K/uL Final   RBC  Date Value Ref Range Status  04/21/2023 5.31 (H) 3.87 - 5.11 MIL/uL Final   Hemoglobin  Date Value Ref Range Status  04/21/2023 14.2 12.0 - 15.0 g/dL Final   HCT  Date Value Ref Range Status  04/21/2023 45.5 36.0 - 46.0 % Final   MCHC  Date Value Ref Range Status  04/21/2023 31.2 30.0 - 36.0 g/dL Final   [facility]  Date Value Ref Range Status  04/21/2023 26.7 26.0 - 34.0 pg Final   MCV  Date Value Ref Range Status  04/21/2023 85.7 80.0 - 100.0 fL Final   No results found for: "PLTCOUNTKUC", "LABPLAT", "POCPLA" RDW  Date Value Ref Range Status  04/21/2023 23.6 (H) 11.5 - 15.5 % Final         Passed - CMP within normal limits and completed in the last 12 months    Albumin  Date Value Ref Range Status  02/17/2023 4.2 3.5 - 5.0 g/dL Final  16/07/9603 4.6 3.6 - 4.8 g/dL Final   Alkaline Phosphatase  Date Value Ref Range Status  02/17/2023 92 38 - 126 U/L Final   Alkaline phosphatase (APISO)  Date Value Ref Range Status  03/12/2022 137 37 - 153 U/L Final   ALT  Date Value Ref Range Status  02/17/2023 11 0 - 44 U/L Final   AST  Date Value Ref Range Status  02/17/2023 12 (L) 15 - 41  U/L Final   BUN  Date Value Ref Range Status  04/21/2023 17 8 - 23 mg/dL Final  54/06/8118 14 8 - 27 mg/dL Final   Calcium  Date Value Ref Range Status  04/21/2023 10.5 (H) 8.9 - 10.3 mg/dL Final   Calcium, Ion  Date Value Ref Range Status  02/18/2023 1.20 1.15 - 1.40 mmol/L Final   CO2  Date Value Ref Range Status  04/21/2023 25 22 - 32 mmol/L Final   TCO2  Date Value Ref Range Status  02/18/2023 25 22 - 32 mmol/L Final   Creat  Date Value Ref Range Status  03/04/2023 0.90 0.60 - 1.00 mg/dL Final   Creatinine, Ser  Date Value Ref Range Status  04/21/2023 0.75 0.44 - 1.00 mg/dL Final   Glucose, Bld  Date Value Ref Range Status  04/21/2023 99 70 - 99 mg/dL Final    Comment:    Glucose reference range applies only to samples taken after fasting for at least 8 hours.   Potassium  Date Value Ref Range Status  04/21/2023 4.8 3.5 - 5.1 mmol/L Final   Sodium  Date Value Ref Range Status  04/21/2023 138 135 - 145 mmol/L Final  08/13/2020 141 134 - 144 mmol/L Final   Total Bilirubin  Date Value Ref Range Status  02/17/2023 0.3 0.3 - 1.2 mg/dL Final   Bilirubin Total  Date Value Ref Range Status  11/07/2017 0.4 0.0 - 1.2 mg/dL Final   Protein, ur  Date Value Ref Range Status  04/01/2020 1+ (A) NEGATIVE Final   Total Protein  Date Value Ref Range Status  02/17/2023 6.3 (L) 6.5 - 8.1 g/dL Final  14/78/2956 6.9 6.0 - 8.5 g/dL Final   GFR, Est African American  Date Value Ref Range Status  11/04/2020 70 > OR = 60 mL/min/1.40m2 Final   eGFR  Date Value Ref Range Status  03/04/2023 68 > OR = 60 mL/min/1.50m2 Final   GFR, Est Non African American  Date Value Ref Range Status  11/04/2020 60 > OR = 60 mL/min/1.98m2 Final   GFR, Estimated  Date Value Ref Range Status  04/21/2023 >60 >60 mL/min Final    Comment:    (NOTE) Calculated using the CKD-EPI  Creatinine Equation (2021)

## 2023-04-27 ENCOUNTER — Encounter: Payer: Self-pay | Admitting: Family Medicine

## 2023-04-27 ENCOUNTER — Telehealth: Payer: Self-pay | Admitting: Family Medicine

## 2023-04-27 ENCOUNTER — Other Ambulatory Visit: Payer: Self-pay

## 2023-04-27 NOTE — Telephone Encounter (Signed)
Prescription Request  04/27/2023  LOV: 03/04/2023  What is the name of the medication or equipment?   ARIPiprazole (ABILIFY) 5 MG tablet  **New script needed**  Have you contacted your pharmacy to request a refill? Yes   Which pharmacy would you like this sent to?  [facility] - [place], [place] - [address] [facility] [address] [place] [address] Phone: [phone] Fax: [phone]    Patient notified that their request is being sent to the clinical staff for review and that they should receive a response within 2 business days.   Please advise pharmacist.

## 2023-04-28 ENCOUNTER — Other Ambulatory Visit: Payer: Self-pay | Admitting: Family Medicine

## 2023-04-28 DIAGNOSIS — F333 Major depressive disorder, recurrent, severe with psychotic symptoms: Secondary | ICD-10-CM

## 2023-04-28 DIAGNOSIS — F324 Major depressive disorder, single episode, in partial remission: Secondary | ICD-10-CM

## 2023-04-28 MED ORDER — ARIPIPRAZOLE 5 MG PO TABS: 5.0000 mg | ORAL_TABLET | Freq: Every day | ORAL | 3 refills | Status: DC

## 2023-05-02 ENCOUNTER — Ambulatory Visit: Payer: Medicare HMO | Admitting: Physician Assistant

## 2023-05-04 ENCOUNTER — Encounter: Payer: Self-pay | Admitting: Family Medicine

## 2023-05-04 DIAGNOSIS — G4733 Obstructive sleep apnea (adult) (pediatric): Secondary | ICD-10-CM

## 2023-05-05 MED ORDER — MODAFINIL 200 MG PO TABS: ORAL_TABLET | ORAL | 0 refills | Status: DC

## 2023-05-15 ENCOUNTER — Encounter (HOSPITAL_COMMUNITY): Payer: Self-pay | Admitting: Surgery

## 2023-05-15 NOTE — H&P (Signed)
REFERRING PHYSICIAN: Purcell Nails, MD  PROVIDER: Macaulay Reicher Myra Rude, MD   Chief Complaint: New Consultation (Primary hyperparathyroidism)  History of Present Illness:  Patient is referred by Dr. Purcell Nails for surgical evaluation and management of suspected primary hyperparathyroidism. Patient had been noted to have elevated serum calcium levels dating back at least 2 years. Recent laboratory studies show an elevated serum calcium level of 10.8 with levels last year ranging as high as 11.0. Likewise her intact PTH was recently elevated at 120 with levels in the past being as high as 123. Patient underwent 24-hour urine collection for calcium which was markedly elevated at 563. 25-hydroxy vitamin D level is low normal at 31.3. Patient has not had any imaging studies. Patient notes multiple symptoms including chronic fatigue, bone and joint discomfort, depression, and mental fogginess. She denies nephrolithiasis. Bone density scanning has shown no evidence of osteoporosis. Patient has had no prior head or neck surgery. There is apparently a family history of parathyroid disease in the patient's mother who underwent surgery in [place] approximately 20 years ago. Her mother may or may not have also had adrenal surgery at 1 point. However, there is no history of other family members with endocrine neoplasms. Patient presents today for evaluation and further recommendations for management.  Review of Systems: A complete review of systems was obtained from the patient. I have reviewed this information and discussed as appropriate with the patient. See HPI as well for other ROS.  Review of Systems  Constitutional: Positive for malaise/fatigue.  Musculoskeletal: Positive for joint pain and myalgias.  Psychiatric/Behavioral: Positive for depression and memory loss.    Medical History: Past Medical History:  Diagnosis Date  Anemia  Anxiety  Arrhythmia  GERD (gastroesophageal  reflux disease)  Glaucoma (increased eye pressure)  Hyperlipidemia  Hypertension  Sleep apnea  Thyroid disease   Patient Active Problem List  Diagnosis  Primary hyperparathyroidism (CMS-HCC)   Past Surgical History:  Procedure Laterality Date  ovarian wedge resection  tonsilectomy    Allergies  Allergen Reactions  Asprin Ec Low Dose [Aspirin] Hives  Codeine Nausea   Current Outpatient Medications on File Prior to Visit  Medication Sig Dispense Refill  acetaminophen (TYLENOL) 500 MG tablet Take by mouth  ARIPiprazole (ABILIFY) 5 MG tablet Take 5 mg by mouth once daily  cetirizine (ZYRTEC) 10 MG tablet Take 10 mg by mouth once daily  dilTIAZem (CARDIZEM) 30 MG immediate release tablet Take 30 mg by mouth 2 (two) times daily  fexofenadine (ALLEGRA) 30 mg/5 mL suspension Take 30 mg by mouth 2 (two) times daily  magnesium sulfate/tta/rhubarb (MAGNESIUM-TARTARIC AC-RHUBARB ORAL) Take by mouth  modafiniL (PROVIGIL) 200 MG tablet Take by mouth  multivitamin with iron (CENTRUM ADULT COMPLETE) tablet Take 1 tablet by mouth once daily  sertraline (ZOLOFT) 50 MG tablet Take by mouth  Ubidecar/Fish Oil/Omega-3/VitE (CO Q10-FISH OIL-OMEGA 3-E ORAL) Take by mouth   No current facility-administered medications on file prior to visit.   Family History  Problem Relation Age of Onset  Hyperlipidemia (Elevated cholesterol) Mother  Thyroid disease Mother  Skin cancer Mother  High blood pressure (Hypertension) Mother  Obesity Father  High blood pressure (Hypertension) Father  Hyperlipidemia (Elevated cholesterol) Father  Breast cancer Sister    Social History   Tobacco Use  Smoking Status Former  Types: Cigarettes  Smokeless Tobacco Never    Social History   Socioeconomic History  Marital status: Married  Tobacco Use  Smoking status: Former  Types: Cigarettes  Smokeless tobacco: Never  Substance and Sexual Activity  Alcohol use: Yes  Drug use: Never   Objective:    Vitals:  BP: (!) 149/78  Pulse: 99  Temp: 36.8 C (98.2 F)  SpO2: 99%  Weight: (!) 109.1 kg (240 lb 9.6 oz)  Height: 177.8 cm (5\' 10" )   Body mass index is 34.52 kg/m.  Physical Exam   GENERAL APPEARANCE Comfortable, no acute issues Development: normal Gross deformities: none  SKIN Rash, lesions, ulcers: none Induration, erythema: none Nodules: none palpable  EYES Conjunctiva and lids: normal Pupils: equal and reactive  EARS, NOSE, MOUTH, THROAT External ears: no lesion or deformity External nose: no lesion or deformity Hearing: grossly normal  NECK Symmetric: yes Trachea: midline Thyroid: no palpable nodules in the thyroid bed  CHEST Respiratory effort: normal Retraction or accessory muscle use: no Breath sounds: normal bilaterally Rales, rhonchi, wheeze: none  CARDIOVASCULAR Auscultation: regular rhythm, normal rate Murmurs: none Pulses: radial pulse 2+ palpable Lower extremity edema: Bilateral ankle edema, mild  ABDOMEN Not assessed  GENITOURINARY/RECTAL Not assessed  MUSCULOSKELETAL Station and gait: normal Digits and nails: no clubbing or cyanosis Muscle strength: grossly normal all extremities Range of motion: grossly normal all extremities Deformity: none Essential tremor bilateral hands  LYMPHATIC Cervical: none palpable Supraclavicular: none palpable  PSYCHIATRIC Oriented to person, place, and time: yes Mood and affect: normal for situation Judgment and insight: appropriate for situation   Assessment and Plan:   Primary hyperparathyroidism (CMS-HCC)  Patient is referred by her endocrinologist, Dr. Purcell Nails, for surgical evaluation and recommendations regarding primary hyperparathyroidism.  Patient provided with a copy of "Parathyroid Surgery: Treatment for Your Parathyroid Gland Problem", published by [employer], 12 pages. Book reviewed and explained to patient during visit today.  Today we reviewed her clinical  history, and her laboratory studies. Patient has biochemical evidence of primary hyperparathyroidism. She is symptomatic. I would like to proceed with imaging studies to better evaluate for primary hyperparathyroidism. We will order an ultrasound examination of the neck as well as a nuclear medicine parathyroid scan with sestamibi. Hopefully both of these studies will be performed at the same visit. If the studies localize a parathyroid adenoma and confirm her diagnosis, then I believe she will be a good candidate for minimally invasive outpatient surgery. We discussed the procedure today. This would be done through a small incision on the neck at the site of the adenoma. This would be an outpatient procedure. If the studies do not localize the adenoma. Then we would plan to proceed with a 4D CT scan of the neck with parathyroid protocol. The patient understands.  We will order the above imaging studies and await the results for review. We will notify the patient once results are available and make plans for further management at that time.   Darnell Level, MD [facility] Surgery [facility] practice Office: [phone]

## 2023-05-16 ENCOUNTER — Other Ambulatory Visit: Payer: Self-pay

## 2023-05-16 ENCOUNTER — Ambulatory Visit (HOSPITAL_COMMUNITY): Payer: Medicare HMO | Admitting: Physician Assistant

## 2023-05-16 ENCOUNTER — Ambulatory Visit (HOSPITAL_COMMUNITY)
Admission: RE | Admit: 2023-05-16 | Discharge: 2023-05-16 | Disposition: A | Discharge status: Home / Self Care | Payer: Medicare HMO | Source: Ambulatory Visit | Attending: Surgery | Admitting: Surgery

## 2023-05-16 ENCOUNTER — Encounter (HOSPITAL_COMMUNITY): Payer: Self-pay | Admitting: Surgery

## 2023-05-16 ENCOUNTER — Ambulatory Visit (HOSPITAL_BASED_OUTPATIENT_CLINIC_OR_DEPARTMENT_OTHER): Payer: Medicare HMO | Admitting: Physician Assistant

## 2023-05-16 ENCOUNTER — Encounter (HOSPITAL_COMMUNITY)
Admission: RE | Disposition: A | Discharge status: Home / Self Care | Payer: Self-pay | Source: Ambulatory Visit | Attending: Surgery

## 2023-05-16 DIAGNOSIS — G4730 Sleep apnea, unspecified: Secondary | ICD-10-CM | POA: Insufficient documentation

## 2023-05-16 DIAGNOSIS — E213 Hyperparathyroidism, unspecified: Secondary | ICD-10-CM | POA: Diagnosis present

## 2023-05-16 DIAGNOSIS — F419 Anxiety disorder, unspecified: Secondary | ICD-10-CM | POA: Insufficient documentation

## 2023-05-16 DIAGNOSIS — M1990 Unspecified osteoarthritis, unspecified site: Secondary | ICD-10-CM | POA: Insufficient documentation

## 2023-05-16 DIAGNOSIS — F32A Depression, unspecified: Secondary | ICD-10-CM | POA: Diagnosis not present

## 2023-05-16 DIAGNOSIS — I10 Essential (primary) hypertension: Secondary | ICD-10-CM | POA: Diagnosis not present

## 2023-05-16 DIAGNOSIS — Z87891 Personal history of nicotine dependence: Secondary | ICD-10-CM | POA: Insufficient documentation

## 2023-05-16 DIAGNOSIS — E210 Primary hyperparathyroidism: Secondary | ICD-10-CM | POA: Insufficient documentation

## 2023-05-16 DIAGNOSIS — M797 Fibromyalgia: Secondary | ICD-10-CM | POA: Insufficient documentation

## 2023-05-16 DIAGNOSIS — E039 Hypothyroidism, unspecified: Secondary | ICD-10-CM | POA: Diagnosis not present

## 2023-05-16 DIAGNOSIS — D351 Benign neoplasm of parathyroid gland: Secondary | ICD-10-CM | POA: Insufficient documentation

## 2023-05-16 HISTORY — PX: PARATHYROIDECTOMY: SHX19

## 2023-05-16 SURGERY — PARATHYROIDECTOMY
Anesthesia: General | Site: Neck | Laterality: Left

## 2023-05-16 MED ORDER — EPHEDRINE 5 MG/ML INJ
INTRAVENOUS | Status: AC
  Filled 2023-05-16: qty 5

## 2023-05-16 MED ORDER — CHLORHEXIDINE GLUCONATE 0.12 % MT SOLN
15.0000 mL | Freq: Once | OROMUCOSAL | Status: AC
  Administered 2023-05-16: 15 mL via OROMUCOSAL

## 2023-05-16 MED ORDER — SUGAMMADEX SODIUM 200 MG/2ML IV SOLN
INTRAVENOUS | Status: DC | PRN
  Administered 2023-05-16: 200 mg via INTRAVENOUS

## 2023-05-16 MED ORDER — BUPIVACAINE HCL 0.25 % IJ SOLN
INTRAMUSCULAR | Status: AC
  Filled 2023-05-16: qty 1

## 2023-05-16 MED ORDER — MIDAZOLAM HCL 2 MG/2ML IJ SOLN
INTRAMUSCULAR | Status: AC
  Filled 2023-05-16: qty 2

## 2023-05-16 MED ORDER — PROPOFOL 10 MG/ML IV BOLUS
INTRAVENOUS | Status: DC | PRN
Start: 2023-05-16 — End: 2023-05-16
  Administered 2023-05-16: 150 mg via INTRAVENOUS
  Administered 2023-05-16: 50 mg via INTRAVENOUS

## 2023-05-16 MED ORDER — PROMETHAZINE HCL 25 MG/ML IJ SOLN: 6.2500 mg | INTRAMUSCULAR | Status: DC | PRN

## 2023-05-16 MED ORDER — LIDOCAINE HCL (PF) 2 % IJ SOLN
INTRAMUSCULAR | Status: AC
  Filled 2023-05-16: qty 5

## 2023-05-16 MED ORDER — OXYCODONE HCL 5 MG/5ML PO SOLN: 5.0000 mg | Freq: Once | ORAL | Status: DC | PRN

## 2023-05-16 MED ORDER — PHENYLEPHRINE HCL-NACL 20-0.9 MG/250ML-% IV SOLN
INTRAVENOUS | Status: DC | PRN
  Administered 2023-05-16: 25 ug/min via INTRAVENOUS

## 2023-05-16 MED ORDER — 0.9 % SODIUM CHLORIDE (POUR BTL) OPTIME
TOPICAL | Status: DC | PRN
  Administered 2023-05-16: 1000 mL

## 2023-05-16 MED ORDER — ONDANSETRON HCL 4 MG/2ML IJ SOLN
INTRAMUSCULAR | Status: AC
  Filled 2023-05-16: qty 2

## 2023-05-16 MED ORDER — HEMOSTATIC AGENTS (NO CHARGE) OPTIME
TOPICAL | Status: DC | PRN
  Administered 2023-05-16: 1 via TOPICAL

## 2023-05-16 MED ORDER — AMISULPRIDE (ANTIEMETIC) 5 MG/2ML IV SOLN: 10.0000 mg | Freq: Once | INTRAVENOUS | Status: DC | PRN

## 2023-05-16 MED ORDER — LACTATED RINGERS IV SOLN: INTRAVENOUS | Status: DC

## 2023-05-16 MED ORDER — HYDROMORPHONE HCL 1 MG/ML IJ SOLN
INTRAMUSCULAR | Status: AC
  Filled 2023-05-16: qty 1

## 2023-05-16 MED ORDER — HYDROMORPHONE HCL 1 MG/ML IJ SOLN
0.2500 mg | INTRAMUSCULAR | Status: DC | PRN
  Administered 2023-05-16: 0.5 mg via INTRAVENOUS

## 2023-05-16 MED ORDER — BUPIVACAINE HCL 0.25 % IJ SOLN
INTRAMUSCULAR | Status: DC | PRN
  Administered 2023-05-16: 10 mL

## 2023-05-16 MED ORDER — CHLORHEXIDINE GLUCONATE CLOTH 2 % EX PADS: 6.0000 | MEDICATED_PAD | Freq: Once | CUTANEOUS | Status: DC

## 2023-05-16 MED ORDER — DEXAMETHASONE SODIUM PHOSPHATE 10 MG/ML IJ SOLN
INTRAMUSCULAR | Status: AC
  Filled 2023-05-16: qty 1

## 2023-05-16 MED ORDER — ACETAMINOPHEN 10 MG/ML IV SOLN
INTRAVENOUS | Status: AC
  Filled 2023-05-16: qty 100

## 2023-05-16 MED ORDER — OXYCODONE HCL 5 MG PO TABS: 5.0000 mg | ORAL_TABLET | Freq: Once | ORAL | Status: DC | PRN

## 2023-05-16 MED ORDER — TRAMADOL HCL 50 MG PO TABS: 50.0000 mg | ORAL_TABLET | Freq: Four times a day (QID) | ORAL | 0 refills | Status: DC | PRN

## 2023-05-16 MED ORDER — LIDOCAINE 2% (20 MG/ML) 5 ML SYRINGE
INTRAMUSCULAR | Status: DC | PRN
  Administered 2023-05-16: 80 mg via INTRAVENOUS

## 2023-05-16 MED ORDER — DEXAMETHASONE SODIUM PHOSPHATE 10 MG/ML IJ SOLN
INTRAMUSCULAR | Status: DC | PRN
  Administered 2023-05-16: 10 mg via INTRAVENOUS

## 2023-05-16 MED ORDER — FENTANYL CITRATE (PF) 100 MCG/2ML IJ SOLN
INTRAMUSCULAR | Status: DC | PRN
  Administered 2023-05-16 (×2): 50 ug via INTRAVENOUS

## 2023-05-16 MED ORDER — PROPOFOL 10 MG/ML IV BOLUS
INTRAVENOUS | Status: AC
  Filled 2023-05-16: qty 20

## 2023-05-16 MED ORDER — CEFAZOLIN SODIUM-DEXTROSE 2-4 GM/100ML-% IV SOLN
2.0000 g | INTRAVENOUS | Status: AC
  Administered 2023-05-16: 2 g via INTRAVENOUS
  Filled 2023-05-16: qty 100

## 2023-05-16 MED ORDER — ROCURONIUM BROMIDE 10 MG/ML (PF) SYRINGE
PREFILLED_SYRINGE | INTRAVENOUS | Status: AC
  Filled 2023-05-16: qty 10

## 2023-05-16 MED ORDER — MIDAZOLAM HCL 5 MG/5ML IJ SOLN
INTRAMUSCULAR | Status: DC | PRN
  Administered 2023-05-16: 2 mg via INTRAVENOUS

## 2023-05-16 MED ORDER — ONDANSETRON HCL 4 MG/2ML IJ SOLN
INTRAMUSCULAR | Status: DC | PRN
Start: 2023-05-16 — End: 2023-05-16
  Administered 2023-05-16: 4 mg via INTRAVENOUS

## 2023-05-16 MED ORDER — ORAL CARE MOUTH RINSE: 15.0000 mL | Freq: Once | OROMUCOSAL | Status: AC

## 2023-05-16 MED ORDER — ACETAMINOPHEN 10 MG/ML IV SOLN
INTRAVENOUS | Status: DC | PRN
  Administered 2023-05-16: 1000 mg via INTRAVENOUS

## 2023-05-16 MED ORDER — FENTANYL CITRATE (PF) 100 MCG/2ML IJ SOLN
INTRAMUSCULAR | Status: AC
  Filled 2023-05-16: qty 2

## 2023-05-16 MED ORDER — EPHEDRINE SULFATE-NACL 50-0.9 MG/10ML-% IV SOSY
PREFILLED_SYRINGE | INTRAVENOUS | Status: DC | PRN
  Administered 2023-05-16: 5 mg via INTRAVENOUS

## 2023-05-16 MED ORDER — ROCURONIUM BROMIDE 10 MG/ML (PF) SYRINGE
PREFILLED_SYRINGE | INTRAVENOUS | Status: DC | PRN
  Administered 2023-05-16: 60 mg via INTRAVENOUS

## 2023-05-16 SURGICAL SUPPLY — 36 items
ADH SKN CLS APL DERMABOND .7 (GAUZE/BANDAGES/DRESSINGS) ×1
APL PRP STRL LF DISP 70% ISPRP (MISCELLANEOUS) ×1
ATTRACTOMAT 16X20 MAGNETIC DRP (DRAPES) ×1 IMPLANT
BAG COUNTER SPONGE SURGICOUNT (BAG) ×1 IMPLANT
BAG SPNG CNTER NS LX DISP (BAG) ×1
BLADE SURG 15 STRL LF DISP TIS (BLADE) ×1 IMPLANT
BLADE SURG 15 STRL SS (BLADE) ×1
CHLORAPREP W/TINT 26 (MISCELLANEOUS) ×1 IMPLANT
CLIP TI MEDIUM 6 (CLIP) ×2 IMPLANT
CLIP TI WIDE RED SMALL 6 (CLIP) ×2 IMPLANT
COVER SURGICAL LIGHT HANDLE (MISCELLANEOUS) ×1 IMPLANT
DERMABOND ADVANCED .7 DNX12 (GAUZE/BANDAGES/DRESSINGS) ×1 IMPLANT
DRAPE LAPAROTOMY T 98X78 PEDS (DRAPES) ×1 IMPLANT
DRAPE UTILITY XL STRL (DRAPES) ×1 IMPLANT
ELECT PENCIL ROCKER SW 15FT (MISCELLANEOUS) IMPLANT
ELECT REM PT RETURN 15FT ADLT (MISCELLANEOUS) ×1 IMPLANT
GAUZE 4X4 16PLY ~~LOC~~+RFID DBL (SPONGE) ×1 IMPLANT
GLOVE SURG ORTHO 8.0 STRL STRW (GLOVE) ×1 IMPLANT
GOWN STRL REUS W/ TWL XL LVL3 (GOWN DISPOSABLE) ×3 IMPLANT
GOWN STRL REUS W/TWL XL LVL3 (GOWN DISPOSABLE) ×3
HEMOSTAT SURGICEL 2X4 FIBR (HEMOSTASIS) ×1 IMPLANT
ILLUMINATOR WAVEGUIDE N/F (MISCELLANEOUS) IMPLANT
KIT BASIN OR (CUSTOM PROCEDURE TRAY) ×1 IMPLANT
KIT TURNOVER KIT A (KITS) IMPLANT
NDL HYPO 22X1.5 SAFETY MO (MISCELLANEOUS) ×1 IMPLANT
NEEDLE HYPO 22X1.5 SAFETY MO (MISCELLANEOUS) ×1 IMPLANT
PACK BASIC VI WITH GOWN DISP (CUSTOM PROCEDURE TRAY) ×1 IMPLANT
PENCIL SMOKE EVACUATOR (MISCELLANEOUS) ×1 IMPLANT
SHEARS HARMONIC 9CM CVD (BLADE) IMPLANT
SUT MNCRL AB 4-0 PS2 18 (SUTURE) ×1 IMPLANT
SUT VIC AB 3-0 SH 18 (SUTURE) ×1 IMPLANT
SYR BULB IRRIG 60ML STRL (SYRINGE) ×1 IMPLANT
SYR CONTROL 10ML LL (SYRINGE) ×1 IMPLANT
TOWEL OR 17X26 10 PK STRL BLUE (TOWEL DISPOSABLE) ×1 IMPLANT
TOWEL OR NON WOVEN STRL DISP B (DISPOSABLE) ×1 IMPLANT
TUBING CONNECTING 10 (TUBING) ×1 IMPLANT

## 2023-05-16 NOTE — Op Note (Signed)
OPERATIVE REPORT - PARATHYROIDECTOMY  Preoperative diagnosis: Primary hyperparathyroidism  Postop diagnosis: Same  Procedure: Left inferior minimally invasive parathyroidectomy  Surgeon:  Darnell Level, MD  Anesthesia: General endotracheal  Estimated blood loss: Minimal  Preparation: ChloraPrep  Indications: Patient is referred by Dr. Purcell Nails for surgical evaluation and management of suspected primary hyperparathyroidism. Patient had been noted to have elevated serum calcium levels dating back at least 2 years. Recent laboratory studies show an elevated serum calcium level of 10.8 with levels last year ranging as high as 11.0. Likewise her intact PTH was recently elevated at 120 with levels in the past being as high as 123. Patient underwent 24-hour urine collection for calcium which was markedly elevated at 563. 25-hydroxy vitamin D level is low normal at 31.3. 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 Patient notes multiple symptoms including chronic fatigue, bone and joint discomfort, depression, and mental fogginess. She denies nephrolithiasis. Bone density scanning has shown no evidence of osteoporosis. Patient has had no prior head or neck surgery. There is apparently a family history of parathyroid disease in the patient's mother who underwent surgery in [place] approximately 20 years ago. Her mother may or may not have also had adrenal surgery at 1 point. However, there is no history of other family members with endocrine neoplasms. Patient presents today for evaluation and further recommendations for management.   Procedure: The patient was prepared in the pre-operative holding area. The patient was brought to the operating room and placed in a supine position on the operating room table. Following administration of general anesthesia, the patient was positioned and then prepped and draped in the usual strict aseptic fashion. After ascertaining that an adequate level of  anesthesia been achieved, a neck incision was made with a #15 blade. Dissection was carried through subcutaneous tissues and platysma. Hemostasis was obtained with the electrocautery. Skin flaps were developed circumferentially and a Weitlander retractor was placed for exposure.  Strap muscles were incised in the midline. Strap muscles were reflected laterally exposing the thyroid lobe. With gentle blunt dissection the thyroid lobe was mobilized.  Dissection was carried posteriorly  and inferiorly and an enlarged parathyroid gland was identified. It was gently mobilized. Vascular structures were divided between small ligaclips. Care was taken to avoid the recurrent laryngeal nerve. The parathyroid gland was completely excised. It was submitted to pathology where frozen section confirmed hypercellular parathyroid tissue consistent with adenoma.  Neck was irrigated with warm saline and good hemostasis was noted. Fibrillar was placed in the operative field. Strap muscles were approximated in the midline with interrupted 3-0 Vicryl sutures. Platysma was closed with interrupted 3-0 Vicryl sutures. Marcaine was infiltrated circumferentially. Skin was closed with a running 4-0 Monocryl subcuticular suture. Wound was washed and dried and Dermabond was applied. Patient was awakened from anesthesia and brought to the recovery room. The patient tolerated the procedure well.   Darnell Level, MD [facility] Surgery Office: [phone]

## 2023-05-16 NOTE — Anesthesia Postprocedure Evaluation (Signed)
Anesthesia Post Note  Patient: Melanie Hurst  Procedure(s) Performed: LEFT INFERIOR PARATHYROIDECTOMY (Left: Neck)     Patient location during evaluation: PACU Anesthesia Type: General Level of consciousness: awake and alert Pain management: pain level controlled Vital Signs Assessment: post-procedure vital signs reviewed and stable Respiratory status: spontaneous breathing, nonlabored ventilation and respiratory function stable Cardiovascular status: blood pressure returned to baseline and stable Postop Assessment: no apparent nausea or vomiting Anesthetic complications: yes   Encounter Notable Events  Notable Event Outcome Phase Comment  Difficult to intubate - unexpected  Intraprocedure Filed from anesthesia note documentation.    Last Vitals:  Vitals:   05/16/23 0915 05/16/23 0930  BP: (!) 146/77 (!) 149/75  Pulse: 86 94  Resp: 10 11  Temp:  36.6 C  SpO2: 93% 93%    Last Pain:  Vitals:   05/16/23 0930  PainSc: 3                  Lowella Curb

## 2023-05-16 NOTE — Anesthesia Preprocedure Evaluation (Signed)
Anesthesia Evaluation  Patient identified by MRN, date of birth, ID band Patient awake    Reviewed: Allergy & Precautions, H&P , NPO status , Patient's Chart, lab work & pertinent test results  Airway Mallampati: II  TM Distance: <3 FB Neck ROM: Full    Dental no notable dental hx.    Pulmonary sleep apnea , former smoker   Pulmonary exam normal breath sounds clear to auscultation       Cardiovascular hypertension, Pt. on medications negative cardio ROS Normal cardiovascular exam Rhythm:Regular Rate:Normal     Neuro/Psych   Anxiety Depression    negative neurological ROS  negative psych ROS   GI/Hepatic Neg liver ROS,GERD  ,,  Endo/Other  negative endocrine ROS    Renal/GU negative Renal ROS  negative genitourinary   Musculoskeletal  (+) Arthritis , Osteoarthritis,  Fibromyalgia -  Abdominal   Peds negative pediatric ROS (+)  Hematology negative hematology ROS (+)   Anesthesia Other Findings   Reproductive/Obstetrics negative OB ROS                             Anesthesia Physical Anesthesia Plan  ASA: 2  Anesthesia Plan: General   Post-op Pain Management: Dilaudid IV   Induction: Intravenous  PONV Risk Score and Plan: 3 and Treatment may vary due to age or medical condition, Ondansetron, Dexamethasone and Midazolam  Airway Management Planned: Oral ETT  Additional Equipment:   Intra-op Plan:   Post-operative Plan: Extubation in OR  Informed Consent: I have reviewed the patients History and Physical, chart, labs and discussed the procedure including the risks, benefits and alternatives for the proposed anesthesia with the patient or authorized representative who has indicated his/her understanding and acceptance.     Dental advisory given  Plan Discussed with: CRNA and Surgeon  Anesthesia Plan Comments:        Anesthesia Quick Evaluation

## 2023-05-16 NOTE — Discharge Instructions (Addendum)

## 2023-05-16 NOTE — Interval H&P Note (Signed)
History and Physical Interval Note:  05/16/2023 7:03 AM  Melanie Hurst  has presented today for surgery, with the diagnosis of PRIMARY HYPERPARATYROIDECTOMY.  The various methods of treatment have been discussed with the patient and family. After consideration of risks, benefits and other options for treatment, the patient has consented to    Procedure(s) with comments: LEFT INFERIOR PARATHYROIDECTOMY (Left) - FROZEN SECTION 2ND SCRUB PERSON as a surgical intervention.    The patient's history has been reviewed, patient examined, no change in status, stable for surgery.  I have reviewed the patient's chart and labs.  Questions were answered to the patient's satisfaction.    Darnell Level, MD [facility] practice Office: [phone]   Darnell Level

## 2023-05-16 NOTE — Anesthesia Procedure Notes (Addendum)
Procedure Name: Intubation Date/Time: 05/16/2023 7:26 AM  Performed by: Elisabeth Cara, CRNAPre-anesthesia Checklist: Patient identified, Emergency Drugs available, Suction available, Patient being monitored and Timeout performed Patient Re-evaluated:Patient Re-evaluated prior to induction Oxygen Delivery Method: Circle system utilized Preoxygenation: Pre-oxygenation with 100% oxygen Induction Type: IV induction Ventilation: Mask ventilation without difficulty Laryngoscope Size: Glidescope Grade View: Grade I Tube type: Parker flex tip Tube size: 7.5 mm Number of attempts: 1 Airway Equipment and Method: Rigid stylet and Video-laryngoscopy Placement Confirmation: ETT inserted through vocal cords under direct vision, positive ETCO2 and breath sounds checked- equal and bilateral Secured at: 23 cm Tube secured with: Tape Dental Injury: Teeth and Oropharynx as per pre-operative assessment  Difficulty Due To: Difficult Airway- due to anterior larynx and Difficulty was unanticipated Comments: Smooth IV induction., Easy mask. DL X 1 with MAC 4 by CRNA. Grade 2 view. - ETCO2. ETT removed. VSS. DL X 1 by DR Hyacinth Meeker with MAC 4. Grade 2 view. Unable to pass ETT with Boogie. Masked. Sats 100%. DL X 1 with GLidescope #3. Grade 1 view. ATOI. + ETCO2. BBS=. ETT secured at 23 at the lip.

## 2023-05-16 NOTE — Progress Notes (Signed)
   05/16/23 0600  OBSTRUCTIVE SLEEP APNEA  Have you ever been diagnosed with sleep apnea through a sleep study? Yes  If yes, do you have and use a CPAP or BPAP machine every night? 1  Do you know the presssure settings on your maching? Yes  Do you snore loudly (loud enough to be heard through closed doors)?  0  Do you often feel tired, fatigued, or sleepy during the daytime (such as falling asleep during driving or talking to someone)? 1  Has anyone observed you stop breathing during your sleep? 1  Do you have, or are you being treated for high blood pressure? 1  BMI more than 35 kg/m2? 1  Age > 50 (1-yes) 1  Female Gender (Yes=1) 0  Obstructive Sleep Apnea Score 5   Patient has a current sleep physician Dr. Tora Duck, does not need referral, satisfied with her care.

## 2023-05-16 NOTE — Transfer of Care (Signed)
Immediate Anesthesia Transfer of Care Note  Patient: Melanie Hurst  Procedure(s) Performed: LEFT INFERIOR PARATHYROIDECTOMY (Left: Neck)  Patient Location: PACU  Anesthesia Type:General  Level of Consciousness: awake, alert , oriented, and patient cooperative  Airway & Oxygen Therapy: Patient Spontanous Breathing and Patient connected to face mask oxygen  Post-op Assessment: Report given to RN, Post -op Vital signs reviewed and stable, and Patient moving all extremities  Post vital signs: Reviewed and stable  Last Vitals:  Vitals Value Taken Time  BP 154/86 05/16/23 0830  Temp    Pulse 103 05/16/23 0831  Resp 22 05/16/23 0831  SpO2 100 % 05/16/23 0831  Vitals shown include unfiled device data.  Last Pain:  Vitals:   05/16/23 0556  PainSc: 0-No pain         Complications:  Encounter Notable Events  Notable Event Outcome Phase Comment  Difficult to intubate - unexpected  Intraprocedure Filed from anesthesia note documentation.

## 2023-05-17 ENCOUNTER — Encounter (HOSPITAL_COMMUNITY): Payer: Self-pay | Admitting: Surgery

## 2023-05-24 ENCOUNTER — Encounter: Payer: Self-pay | Admitting: Family Medicine

## 2023-05-24 ENCOUNTER — Ambulatory Visit (INDEPENDENT_AMBULATORY_CARE_PROVIDER_SITE_OTHER): Payer: Medicare HMO | Admitting: Family Medicine

## 2023-05-24 VITALS — BP 120/70 | HR 77 | Temp 98.4°F | Ht 70.0 in | Wt 238.0 lb

## 2023-05-24 DIAGNOSIS — A09 Infectious gastroenteritis and colitis, unspecified: Secondary | ICD-10-CM

## 2023-05-24 MED ORDER — HYOSCYAMINE SULFATE 0.125 MG SL SUBL: 0.1250 mg | SUBLINGUAL_TABLET | SUBLINGUAL | 0 refills | Status: DC | PRN

## 2023-05-24 NOTE — Addendum Note (Signed)
Addended by: Venia Carbon K on: 05/24/2023 04:33 PM   Modules accepted: Orders

## 2023-05-24 NOTE — Progress Notes (Signed)
Subjective:    Patient ID: Melanie Hurst, female    DOB: 1950/11/21, 72 y.o.   MRN: [id number]  Patient presents today with several days of diarrhea.  She had surgery last Monday to treat hyperparathyroidism.  2 days after surgery, the patient developed frequent diarrhea, crampy diffuse abdominal pain, nausea, and a feeling of fatigue and "mental fog".  She denies fevers.  She denies any blood in her stools or melena.  She denies any vomiting.  Today on exam, her abdomen is soft, nondistended, with normal bowel sounds.  There is no tenderness or guarding.  However the patient continues to report diarrhea.  She has been taking activated charcoal and she states that that is "helping to bind the toxin and solidify her stool."  She believes that the infection occurred after she ate "a bad salad". Past Medical History:  Diagnosis Date   Anemia    Anxiety    Arthritis    Chronic fatigue syndrome with fibromyalgia    Depression    Family history of adverse reaction to anesthesia    Mother died on OR table   Fibromyalgia    GERD (gastroesophageal reflux disease)    Glaucoma    Hypertension    IBS (irritable bowel syndrome)    Obesity    POTS (postural orthostatic tachycardia syndrome)    takes Diltiazem   Primary hyperparathyroidism (HCC)    Scoliosis    Sleep apnea    cpap at 7 cm   Tremor, essential    Past Surgical History:  Procedure Laterality Date   BIOPSY  02/18/2023   Procedure: BIOPSY;  Surgeon: Beverley Fiedler, MD;  Location: [facility];  Service: Gastroenterology;;   COLONOSCOPY  10/2021   DILATION AND CURETTAGE OF UTERUS     ENTEROSCOPY N/A 02/18/2023   Procedure: ENTEROSCOPY;  Surgeon: Beverley Fiedler, MD;  Location: [facility];  Service: Gastroenterology;  Laterality: N/A;   ESOPHAGOGASTRODUODENOSCOPY  10/2021   PARATHYROIDECTOMY Left 05/16/2023   Procedure: LEFT INFERIOR PARATHYROIDECTOMY;  Surgeon: Darnell Level, MD;  Location: [facility] ORS;  Service: General;  Laterality: Left;   FROZEN SECTION 2ND SCRUB PERSON   PCOS surgery  1971   wedge resection of both ovaries   TONSILECTOMY, ADENOIDECTOMY, BILATERAL MYRINGOTOMY AND TUBES  1962   Current Outpatient Medications on File Prior to Visit  Medication Sig Dispense Refill   acetaminophen (TYLENOL) 500 MG tablet Take 1,000 mg by mouth 2 (two) times daily.     ARIPiprazole (ABILIFY) 5 MG tablet Take 1 tablet (5 mg total) by mouth daily. 90 tablet 3   diltiazem (CARDIZEM) 30 MG tablet Take 1 tablet (30 mg total) by mouth 2 (two) times daily. 180 tablet 3   ferrous sulfate 325 (65 FE) MG tablet Take 325 mg by mouth daily with breakfast.     fexofenadine (ALLEGRA) 180 MG tablet Take 180 mg by mouth daily.     folic acid (FOLVITE) 1 MG tablet Take 1 tablet (1 mg total) by mouth daily. 30 tablet 0   guaiFENesin (MUCINEX) 600 MG 12 hr tablet Take 600 mg by mouth daily as needed for cough or to loosen phlegm.     losartan (COZAAR) 50 MG tablet Take 50 mg by mouth daily.     modafinil (PROVIGIL) 200 MG tablet Take 1 tablet by mouth in the morning and at bedtime. 180 tablet 0   Multiple Vitamin (MULTIVITAMIN ADULT PO) Take 1 tablet by mouth daily.     pantoprazole (PROTONIX) 40 MG  tablet Take 1 tablet (40 mg total) by mouth 2 (two) times daily before a meal. 180 tablet 1   sertraline (ZOLOFT) 100 MG tablet Take 1 tablet (100 mg total) by mouth daily. 90 tablet 3   traMADol (ULTRAM) 50 MG tablet Take 1 tablet (50 mg total) by mouth every 6 (six) hours as needed for moderate pain. 12 tablet 0   Vitamin D-Vitamin K (VITAMIN K2-VITAMIN D3 PO) Take 1 tablet by mouth daily.     No current facility-administered medications on file prior to visit.   Allergies  Allergen Reactions   Asa [Aspirin] Hives   Codeine Nausea And Vomiting   Statins Nausea Only    Malaise    Social History   Socioeconomic History   Marital status: Married    Spouse name: Not on file   Number of children: 0   Years of education: Not on file   Highest  education level: Bachelor's degree (e.g., BA, AB, BS)  Occupational History   Occupation: retired  Tobacco Use   Smoking status: Former    Current packs/day: 0.00    Types: Cigarettes    Start date: 10/12/2003    Quit date: 10/11/2009    Years since quitting: 13.6   Smokeless tobacco: Never  Vaping Use   Vaping status: Former   Devices: vaped to stop smoking  Substance and Sexual Activity   Alcohol use: Yes    Comment: Occasional wine   Drug use: No   Sexual activity: Not Currently  Other Topics Concern   Not on file  Social History Narrative   Not on file   Social Determinants of Health   Financial Resource Strain: Medium Risk (03/03/2023)   Overall Financial Resource Strain (CARDIA)    Difficulty of Paying Living Expenses: Somewhat hard  Food Insecurity: Food Insecurity Present (03/03/2023)   Hunger Vital Sign    Worried About Running Out of Food in the Last Year: Sometimes true    Ran Out of Food in the Last Year: Sometimes true  Transportation Needs: No Transportation Needs (03/03/2023)   PRAPARE - Administrator, Civil Service (Medical): No    Lack of Transportation (Non-Medical): No  Physical Activity: Unknown (03/03/2023)   Exercise Vital Sign    Days of Exercise per Week: 0 days    Minutes of Exercise per Session: Not on file  Stress: No Stress Concern Present (03/03/2023)   [employer] of Occupational Health - Occupational Stress Questionnaire    Feeling of Stress : Only a little  Social Connections: Moderately Integrated (03/03/2023)   Social Connection and Isolation Panel [NHANES]    Frequency of Communication with Friends and Family: Once a week    Frequency of Social Gatherings with Friends and Family: Once a week    Attends Religious Services: More than 4 times per year    Active Member of [employer] or Organizations: Yes    Attends Engineer, structural: More than 4 times per year    Marital Status: Married  Catering manager Violence: Not  on file      Review of Systems  All other systems reviewed and are negative.      Objective:   Physical Exam Vitals reviewed.  Constitutional:      Appearance: She is well-developed. She is obese. She is not diaphoretic.  Cardiovascular:     Rate and Rhythm: Normal rate and regular rhythm.     Heart sounds: Normal heart sounds. No murmur heard. Pulmonary:  Effort: Pulmonary effort is normal. No respiratory distress.     Breath sounds: Normal breath sounds. No wheezing.  Abdominal:     General: Bowel sounds are normal. There is no distension.     Palpations: Abdomen is soft.     Tenderness: There is no abdominal tenderness. There is no guarding or rebound.  Musculoskeletal:     Right lower leg: No edema.     Left lower leg: No edema.  Neurological:     Mental Status: She is alert.     Motor: No abnormal muscle tone.     Deep Tendon Reflexes: Reflexes are normal and symmetric.           Assessment & Plan:  Diarrhea of infectious origin - Plan: GI pathogen panel by PCR, stool Exam today is reassuring.  I will check stool culture, stool O&P, and C. difficile given her recent hospital exposure.  I suspect however most likely a benign gastroenteritis.  Recommended that she push fluids such as Gatorade and treat intestinal spasms with Levsin.  Anticipate spontaneous improvement over the next 2 to 3 days.  If stool cultures returned positive or if symptoms worsen we will treat the identified source most likely with Cipro if necessary

## 2023-05-25 DIAGNOSIS — A09 Infectious gastroenteritis and colitis, unspecified: Secondary | ICD-10-CM | POA: Diagnosis not present

## 2023-05-28 LAB — SALMONELLA/SHIGELLA CULT, CAMPY EIA AND SHIGA TOXIN RFL ECOLI

## 2023-05-28 LAB — OVA AND PARASITE EXAMINATION: TRICHROME RESULT:: NONE SEEN

## 2023-05-31 ENCOUNTER — Encounter: Payer: Self-pay | Admitting: Family Medicine

## 2023-06-01 ENCOUNTER — Other Ambulatory Visit: Payer: Medicare HMO

## 2023-06-01 DIAGNOSIS — A09 Infectious gastroenteritis and colitis, unspecified: Secondary | ICD-10-CM | POA: Diagnosis not present

## 2023-06-02 ENCOUNTER — Telehealth: Payer: Medicare HMO | Admitting: Family Medicine

## 2023-06-03 LAB — GASTROINTESTINAL PATHOGEN PNL
CampyloBacter Group: NOT DETECTED
Norovirus GI/GII: NOT DETECTED
Rotavirus A: NOT DETECTED
Salmonella species: NOT DETECTED
Shiga Toxin 1: NOT DETECTED
Shiga Toxin 2: NOT DETECTED
Shigella Species: NOT DETECTED
Vibrio Group: NOT DETECTED
Yersinia enterocolitica: NOT DETECTED

## 2023-06-07 ENCOUNTER — Other Ambulatory Visit: Payer: Self-pay | Admitting: Family Medicine

## 2023-06-07 MED ORDER — METRONIDAZOLE 500 MG PO TABS: 500.0000 mg | ORAL_TABLET | Freq: Two times a day (BID) | ORAL | 0 refills | Status: AC

## 2023-06-08 DIAGNOSIS — E785 Hyperlipidemia, unspecified: Secondary | ICD-10-CM | POA: Diagnosis not present

## 2023-06-08 DIAGNOSIS — R32 Unspecified urinary incontinence: Secondary | ICD-10-CM | POA: Diagnosis not present

## 2023-06-08 DIAGNOSIS — Z008 Encounter for other general examination: Secondary | ICD-10-CM | POA: Diagnosis not present

## 2023-06-08 DIAGNOSIS — G47419 Narcolepsy without cataplexy: Secondary | ICD-10-CM | POA: Diagnosis not present

## 2023-06-08 DIAGNOSIS — E669 Obesity, unspecified: Secondary | ICD-10-CM | POA: Diagnosis not present

## 2023-06-08 DIAGNOSIS — R197 Diarrhea, unspecified: Secondary | ICD-10-CM | POA: Diagnosis not present

## 2023-06-08 DIAGNOSIS — M1990 Unspecified osteoarthritis, unspecified site: Secondary | ICD-10-CM | POA: Diagnosis not present

## 2023-06-08 DIAGNOSIS — F419 Anxiety disorder, unspecified: Secondary | ICD-10-CM | POA: Diagnosis not present

## 2023-06-08 DIAGNOSIS — M545 Low back pain, unspecified: Secondary | ICD-10-CM | POA: Diagnosis not present

## 2023-06-08 DIAGNOSIS — H409 Unspecified glaucoma: Secondary | ICD-10-CM | POA: Diagnosis not present

## 2023-06-08 DIAGNOSIS — I10 Essential (primary) hypertension: Secondary | ICD-10-CM | POA: Diagnosis not present

## 2023-06-08 DIAGNOSIS — H3530 Unspecified macular degeneration: Secondary | ICD-10-CM | POA: Diagnosis not present

## 2023-06-08 DIAGNOSIS — K219 Gastro-esophageal reflux disease without esophagitis: Secondary | ICD-10-CM | POA: Diagnosis not present

## 2023-06-10 DIAGNOSIS — Z9089 Acquired absence of other organs: Secondary | ICD-10-CM | POA: Diagnosis not present

## 2023-06-10 DIAGNOSIS — Z9889 Other specified postprocedural states: Secondary | ICD-10-CM | POA: Diagnosis not present

## 2023-06-10 DIAGNOSIS — E210 Primary hyperparathyroidism: Secondary | ICD-10-CM | POA: Diagnosis not present

## 2023-06-14 DIAGNOSIS — Z9089 Acquired absence of other organs: Secondary | ICD-10-CM | POA: Insufficient documentation

## 2023-06-16 ENCOUNTER — Encounter: Payer: Self-pay | Admitting: Family Medicine

## 2023-06-22 ENCOUNTER — Telehealth: Payer: Self-pay | Admitting: Family Medicine

## 2023-06-22 NOTE — Telephone Encounter (Signed)
Patient called to advise provider she's been exposed to her husband who tested positive for COVID this morning at about 10am.  Sx: sore throat, slight cough and congestion  Patient advised she will take an at-home test and call back with the results.

## 2023-06-23 ENCOUNTER — Other Ambulatory Visit: Payer: Self-pay | Admitting: Family Medicine

## 2023-06-23 ENCOUNTER — Ambulatory Visit (INDEPENDENT_AMBULATORY_CARE_PROVIDER_SITE_OTHER): Payer: Medicare HMO

## 2023-06-23 VITALS — Ht 70.0 in | Wt 238.0 lb

## 2023-06-23 DIAGNOSIS — Z0000 Encounter for general adult medical examination without abnormal findings: Secondary | ICD-10-CM | POA: Diagnosis not present

## 2023-06-23 DIAGNOSIS — Z1231 Encounter for screening mammogram for malignant neoplasm of breast: Secondary | ICD-10-CM

## 2023-06-23 MED ORDER — NIRMATRELVIR/RITONAVIR (PAXLOVID)TABLET: 3.0000 | ORAL_TABLET | Freq: Two times a day (BID) | ORAL | 0 refills | Status: AC

## 2023-06-23 NOTE — Progress Notes (Signed)
Subjective:   Melanie Hurst is a 72 y.o. female who presents for Medicare Annual (Subsequent) preventive examination.  Visit Complete: Virtual  I connected with  Melanie Hurst on 06/23/23 by a audio enabled telemedicine application and verified that I am speaking with the correct person using two identifiers.  Patient Location: Home  Provider Location: Home Office  I discussed the limitations of evaluation and management by telemedicine. The patient expressed understanding and agreed to proceed.  Vital Signs: Because this visit was a virtual/telehealth visit, some criteria may be missing or patient reported. Any vitals not documented were not able to be obtained and vitals that have been documented are patient reported.   Review of Systems     Cardiac Risk Factors include: advanced age (>49men, >14 women);dyslipidemia;hypertension     Objective:    Today's Vitals   06/23/23 1058  Weight: 238 lb (108 kg)  Height: 5\' 10"  (1.778 m)   Body mass index is 34.15 kg/m.     06/23/2023   11:05 AM 05/16/2023    6:01 AM 04/21/2023    1:03 PM 02/17/2023    5:26 PM 02/17/2023    8:19 AM 07/21/2022    2:02 PM  Advanced Directives  Does Patient Have a Medical Advance Directive? Yes Yes Yes No Yes Yes  Type of Estate agent of [place];Living will Living will;Healthcare Power of [employer] Power of [place];Living will  Living will;Healthcare Power of [employer] Power of [address];Living will  Does patient want to make changes to medical advance directive? No - Patient declined No - Patient declined  No - Patient declined No - Patient declined No - Patient declined  Copy of Healthcare Power of Attorney in Chart? Yes - validated most recent copy scanned in chart (See row information) Yes - validated most recent copy scanned in chart (See row information) No - copy requested   No - copy requested  Would patient like information on creating a medical advance  directive?  No - Patient declined  No - Patient declined      Current Medications (verified) Outpatient Encounter Medications as of 06/23/2023  Medication Sig   acetaminophen (TYLENOL) 500 MG tablet Take 1,000 mg by mouth 2 (two) times daily.   ARIPiprazole (ABILIFY) 5 MG tablet Take 1 tablet (5 mg total) by mouth daily.   diltiazem (CARDIZEM) 30 MG tablet Take 1 tablet (30 mg total) by mouth 2 (two) times daily.   ferrous sulfate 325 (65 FE) MG tablet Take 325 mg by mouth daily with breakfast.   fexofenadine (ALLEGRA) 180 MG tablet Take 180 mg by mouth daily.   folic acid (FOLVITE) 1 MG tablet Take 1 tablet (1 mg total) by mouth daily.   guaiFENesin (MUCINEX) 600 MG 12 hr tablet Take 600 mg by mouth daily as needed for cough or to loosen phlegm.   hyoscyamine (LEVSIN/SL) 0.125 MG SL tablet Place 1 tablet (0.125 mg total) under the tongue every 4 (four) hours as needed.   losartan (COZAAR) 50 MG tablet Take 50 mg by mouth daily.   modafinil (PROVIGIL) 200 MG tablet Take 1 tablet by mouth in the morning and at bedtime.   Multiple Vitamin (MULTIVITAMIN ADULT PO) Take 1 tablet by mouth daily.   pantoprazole (PROTONIX) 40 MG tablet Take 1 tablet (40 mg total) by mouth 2 (two) times daily before a meal.   sertraline (ZOLOFT) 100 MG tablet Take 1 tablet (100 mg total) by mouth daily.   traMADol (ULTRAM) 50  MG tablet Take 1 tablet (50 mg total) by mouth every 6 (six) hours as needed for moderate pain.   Vitamin D-Vitamin K (VITAMIN K2-VITAMIN D3 PO) Take 1 tablet by mouth daily.   No facility-administered encounter medications on file as of 06/23/2023.    Allergies (verified) Asa [aspirin], Codeine, and Statins   History: Past Medical History:  Diagnosis Date   Anemia    Anxiety    Arthritis    Chronic fatigue syndrome with fibromyalgia    Depression    Family history of adverse reaction to anesthesia    Mother died on OR table   Fibromyalgia    GERD (gastroesophageal reflux disease)     Glaucoma    Hypertension    IBS (irritable bowel syndrome)    Obesity    POTS (postural orthostatic tachycardia syndrome)    takes Diltiazem   Primary hyperparathyroidism (HCC)    Scoliosis    Sleep apnea    cpap at 7 cm   Tremor, essential    Past Surgical History:  Procedure Laterality Date   BIOPSY  02/18/2023   Procedure: BIOPSY;  Surgeon: Beverley Fiedler, MD;  Location: [facility] ENDOSCOPY;  Service: Gastroenterology;;   COLONOSCOPY  10/2021   DILATION AND CURETTAGE OF UTERUS     ENTEROSCOPY N/A 02/18/2023   Procedure: ENTEROSCOPY;  Surgeon: Beverley Fiedler, MD;  Location: [facility] ENDOSCOPY;  Service: Gastroenterology;  Laterality: N/A;   ESOPHAGOGASTRODUODENOSCOPY  10/2021   PARATHYROIDECTOMY Left 05/16/2023   Procedure: LEFT INFERIOR PARATHYROIDECTOMY;  Surgeon: Darnell Level, MD;  Location: [facility] ORS;  Service: General;  Laterality: Left;  FROZEN SECTION 2ND SCRUB PERSON   PCOS surgery  1971   wedge resection of both ovaries   TONSILECTOMY, ADENOIDECTOMY, BILATERAL MYRINGOTOMY AND TUBES  1962   Family History  Problem Relation Age of Onset   Hypertension Mother    Melanoma Mother        wrist   Other Mother        brain tumor   Stroke Mother    Hyperlipidemia Mother    Cancer Father    Hyperlipidemia Father    Hypertension Father    Colon polyps Father        not cancerous   Kidney failure Father    Breast cancer Sister    Stomach cancer Maternal Uncle    Breast cancer Paternal Aunt    Colon cancer Paternal Aunt    Irritable bowel syndrome Paternal Aunt    Esophageal cancer Neg Hx    Rectal cancer Neg Hx    Social History   Socioeconomic History   Marital status: Married    Spouse name: Not on file   Number of children: 0   Years of education: Not on file   Highest education level: Bachelor's degree (e.g., BA, AB, BS)  Occupational History   Occupation: retired  Tobacco Use   Smoking status: Former    Current packs/day: 0.00    Types: Cigarettes    Start date:  10/12/2003    Quit date: 10/11/2009    Years since quitting: 13.7   Smokeless tobacco: Never  Vaping Use   Vaping status: Former   Devices: vaped to stop smoking  Substance and Sexual Activity   Alcohol use: Yes    Comment: Occasional wine   Drug use: No   Sexual activity: Not Currently  Other Topics Concern   Not on file  Social History Narrative   Not on file   Social Determinants of  Health   Financial Resource Strain: Low Risk  (06/23/2023)   Overall Financial Resource Strain (CARDIA)    Difficulty of Paying Living Expenses: Not very hard  Food Insecurity: Food Insecurity Present (06/23/2023)   Hunger Vital Sign    Worried About Running Out of Food in the Last Year: Sometimes true    Ran Out of Food in the Last Year: Never true  Transportation Needs: No Transportation Needs (06/23/2023)   PRAPARE - Administrator, Civil Service (Medical): No    Lack of Transportation (Non-Medical): No  Physical Activity: Inactive (06/23/2023)   Exercise Vital Sign    Days of Exercise per Week: 0 days    Minutes of Exercise per Session: 0 min  Stress: No Stress Concern Present (06/23/2023)   [employer] of Occupational Health - Occupational Stress Questionnaire    Feeling of Stress : Only a little  Social Connections: Moderately Integrated (06/23/2023)   Social Connection and Isolation Panel [NHANES]    Frequency of Communication with Friends and Family: Once a week    Frequency of Social Gatherings with Friends and Family: Once a week    Attends Religious Services: More than 4 times per year    Active Member of [employer] or Organizations: Yes    Attends Engineer, structural: More than 4 times per year    Marital Status: Married    Tobacco Counseling Counseling given: Not Answered   Clinical Intake:  Pre-visit preparation completed: Yes  Pain : No/denies pain     Diabetes: No  How often do you need to have someone help you when you read instructions,  pamphlets, or other written materials from your doctor or pharmacy?: 1 - Never  Interpreter Needed?: No  Information entered by :: Kandis Fantasia LPN   Activities of Daily Living    06/23/2023   11:04 AM 04/21/2023    1:01 PM  In your present state of health, do you have any difficulty performing the following activities:  Hearing? 0 0  Vision? 0 1  Comment  Blurry vision due to glaucoma  Difficulty concentrating or making decisions? 0 0  Walking or climbing stairs? 0 1  Comment  Due to back problems, scoliosis  Dressing or bathing? 0 0  Doing errands, shopping? 0 0  Preparing Food and eating ? N   Using the Toilet? N   In the past six months, have you accidently leaked urine? N   Do you have problems with loss of bowel control? N   Managing your Medications? N   Managing your Finances? N   Housekeeping or managing your Housekeeping? N     Patient Care Team: Donita Brooks, MD as PCP - General (Family Medicine) Meriam Sprague, MD as PCP - Cardiology (Cardiology) Erroll Luna, [facility] (Inactive) as Pharmacist (Pharmacist) Ablott, Minerva Areola, OD (Optometry)  Indicate any recent Medical Services you may have received from other than [facility] providers in the past year (date may be approximate).     Assessment:   This is a routine wellness examination for Melanie Hurst.  Hearing/Vision screen Hearing Screening - Comments:: Denies hearing difficulties   Vision Screening - Comments:: Wears rx glasses - up to date with routine eye exams with Dr. Bunnie Pion    Goals Addressed             This Visit's Progress    Remain active and independent        Depression Screen    06/23/2023  11:03 AM 06/17/2022   10:34 AM 11/04/2020    2:18 PM 08/07/2020   12:08 PM 08/04/2020   12:37 PM 05/30/2020   12:30 PM 12/12/2018   11:14 AM  PHQ 2/9 Scores  PHQ - 2 Score 2 2 0 4 0 6 6  PHQ- 9 Score 6 7  10  21 16     Fall Risk    06/23/2023   11:04 AM 03/04/2023    2:06 PM 06/17/2022    10:38 AM 11/04/2020    2:18 PM 08/04/2020   12:37 PM  Fall Risk   Falls in the past year? 0 1 0  0  Number falls in past yr: 0 0 0 0 0  Injury with Fall? 0 1 0 0 0  Risk for fall due to : No Fall Risks History of fall(s);Impaired balance/gait Impaired balance/gait;Impaired mobility    Follow up Falls prevention discussed;Education provided;Falls evaluation completed Education provided;Falls prevention discussed Falls prevention discussed Falls evaluation completed Falls evaluation completed    MEDICARE RISK AT HOME: Medicare Risk at Home Any stairs in or around the home?: No If so, are there any without handrails?: No Home free of loose throw rugs in walkways, pet beds, electrical cords, etc?: Yes Adequate lighting in your home to reduce risk of falls?: Yes Life alert?: No Use of a cane, walker or w/c?: No Grab bars in the bathroom?: Yes Shower chair or bench in shower?: No Elevated toilet seat or a handicapped toilet?: No  TIMED UP AND GO:  Was the test performed?  No    Cognitive Function:        06/23/2023   11:05 AM  6CIT Screen  What Year? 0 points  What month? 0 points  What time? 0 points  Count back from 20 0 points  Months in reverse 0 points  Repeat phrase 0 points  Total Score 0 points    Immunizations Immunization History  Administered Date(s) Administered   Fluad Quad(high Dose 65+) 06/05/2019, 07/10/2020   Influenza Whole 06/11/2010   Influenza, High Dose Seasonal PF 08/08/2017, 07/27/2018   Influenza,inj,Quad PF,6+ Mos 06/11/2016   Influenza-Unspecified 06/26/2022   PFIZER(Purple Top)SARS-COV-2 Vaccination 11/17/2019, 12/12/2019   PNEUMOCOCCAL CONJUGATE-20 04/24/2022   Tdap 09/01/2020   Zoster Recombinant(Shingrix) 02/07/2018, 04/10/2018    TDAP status: Up to date  Flu Vaccine status: Due, Education has been provided regarding the importance of this vaccine. Advised may receive this vaccine at local pharmacy or Health Dept. Aware to provide a  copy of the vaccination record if obtained from local pharmacy or Health Dept. Verbalized acceptance and understanding.  Pneumococcal vaccine status: Up to date  Covid-19 vaccine status: Information provided on how to obtain vaccines.   Qualifies for Shingles Vaccine? Yes   Zostavax completed No   Shingrix Completed?: Yes  Screening Tests Health Maintenance  Topic Date Due   MAMMOGRAM  09/09/2022   INFLUENZA VACCINE  05/12/2023   COVID-19 Vaccine (3 - 2023-24 season) 06/12/2023   Medicare Annual Wellness (AWV)  06/22/2024   Colonoscopy  11/10/2024   DTaP/Tdap/Td (2 - Td or Tdap) 09/01/2030   Pneumonia Vaccine 43+ Years old  Completed   DEXA SCAN  Completed   Hepatitis C Screening  Completed   Zoster Vaccines- Shingrix  Completed   HPV VACCINES  Aged Out    Health Maintenance  Health Maintenance Due  Topic Date Due   MAMMOGRAM  09/09/2022   INFLUENZA VACCINE  05/12/2023   COVID-19 Vaccine (3 - 2023-24 season)  06/12/2023    Colorectal cancer screening: Type of screening: Colonoscopy. Completed 11/10/21. Repeat every 3 years  Mammogram status: Ordered today. Pt provided with contact info and advised to call to schedule appt.   Bone Density status: Completed 05/25/22. Results reflect: Bone density results: NORMAL. Repeat every 5 years.  Lung Cancer Screening: (Low Dose CT Chest recommended if Age 77-80 years, 20 pack-year currently smoking OR have quit w/in 15years.) does not qualify.   Lung Cancer Screening Referral: n/a  Additional Screening:  Hepatitis C Screening: does qualify; Completed 03/12/22  Vision Screening: Recommended annual ophthalmology exams for early detection of glaucoma and other disorders of the eye. Is the patient up to date with their annual eye exam?  Yes  Who is the provider or what is the name of the office in which the patient attends annual eye exams? Dr. Bunnie Pion  If pt is not established with a provider, would they like to be referred to a  provider to establish care? No .   Dental Screening: Recommended annual dental exams for proper oral hygiene  Community Resource Referral / Chronic Care Management: CRR required this visit?  No   CCM required this visit?  No     Plan:     I have personally reviewed and noted the following in the patient's chart:   Medical and social history Use of alcohol, tobacco or illicit drugs  Current medications and supplements including opioid prescriptions. Patient is not currently taking opioid prescriptions. Functional ability and status Nutritional status Physical activity Advanced directives List of other physicians Hospitalizations, surgeries, and ER visits in previous 12 months Vitals Screenings to include cognitive, depression, and falls Referrals and appointments  In addition, I have reviewed and discussed with patient certain preventive protocols, quality metrics, and best practice recommendations. A written personalized care plan for preventive services as well as general preventive health recommendations were provided to patient.     Kandis Fantasia [place], [place]   0/07/2724   After Visit Summary: (MyChart) Due to this being a telephonic visit, the after visit summary with patients personalized plan was offered to patient via MyChart   Nurse Notes: No concerns at this time

## 2023-06-23 NOTE — Patient Instructions (Signed)
Ms. Claes , Thank you for taking time to come for your Medicare Wellness Visit. I appreciate your ongoing commitment to your health goals. Please review the following plan we discussed and let me know if I can assist you in the future.   Referrals/Orders/Follow-Ups/Clinician Recommendations: Aim for 30 minutes of exercise or brisk walking, 6-8 glasses of water, and 5 servings of fruits and vegetables each day.  You have an order for:  []   2D Mammogram  [x]   3D Mammogram  []   Bone Density     Please call for appointment:   [facility] [address] [place], [place] [address] [phone]  Make sure to wear two-piece clothing.  No lotions, powders, or deodorants the day of the appointment. Make sure to bring picture ID and insurance card.  Bring list of medications you are currently taking including any supplements.   Schedule your [place] screening mammogram through MyChart!   Log into your MyChart account.  Go to 'Visit' (or 'Appointments' if on mobile App) --> Schedule an Appointment  Under 'Select a Reason for Visit' choose the Mammogram Screening option.  Complete the pre-visit questions and select the time and place that best fits your schedule.   This is a list of the screening recommended for you and due dates:  Health Maintenance  Topic Date Due   Mammogram  09/09/2022   Flu Shot  05/12/2023   COVID-19 Vaccine (3 - 2023-24 season) 06/12/2023   Medicare Annual Wellness Visit  06/22/2024   Colon Cancer Screening  11/10/2024   DTaP/Tdap/Td vaccine (2 - Td or Tdap) 09/01/2030   Pneumonia Vaccine  Completed   DEXA scan (bone density measurement)  Completed   Hepatitis C Screening  Completed   Zoster (Shingles) Vaccine  Completed   HPV Vaccine  Aged Out    Advanced directives: (In Chart) A copy of your advanced directives are scanned into your chart should your provider ever need it.  Next Medicare Annual Wellness Visit scheduled for  next year: Yes

## 2023-06-23 NOTE — Telephone Encounter (Signed)
Called and spoke w/pt re msg. Per pt will do the at-home covid test and will call back w/results.

## 2023-06-23 NOTE — Telephone Encounter (Signed)
Pt called and stated that her at-home covid test was positive.   Pls advise and see pt msg for symptoms

## 2023-06-23 NOTE — Telephone Encounter (Signed)
Called and spoke w/pt. Pt is aware that Paxlovid was sent to her pharmacy per pcp.   Nothing else

## 2023-07-08 ENCOUNTER — Encounter: Payer: Self-pay | Admitting: Family Medicine

## 2023-08-03 IMAGING — CT CT CHEST W/O CM
2 of 4 series · 15 of 36 positions shown, 18 images · non-contrast
Comparison: Chest radiograph, 11/18/2020.

CLINICAL DATA: Provided indication is large hiatal hernia.



[Series 3: routine chest without · axial · non-contrast · 0.75mm/px · z∈[+562,+848]mm · 12 of 169 slices shown, 15 images]
[im 13/169  mediastinal]
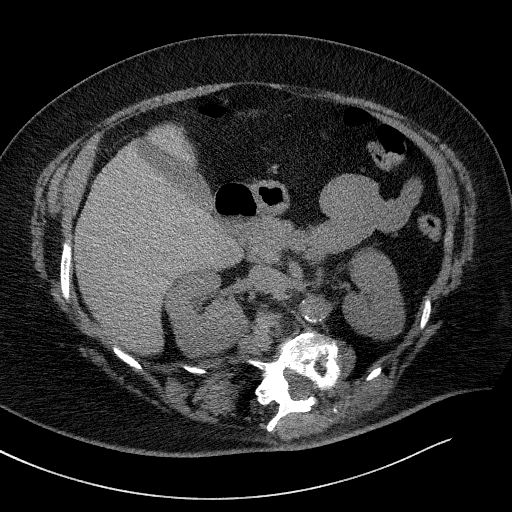
[im 13/169  lung]
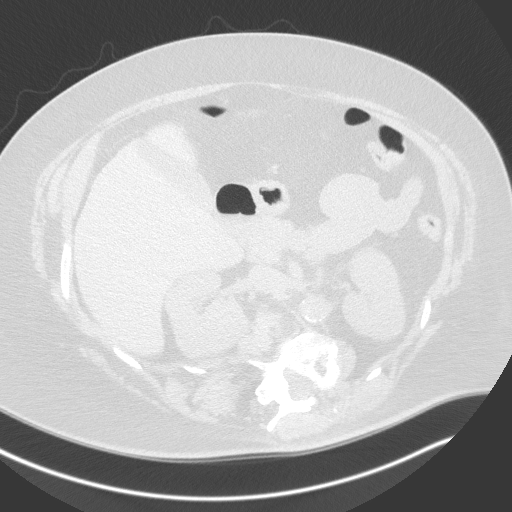
[im 26/169  lung]
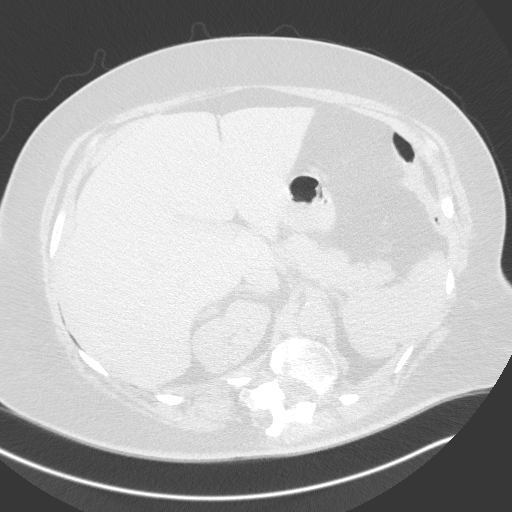
[im 39/169  lung]
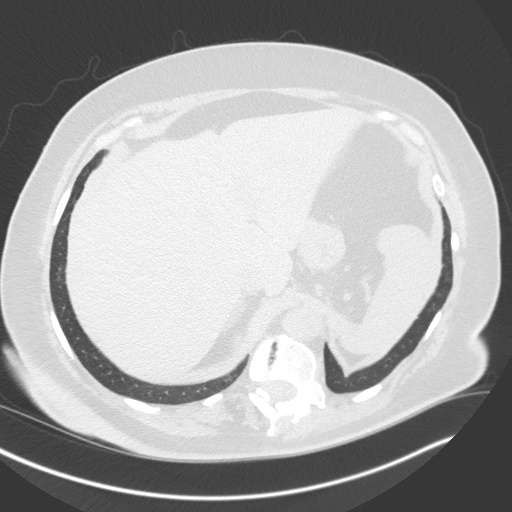
[im 52/169  lung]
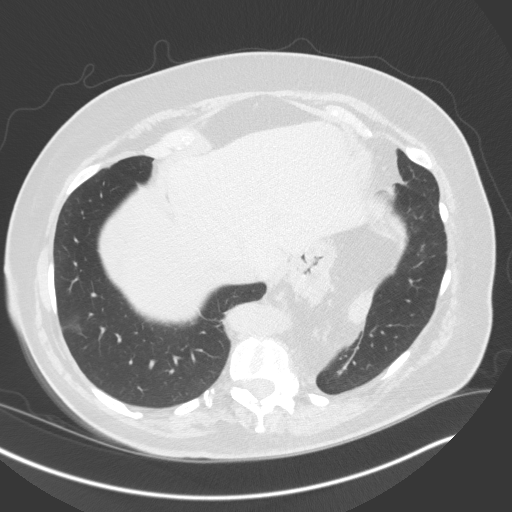
[im 65/169  mediastinal]
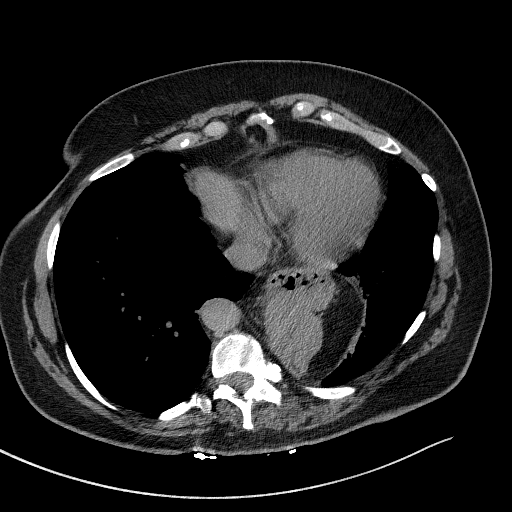
[im 65/169  lung]
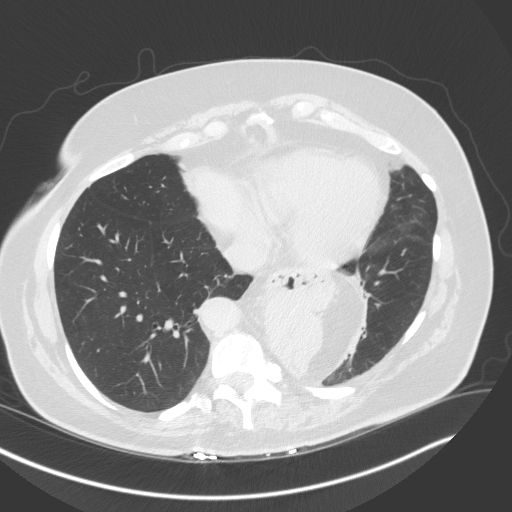
[im 78/169  lung]
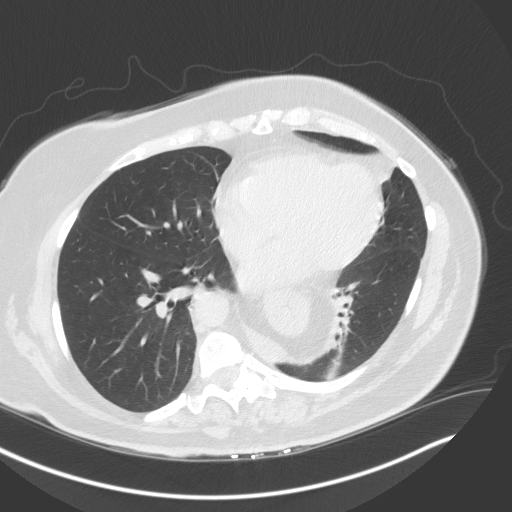
[im 91/169  lung]
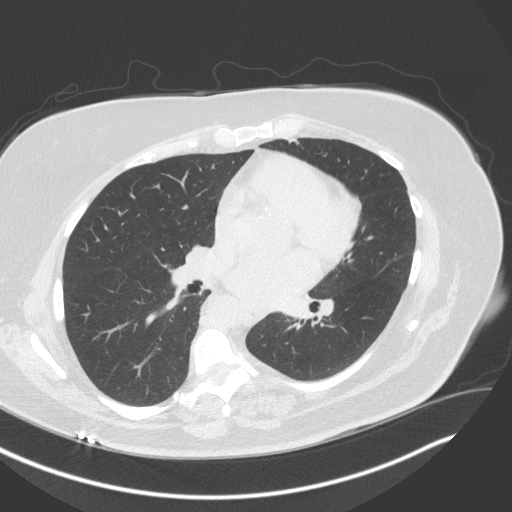
[im 104/169  lung]
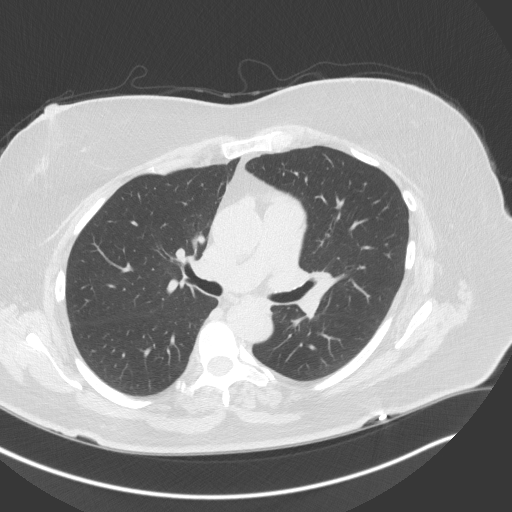
[im 117/169  mediastinal]
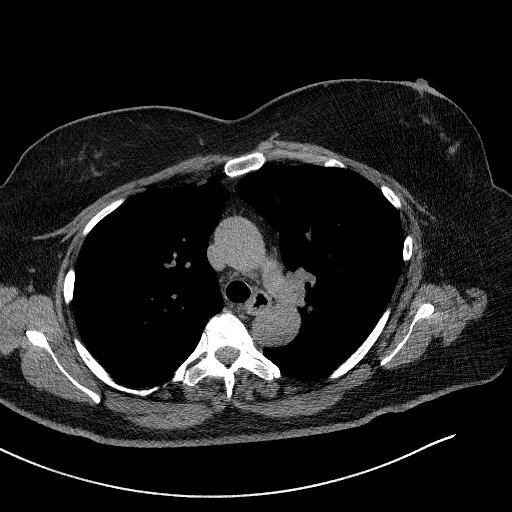
[im 117/169  lung]
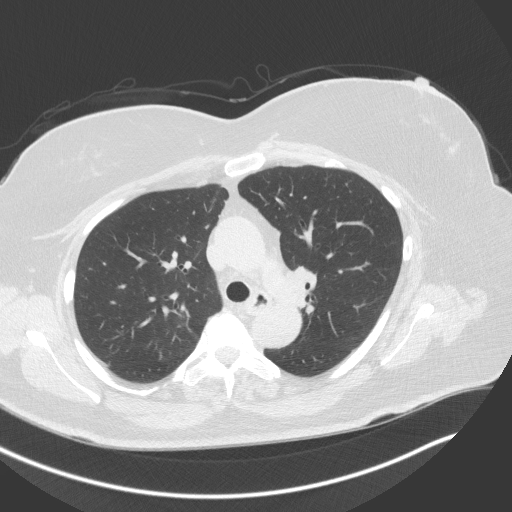
[im 130/169  lung]
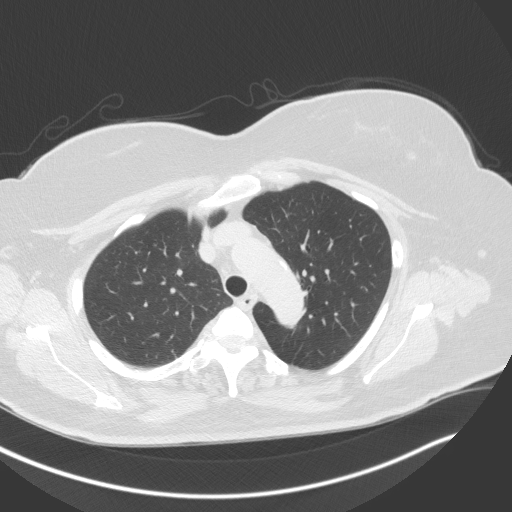
[im 143/169  lung]
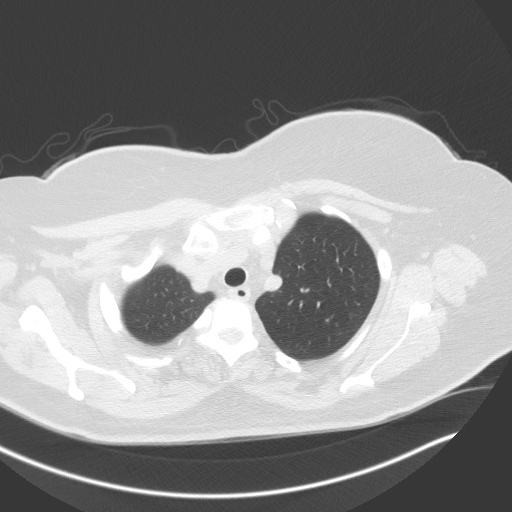
[im 156/169  lung]
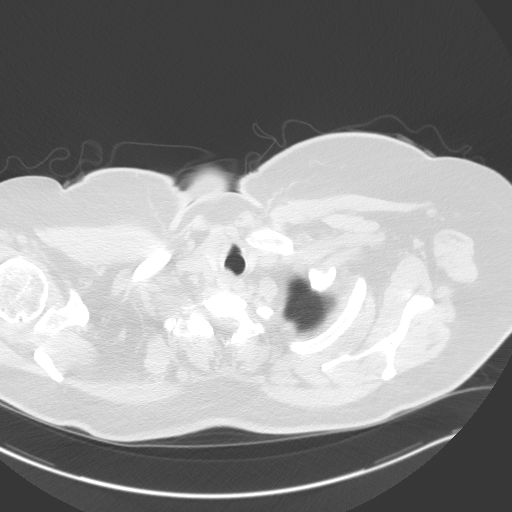

[Series 6: coronal · coronal · 0.78mm/px · 3 of 171 slices shown]
[im 35/171  lung]
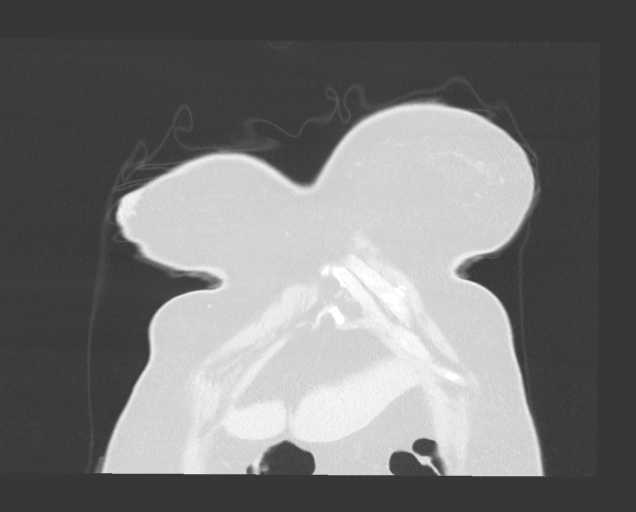
[im 69/171  lung]
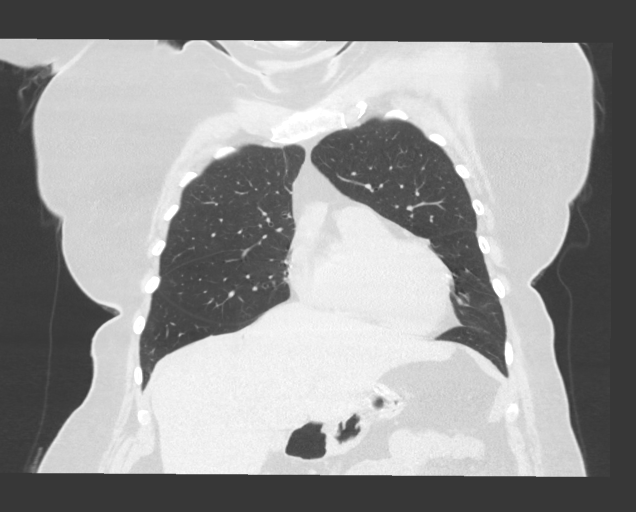
[im 103/171  lung]
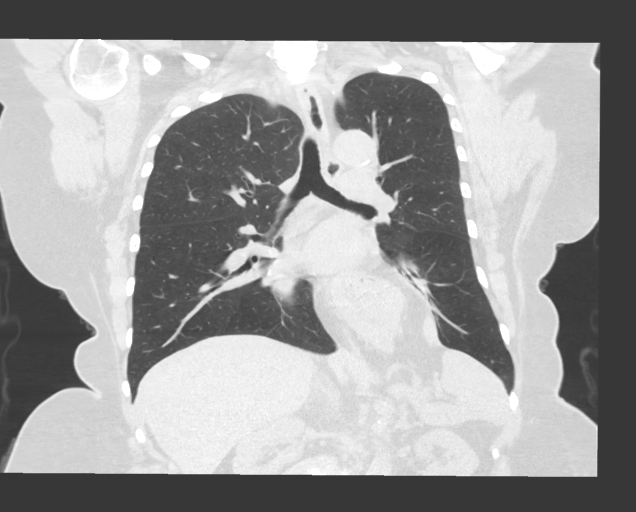

[15 of 36 positions shown; findings below may reference images not displayed]

FINDINGS: Cardiovascular: Heart is normal in size and configuration. Minimal
right and left coronary artery calcifications. No pericardial
effusion. Great vessels are normal in caliber. Minor aortic
atherosclerotic calcifications.

Mediastinum/Nodes: Moderate hiatal hernia, hernia accompanied by
significant herniated fat.

Esophagus is unremarkable. No neck base, mediastinal or hilar masses
or enlarged lymph nodes. Trachea is unremarkable.

Lungs/Pleura: Mild compressive atelectasis in the left lower lobe
adjacent to the hernia. Minor atelectasis or scarring in the medial
left upper lobe lingula and right middle lobe. Lungs otherwise
clear. No pleural effusion or pneumothorax.

Upper Abdomen: No acute abnormality.

Musculoskeletal: No fracture or acute finding. No bone lesion.
S-shaped thoracolumbar scoliosis. No chest wall masses.
IMPRESSION: 1. No acute findings.
2. Moderate size hiatal hernia accompanied by herniated peritoneal
fat.

Aortic Atherosclerosis ([id number]).

## 2023-08-09 ENCOUNTER — Encounter: Payer: Self-pay | Admitting: Internal Medicine

## 2023-08-09 ENCOUNTER — Ambulatory Visit: Payer: Medicare HMO | Attending: Internal Medicine | Admitting: Internal Medicine

## 2023-08-09 VITALS — BP 161/95 | HR 116 | Ht 70.0 in | Wt 250.8 lb

## 2023-08-09 DIAGNOSIS — I471 Supraventricular tachycardia, unspecified: Secondary | ICD-10-CM

## 2023-08-09 DIAGNOSIS — R002 Palpitations: Secondary | ICD-10-CM | POA: Diagnosis not present

## 2023-08-09 NOTE — Patient Instructions (Signed)
Medication Instructions:  Your physician recommends that you continue on your current medications as directed. Please refer to the Current Medication list given to you today.  *If you need a refill on your cardiac medications before your next appointment, please call your pharmacy*   Lab Work: None ordered.  If you have labs (blood work) drawn today and your tests are completely normal, you will receive your results only by: MyChart Message (if you have MyChart) OR A paper copy in the mail If you have any lab test that is abnormal or we need to change your treatment, we will call you to review the results.   Testing/Procedures: None ordered.    Follow-Up: At [facility], you and your health needs are our priority.  As part of our continuing mission to provide you with exceptional heart care, we have created designated Provider Care Teams.  These Care Teams include your primary Cardiologist (physician) and Advanced Practice Providers (APPs -  Physician Assistants and Nurse Practitioners) who all work together to provide you with the care you need, when you need it.  We recommend signing up for the patient portal called "MyChart".  Sign up information is provided on this After Visit Summary.  MyChart is used to connect with patients for Virtual Visits (Telemedicine).  Patients are able to view lab/test results, encounter notes, upcoming appointments, etc.  Non-urgent messages can be sent to your provider as well.   To learn more about what you can do with MyChart, go to [email or web address].    Your next appointment:   3 month telephone visit with Dr Graciela Husbands  Other Instructions  Per Dr Graciela Husbands, Compression wear including thigh sleeves and abdominal binders.  Calf compression is not specifically recommended.  Raise the head of your bed 4 inches and discuss taking a beta blocker with your neurologist  Consider contacting [facility]  [phone]

## 2023-08-09 NOTE — Progress Notes (Signed)
ELECTROPHYSIOLOGY CONSULT NOTE  Patient ID: Olwyn Isler, MRN: [id number], DOB/AGE: 72-Feb-1952 72 y.o. Admit date: (Not on file) Date of Consult: 08/09/2023  Primary Physician: Donita Brooks, MD Primary Cardiologist:  prev HP     Lenix Mccormac is a 72 y.o. female who is seen today for the evaluation of POTS   at the request of HP.    HPI Keanu Grumbine is a 72 y.o. female with lifelong depression who was clinically diagnosed with Lyme disease in 2008 and who since then has suffered with worsening of her chronic fatigue, depression and in the subsequent development of orthostatic tachycardia associated with palpitations dyspnea lightheadedness and nausea.  She has shower intolerance showering with tepid water and with a chair.  Some heat intolerance.  Chronic fatigue and sounds like narcolepsy on modafinil.   Exercise tolerance is now limited to walking around the house mostly.  She sits most of the day, but importantly does not lie down.  Lifelong depression has been worse.  Acknowledges passive death wish but not an active death wish.  Describes recurrent episodes of Lyme, self diagnosed characterized by episodes of fatigue and arthralgias for which she medicates with doxycycline which she obtains from [place] as her PCP will not prescribe it.  Has not ever had Lyme titers.  5/24 hospitalized subacute upper GI bleed  Hx hyperparathyroidism:    DATE TEST EF   12/21 MYOVIEW  55-65 % N ischemia  11/23 Echo  60-65%    Date Cr K Hgb  5//24 0.9 4.8 7.9  7/24 0.75 4.8 14.2      Past Medical History:  Diagnosis Date   Anemia    Anxiety    Arthritis    Chronic fatigue syndrome with fibromyalgia    Depression    Family history of adverse reaction to anesthesia    Mother died on OR table   Fibromyalgia    GERD (gastroesophageal reflux disease)    Glaucoma    Hypertension    IBS (irritable bowel syndrome)    Obesity    POTS (postural orthostatic tachycardia  syndrome)    takes Diltiazem   Primary hyperparathyroidism (HCC)    Scoliosis    Sleep apnea    cpap at 7 cm   Tremor, essential       Surgical History:  Past Surgical History:  Procedure Laterality Date   BIOPSY  02/18/2023   Procedure: BIOPSY;  Surgeon: Beverley Fiedler, MD;  Location: [facility] ENDOSCOPY;  Service: Gastroenterology;;   COLONOSCOPY  10/2021   DILATION AND CURETTAGE OF UTERUS     ENTEROSCOPY N/A 02/18/2023   Procedure: ENTEROSCOPY;  Surgeon: Beverley Fiedler, MD;  Location: [facility] ENDOSCOPY;  Service: Gastroenterology;  Laterality: N/A;   ESOPHAGOGASTRODUODENOSCOPY  10/2021   PARATHYROIDECTOMY Left 05/16/2023   Procedure: LEFT INFERIOR PARATHYROIDECTOMY;  Surgeon: Darnell Level, MD;  Location: [facility] ORS;  Service: General;  Laterality: Left;  FROZEN SECTION 2ND SCRUB PERSON   PCOS surgery  1971   wedge resection of both ovaries   TONSILECTOMY, ADENOIDECTOMY, BILATERAL MYRINGOTOMY AND TUBES  1962     Home Meds: Current Meds  Medication Sig   acetaminophen (TYLENOL) 500 MG tablet Take 1,000 mg by mouth every 8 (eight) hours as needed for mild pain (pain score 1-3).   ARIPiprazole (ABILIFY) 5 MG tablet Take 1 tablet (5 mg total) by mouth daily.   diltiazem (CARDIZEM) 30 MG tablet Take 1 tablet (30 mg total) by mouth 2 (two) times  daily.   ferrous sulfate 325 (65 FE) MG tablet Take 325 mg by mouth daily with breakfast.   fexofenadine (ALLEGRA) 180 MG tablet Take 180 mg by mouth daily.   folic acid (FOLVITE) 1 MG tablet Take 1 tablet (1 mg total) by mouth daily.   guaiFENesin (MUCINEX) 600 MG 12 hr tablet Take 600 mg by mouth daily as needed for cough or to loosen phlegm.   losartan (COZAAR) 50 MG tablet Take 50 mg by mouth daily.   modafinil (PROVIGIL) 200 MG tablet Take 1 tablet by mouth in the morning and at bedtime.   Multiple Vitamin (MULTIVITAMIN ADULT PO) Take 1 tablet by mouth daily.   pantoprazole (PROTONIX) 40 MG tablet Take 1 tablet (40 mg total) by mouth 2 (two) times daily  before a meal.   sertraline (ZOLOFT) 100 MG tablet Take 1 tablet (100 mg total) by mouth daily.   Vitamin D-Vitamin K (VITAMIN K2-VITAMIN D3 PO) Take 1 tablet by mouth daily.    Allergies:  Allergies  Allergen Reactions   Asa [Aspirin] Hives   Codeine Nausea And Vomiting   Statins Nausea Only    Malaise     Social History   Socioeconomic History   Marital status: Married    Spouse name: Not on file   Number of children: 0   Years of education: Not on file   Highest education level: Bachelor's degree (e.g., BA, AB, BS)  Occupational History   Occupation: retired  Tobacco Use   Smoking status: Former    Current packs/day: 0.00    Types: Cigarettes    Start date: 10/12/2003    Quit date: 10/11/2009    Years since quitting: 13.8   Smokeless tobacco: Never  Vaping Use   Vaping status: Former   Devices: vaped to stop smoking  Substance and Sexual Activity   Alcohol use: Yes    Comment: Occasional wine   Drug use: No   Sexual activity: Not Currently  Other Topics Concern   Not on file  Social History Narrative   Not on file   Social Determinants of Health   Financial Resource Strain: Low Risk  (06/23/2023)   Overall Financial Resource Strain (CARDIA)    Difficulty of Paying Living Expenses: Not very hard  Food Insecurity: Food Insecurity Present (06/23/2023)   Hunger Vital Sign    Worried About Running Out of Food in the Last Year: Sometimes true    Ran Out of Food in the Last Year: Never true  Transportation Needs: No Transportation Needs (06/23/2023)   PRAPARE - Administrator, Civil Service (Medical): No    Lack of Transportation (Non-Medical): No  Physical Activity: Inactive (06/23/2023)   Exercise Vital Sign    Days of Exercise per Week: 0 days    Minutes of Exercise per Session: 0 min  Stress: No Stress Concern Present (06/23/2023)   [employer] of Occupational Health - Occupational Stress Questionnaire    Feeling of Stress : Only a little   Social Connections: Moderately Integrated (06/23/2023)   Social Connection and Isolation Panel [NHANES]    Frequency of Communication with Friends and Family: Once a week    Frequency of Social Gatherings with Friends and Family: Once a week    Attends Religious Services: More than 4 times per year    Active Member of [employer] or Organizations: Yes    Attends Engineer, structural: More than 4 times per year    Marital Status: Married  Intimate Partner Violence: Not At Risk (06/23/2023)   Humiliation, Afraid, Rape, and Kick questionnaire    Fear of Current or Ex-Partner: No    Emotionally Abused: No    Physically Abused: No    Sexually Abused: No     Family History  Problem Relation Age of Onset   Hypertension Mother    Melanoma Mother        wrist   Other Mother        brain tumor   Stroke Mother    Hyperlipidemia Mother    Cancer Father    Hyperlipidemia Father    Hypertension Father    Colon polyps Father        not cancerous   Kidney failure Father    Breast cancer Sister    Stomach cancer Maternal Uncle    Breast cancer Paternal Aunt    Colon cancer Paternal Aunt    Irritable bowel syndrome Paternal Aunt    Esophageal cancer Neg Hx    Rectal cancer Neg Hx      ROS:  Please see the history of present illness.     All other systems reviewed and negative.    Physical Exam: Blood pressure (!) 161/95, pulse (!) 116, height 5\' 10"  (1.778 m), weight 250 lb 12.8 oz (113.8 kg), SpO2 99%. General: Well developed, Morbidly obese  female in no acute distress. Head: Normocephalic, atraumatic, sclera non-icteric, no xanthomas, nares are without discharge. EENT: normal  Lymph Nodes:  none Neck: Negative for carotid bruits. JVD not elevated. Back:without scoliosis kyphosis Lungs: Clear bilaterally to auscultation without wheezes, rales, or rhonchi. Breathing is unlabored. Heart: RRR with S1 S2. No  murmur . No rubs, or gallops appreciated. Abdomen: Soft, non-tender,  non-distended with normoactive bowel sounds.   Msk:  Strength and tone appear normal for age. Extremities: No clubbing or cyanosis. No edema.  Distal pedal pulses are 2+ and equal bilaterally. Skin: Warm and Dry Neuro: Alert and oriented X 3. CN III-XII intact Grossly normal sensory and motor function . Psych:  Responds to questions appropriately with a normal affect.        EKG: sinus @ 86 21/10/38   Assessment and Plan:  Orthostatic tachycardia and hypertension  History of Lyme disease  Chronic fatigue and generalized debility  Question narcolepsy/obstructive sleep apnea  Morbid obesity   The patient has orthostatic tachycardia and hypertension.  By history has been going on 20 years.  She does not fit the demographic of POTS; however, her postural tachycardia is consistent with that diagnosis.  This degree of hypertension suggests a hyperadrenergic form which is also supported by the tremulousness.  We discussed extensively the issues of dysautonomia, the physiology of orthstasis and positional stress.  We discussed the role of salt and water repletion, the importance of exercise, often needing to be started in the recumbent position, and the awareness of triggers and the role of ambient heat and dehydration  Trying to mitigate the triggering of orthostasis, suggested, as she raises a concern that had not heard before about beta-blockers and dementia (see below), we discussed compression and isometric contraction prior to standing as well as raising the head of her bed 4-6 inches.  We also stressed the importance of aerobic exercise suggest either pool exercise or recumbent exercise and possibly even a CUBII.  Mechanistically, she seems to have excessive adrenergic tone with standing.  In addition to trying to decrease the triggers, would like to use a beta-blocker.  I have reached  out to neurology as she is on modafinil to make sure that we are not across purposes.  Her question  as to dementia and beta-blockers has a published literature particularly with people with cognitive impairment already and in association with vascular but not mixed or Alzheimer's dementia.  Also recommended she consider restoration place for ongoing whole person support           Sherryl Manges

## 2023-08-19 ENCOUNTER — Other Ambulatory Visit: Payer: Self-pay | Admitting: Medical Genetics

## 2023-08-19 DIAGNOSIS — Z006 Encounter for examination for normal comparison and control in clinical research program: Secondary | ICD-10-CM

## 2023-08-26 ENCOUNTER — Other Ambulatory Visit: Payer: Self-pay | Admitting: Family Medicine

## 2023-08-26 DIAGNOSIS — G4733 Obstructive sleep apnea (adult) (pediatric): Secondary | ICD-10-CM

## 2023-08-29 NOTE — Telephone Encounter (Signed)
Requested medications are due for refill today.  yes  Requested medications are on the active medications list.  yes  Last refill. 05/05/2023 #180 0 rf  Future visit scheduled.   no  Notes to clinic.  Medication not assigned to a protocol. Please review for refill.    Requested Prescriptions  Pending Prescriptions Disp Refills   modafinil (PROVIGIL) 200 MG tablet [Pharmacy Med Name: Modafinil 200mg  Tablet] 180 tablet     Sig: Take 1 tablet by mouth in the morning and at bedtime.     Off-Protocol Failed - 08/26/2023  3:40 PM      Failed - Medication not assigned to a protocol, review manually.      Failed - Valid encounter within last 12 months    Recent Outpatient Visits           1 year ago Change in stool   [facility] Pickard, Priscille Heidelberg, MD   1 year ago Hypercalcemia   [facility] Medicine Pickard, Priscille Heidelberg, MD   1 year ago Primary hypertension   [facility] Medicine Tanya Nones, Priscille Heidelberg, MD   2 years ago Microcytosis   [facility] Pickard, Priscille Heidelberg, MD   2 years ago Nausea   [facility] Pickard, Priscille Heidelberg, MD

## 2023-08-30 ENCOUNTER — Encounter: Payer: Self-pay | Admitting: Family Medicine

## 2023-08-30 ENCOUNTER — Other Ambulatory Visit: Payer: Self-pay | Admitting: Family Medicine

## 2023-08-30 MED ORDER — RABEPRAZOLE SODIUM 20 MG PO TBEC: 20.0000 mg | DELAYED_RELEASE_TABLET | Freq: Every day | ORAL | 3 refills | Status: DC

## 2023-09-05 ENCOUNTER — Other Ambulatory Visit: Payer: Self-pay | Admitting: Family Medicine

## 2023-09-05 NOTE — Telephone Encounter (Signed)
Prescription Request  09/05/2023  LOV: 05/24/2023  What is the name of the medication or equipment? modafinil (PROVIGIL) 200 MG tablet [[id number]]   Have you contacted your pharmacy to request a refill? Yes   Which pharmacy would you like this sent to?  [email or web address] - [facility] - [place], [place] - [address] [place] [address] Phone: [phone]  Fax: [phone]   Patient notified that their request is being sent to the clinical staff for review and that they should receive a response within 2 business days.   Please advise at [facility] [phone]

## 2023-09-06 ENCOUNTER — Other Ambulatory Visit: Payer: Self-pay

## 2023-09-06 DIAGNOSIS — G4733 Obstructive sleep apnea (adult) (pediatric): Secondary | ICD-10-CM

## 2023-09-06 MED ORDER — MODAFINIL 200 MG PO TABS
ORAL_TABLET | ORAL | 0 refills | Status: DC
Start: 2023-09-06 — End: 2023-09-07

## 2023-09-06 NOTE — Telephone Encounter (Signed)
Requested medications are due for refill today.  unsure   Requested medications are on the active medications list.  Yes, as historical   Last refill. 02/15/2023    Future visit scheduled.   no   Notes to clinic.  Medication is historical. Pt needs a CPE.   Requested Prescriptions  Pending Prescriptions Disp Refills   losartan (COZAAR) 50 MG tablet [Pharmacy Med Name: Losartan Potassium 50mg  Tablet] 180 tablet 2    Sig: Take 1 tablet by mouth twice daily.     Cardiovascular:  Angiotensin Receptor Blockers Failed - 09/05/2023  9:53 AM      Failed - Last BP in normal range    BP Readings from Last 1 Encounters:  08/09/23 (!) 161/95         Failed - Valid encounter within last 6 months    Recent Outpatient Visits           1 year ago Change in stool   [facility] Medicine Pickard, Priscille Heidelberg, MD   1 year ago Hypercalcemia   [facility] Medicine Tanya Nones, Priscille Heidelberg, MD   1 year ago Primary hypertension   [facility] Medicine Tanya Nones, Priscille Heidelberg, MD   2 years ago Microcytosis   [facility] Tanya Nones, Priscille Heidelberg, MD   2 years ago Nausea   [facility] Medicine Tanya Nones, Priscille Heidelberg, MD              Passed - Cr in normal range and within 180 days    Creat  Date Value Ref Range Status  03/04/2023 0.90 0.60 - 1.00 mg/dL Final   Creatinine, Ser  Date Value Ref Range Status  04/21/2023 0.75 0.44 - 1.00 mg/dL Final         Passed - K in normal range and within 180 days    Potassium  Date Value Ref Range Status  04/21/2023 4.8 3.5 - 5.1 mmol/L Final         Passed - Patient is not pregnant

## 2023-09-06 NOTE — Telephone Encounter (Signed)
Requested medications are due for refill today.  unsure  Requested medications are on the active medications list.  Yes, as historical  Last refill. 02/15/2023   Future visit scheduled.   no  Notes to clinic.  Medication is historical. Pt needs a CPE.    Requested Prescriptions  Pending Prescriptions Disp Refills   losartan (COZAAR) 50 MG tablet [Pharmacy Med Name: Losartan Potassium 50mg  Tablet] 180 tablet 2    Sig: Take 1 tablet by mouth twice daily.     Cardiovascular:  Angiotensin Receptor Blockers Failed - 09/05/2023  9:53 AM      Failed - Last BP in normal range    BP Readings from Last 1 Encounters:  08/09/23 (!) 161/95         Failed - Valid encounter within last 6 months    Recent Outpatient Visits           1 year ago Change in stool   [facility] Medicine Pickard, Priscille Heidelberg, MD   1 year ago Hypercalcemia   [facility] Medicine Tanya Nones, Priscille Heidelberg, MD   1 year ago Primary hypertension   [facility] Tanya Nones, Priscille Heidelberg, MD   2 years ago Microcytosis   [facility] Tanya Nones, Priscille Heidelberg, MD   2 years ago Nausea   [facility] Medicine Tanya Nones, Priscille Heidelberg, MD              Passed - Cr in normal range and within 180 days    Creat  Date Value Ref Range Status  03/04/2023 0.90 0.60 - 1.00 mg/dL Final   Creatinine, Ser  Date Value Ref Range Status  04/21/2023 0.75 0.44 - 1.00 mg/dL Final         Passed - K in normal range and within 180 days    Potassium  Date Value Ref Range Status  04/21/2023 4.8 3.5 - 5.1 mmol/L Final         Passed - Patient is not pregnant

## 2023-09-07 ENCOUNTER — Other Ambulatory Visit: Payer: Self-pay | Admitting: Family Medicine

## 2023-09-07 DIAGNOSIS — G4733 Obstructive sleep apnea (adult) (pediatric): Secondary | ICD-10-CM

## 2023-09-07 MED ORDER — MODAFINIL 200 MG PO TABS
ORAL_TABLET | ORAL | 0 refills | Status: DC
Start: 2023-09-07 — End: 2023-09-07

## 2023-09-07 MED ORDER — MODAFINIL 200 MG PO TABS
ORAL_TABLET | ORAL | 0 refills | Status: DC
Start: 2023-09-07 — End: 2023-11-28

## 2023-09-14 ENCOUNTER — Other Ambulatory Visit (HOSPITAL_COMMUNITY): Payer: Medicare HMO

## 2023-09-29 DIAGNOSIS — H401421 Capsular glaucoma with pseudoexfoliation of lens, left eye, mild stage: Secondary | ICD-10-CM | POA: Diagnosis not present

## 2023-10-31 ENCOUNTER — Other Ambulatory Visit: Payer: Self-pay | Admitting: Family Medicine

## 2023-10-31 ENCOUNTER — Other Ambulatory Visit: Payer: Self-pay

## 2023-10-31 DIAGNOSIS — I10 Essential (primary) hypertension: Secondary | ICD-10-CM

## 2023-10-31 MED ORDER — LOSARTAN POTASSIUM 100 MG PO TABS
100.0000 mg | ORAL_TABLET | Freq: Every day | ORAL | 1 refills | Status: DC
Start: 2023-10-31 — End: 2024-04-20

## 2023-11-10 ENCOUNTER — Telehealth: Payer: Medicare HMO | Admitting: Internal Medicine

## 2023-11-11 DIAGNOSIS — Z008 Encounter for other general examination: Secondary | ICD-10-CM | POA: Diagnosis not present

## 2023-11-22 DIAGNOSIS — H25813 Combined forms of age-related cataract, bilateral: Secondary | ICD-10-CM | POA: Diagnosis not present

## 2023-11-22 DIAGNOSIS — H31091 Other chorioretinal scars, right eye: Secondary | ICD-10-CM | POA: Diagnosis not present

## 2023-11-22 DIAGNOSIS — H353131 Nonexudative age-related macular degeneration, bilateral, early dry stage: Secondary | ICD-10-CM | POA: Diagnosis not present

## 2023-11-22 DIAGNOSIS — H401421 Capsular glaucoma with pseudoexfoliation of lens, left eye, mild stage: Secondary | ICD-10-CM | POA: Diagnosis not present

## 2023-11-26 ENCOUNTER — Other Ambulatory Visit: Payer: Self-pay | Admitting: Family Medicine

## 2023-11-26 DIAGNOSIS — G4733 Obstructive sleep apnea (adult) (pediatric): Secondary | ICD-10-CM

## 2023-12-06 ENCOUNTER — Ambulatory Visit
Admission: RE | Admit: 2023-12-06 | Discharge: 2023-12-06 | Disposition: A | Discharge status: Home / Self Care | Payer: Medicare HMO | Source: Ambulatory Visit | Attending: Family Medicine | Admitting: Family Medicine

## 2023-12-06 DIAGNOSIS — Z1231 Encounter for screening mammogram for malignant neoplasm of breast: Secondary | ICD-10-CM | POA: Diagnosis not present

## 2023-12-08 ENCOUNTER — Encounter: Payer: Self-pay | Admitting: Family Medicine

## 2023-12-08 ENCOUNTER — Ambulatory Visit (INDEPENDENT_AMBULATORY_CARE_PROVIDER_SITE_OTHER): Payer: Medicare HMO | Admitting: Family Medicine

## 2023-12-08 VITALS — BP 136/84 | HR 97 | Temp 98.2°F | Ht 70.0 in | Wt 250.0 lb

## 2023-12-08 DIAGNOSIS — R5383 Other fatigue: Secondary | ICD-10-CM | POA: Diagnosis not present

## 2023-12-08 DIAGNOSIS — D500 Iron deficiency anemia secondary to blood loss (chronic): Secondary | ICD-10-CM | POA: Diagnosis not present

## 2023-12-08 NOTE — Progress Notes (Signed)
 Subjective:    Patient ID: Melanie Hurst, female    DOB: 1950/11/25, 73 y.o.   MRN: [id number]  Patient states that she has no energy.  She states that she felt like this last year when she was diagnosed with anemia.  Her last hemoglobin was July and at that time it was over 14.  She denies any melena or hematochezia.  She denies any chest pain or shortness of breath.  She denies any dyspnea on exertion orthopnea.  She denies any fevers or chills.  She denies any night sweats or bone pain.  She denies any abdominal pain or nausea or vomiting Past Medical History:  Diagnosis Date   Anemia    Anxiety    Arthritis    Chronic fatigue syndrome with fibromyalgia    Depression    Family history of adverse reaction to anesthesia    Mother died on OR table   Fibromyalgia    GERD (gastroesophageal reflux disease)    Glaucoma    Hypertension    IBS (irritable bowel syndrome)    Obesity    POTS (postural orthostatic tachycardia syndrome)    takes Diltiazem   Primary hyperparathyroidism (HCC)    Scoliosis    Sleep apnea    cpap at 7 cm   Tremor, essential    Past Surgical History:  Procedure Laterality Date   BIOPSY  02/18/2023   Procedure: BIOPSY;  Surgeon: Beverley Fiedler, MD;  Location: [facility] ENDOSCOPY;  Service: Gastroenterology;;   COLONOSCOPY  10/2021   DILATION AND CURETTAGE OF UTERUS     ENTEROSCOPY N/A 02/18/2023   Procedure: ENTEROSCOPY;  Surgeon: Beverley Fiedler, MD;  Location: [facility] ENDOSCOPY;  Service: Gastroenterology;  Laterality: N/A;   ESOPHAGOGASTRODUODENOSCOPY  10/2021   PARATHYROIDECTOMY Left 05/16/2023   Procedure: LEFT INFERIOR PARATHYROIDECTOMY;  Surgeon: Darnell Level, MD;  Location: [facility] ORS;  Service: General;  Laterality: Left;  FROZEN SECTION 2ND SCRUB PERSON   PCOS surgery  1971   wedge resection of both ovaries   TONSILECTOMY, ADENOIDECTOMY, BILATERAL MYRINGOTOMY AND TUBES  1962   Current Outpatient Medications on File Prior to Visit  Medication Sig Dispense Refill    acetaminophen (TYLENOL) 500 MG tablet Take 1,000 mg by mouth every 8 (eight) hours as needed for mild pain (pain score 1-3).     ARIPiprazole (ABILIFY) 5 MG tablet Take 1 tablet (5 mg total) by mouth daily. 90 tablet 3   diltiazem (CARDIZEM) 30 MG tablet Take 1 tablet (30 mg total) by mouth 2 (two) times daily. 180 tablet 3   folic acid (FOLVITE) 1 MG tablet Take 1 tablet (1 mg total) by mouth daily. 30 tablet 0   losartan (COZAAR) 100 MG tablet Take 1 tablet (100 mg total) by mouth daily. 90 tablet 1   modafinil (PROVIGIL) 200 MG tablet TAKE 1 TABLET BY MOUTH IN THE MORNING AND IN THE EVENING 180 tablet 0   Multiple Vitamin (MULTIVITAMIN ADULT PO) Take 1 tablet by mouth daily.     RABEprazole (ACIPHEX) 20 MG tablet Take 1 tablet (20 mg total) by mouth daily. 30 tablet 3   sertraline (ZOLOFT) 100 MG tablet Take 1 tablet (100 mg total) by mouth daily. 90 tablet 3   Vitamin D-Vitamin K (VITAMIN K2-VITAMIN D3 PO) Take 1 tablet by mouth daily.     No current facility-administered medications on file prior to visit.   Allergies  Allergen Reactions   Asa [Aspirin] Hives   Codeine Nausea And Vomiting   Statins  Nausea Only    Malaise    Social History   Socioeconomic History   Marital status: Married    Spouse name: Not on file   Number of children: 0   Years of education: Not on file   Highest education level: Bachelor's degree (e.g., BA, AB, BS)  Occupational History   Occupation: retired  Tobacco Use   Smoking status: Former    Current packs/day: 0.00    Types: Cigarettes    Start date: 10/12/2003    Quit date: 10/11/2009    Years since quitting: 14.1   Smokeless tobacco: Never  Vaping Use   Vaping status: Former   Devices: vaped to stop smoking  Substance and Sexual Activity   Alcohol use: Yes    Comment: Occasional wine   Drug use: No   Sexual activity: Not Currently  Other Topics Concern   Not on file  Social History Narrative   Not on file   Social Drivers of Health    Financial Resource Strain: Medium Risk (12/06/2023)   Overall Financial Resource Strain (CARDIA)    Difficulty of Paying Living Expenses: Somewhat hard  Food Insecurity: No Food Insecurity (12/06/2023)   Hunger Vital Sign    Worried About Running Out of Food in the Last Year: Never true    Ran Out of Food in the Last Year: Never true  Transportation Needs: No Transportation Needs (12/06/2023)   PRAPARE - Administrator, Civil Service (Medical): No    Lack of Transportation (Non-Medical): No  Physical Activity: Inactive (12/06/2023)   Exercise Vital Sign    Days of Exercise per Week: 0 days    Minutes of Exercise per Session: 0 min  Stress: No Stress Concern Present (12/06/2023)   [employer] of Occupational Health - Occupational Stress Questionnaire    Feeling of Stress : Only a little  Social Connections: Moderately Integrated (12/06/2023)   Social Connection and Isolation Panel [NHANES]    Frequency of Communication with Friends and Family: Once a week    Frequency of Social Gatherings with Friends and Family: Once a week    Attends Religious Services: More than 4 times per year    Active Member of [employer] or Organizations: Yes    Attends Banker Meetings: More than 4 times per year    Marital Status: Married  Catering manager Violence: Not At Risk (06/23/2023)   Humiliation, Afraid, Rape, and Kick questionnaire    Fear of Current or Ex-Partner: No    Emotionally Abused: No    Physically Abused: No    Sexually Abused: No      Review of Systems  All other systems reviewed and are negative.      Objective:   Physical Exam Vitals reviewed.  Constitutional:      Appearance: She is well-developed. She is obese. She is not diaphoretic.  Cardiovascular:     Rate and Rhythm: Normal rate and regular rhythm.     Heart sounds: Normal heart sounds. No murmur heard. Pulmonary:     Effort: Pulmonary effort is normal. No respiratory distress.     Breath  sounds: Normal breath sounds. No wheezing.  Abdominal:     General: Bowel sounds are normal. There is no distension.     Palpations: Abdomen is soft.     Tenderness: There is no abdominal tenderness. There is no guarding or rebound.  Musculoskeletal:     Right lower leg: No edema.     Left lower  leg: No edema.  Neurological:     Mental Status: She is alert.     Motor: No abnormal muscle tone.     Deep Tendon Reflexes: Reflexes are normal and symmetric.           Assessment & Plan:  Fatigue, unspecified type - Plan: CBC with Differential/Platelet, COMPLETE METABOLIC PANEL WITH GFR, TSH, Ferritin, Iron, Vitamin B12 Exam today is reassuring.  I will check a CBC, CMP, TSH, ferritin/iron/B12 level.  Review of systems does not point to another potential cause of fatigue.  Await the results of the lab work.

## 2023-12-09 LAB — CBC WITH DIFFERENTIAL/PLATELET
Absolute Lymphocytes: 1926 {cells}/uL (ref 850–3900)
Absolute Monocytes: 431 {cells}/uL (ref 200–950)
Basophils Absolute: 50 {cells}/uL (ref 0–200)
Basophils Relative: 0.9 %
Eosinophils Absolute: 112 {cells}/uL (ref 15–500)
Eosinophils Relative: 2 %
HCT: 41.7 % (ref 35.0–45.0)
Hemoglobin: 12.5 g/dL (ref 11.7–15.5)
MCH: 25 pg — ABNORMAL LOW (ref 27.0–33.0)
MCHC: 30 g/dL — ABNORMAL LOW (ref 32.0–36.0)
MCV: 83.2 fL (ref 80.0–100.0)
MPV: 9 fL (ref 7.5–12.5)
Monocytes Relative: 7.7 %
Neutro Abs: 3080 {cells}/uL (ref 1500–7800)
Neutrophils Relative %: 55 %
Platelets: 340 10*3/uL (ref 140–400)
RBC: 5.01 10*6/uL (ref 3.80–5.10)
RDW: 14.9 % (ref 11.0–15.0)
Total Lymphocyte: 34.4 %
WBC: 5.6 10*3/uL (ref 3.8–10.8)

## 2023-12-09 LAB — COMPLETE METABOLIC PANEL WITH GFR
AG Ratio: 2.3 (calc) (ref 1.0–2.5)
ALT: 10 U/L (ref 6–29)
AST: 12 U/L (ref 10–35)
Albumin: 4.5 g/dL (ref 3.6–5.1)
Alkaline phosphatase (APISO): 96 U/L (ref 37–153)
BUN: 16 mg/dL (ref 7–25)
CO2: 29 mmol/L (ref 20–32)
Calcium: 9.6 mg/dL (ref 8.6–10.4)
Chloride: 104 mmol/L (ref 98–110)
Creat: 0.77 mg/dL (ref 0.60–1.00)
Globulin: 2 g/dL (ref 1.9–3.7)
Glucose, Bld: 94 mg/dL (ref 65–99)
Potassium: 4.9 mmol/L (ref 3.5–5.3)
Sodium: 141 mmol/L (ref 135–146)
Total Bilirubin: 0.5 mg/dL (ref 0.2–1.2)
Total Protein: 6.5 g/dL (ref 6.1–8.1)
eGFR: 82 mL/min/{1.73_m2} (ref >=60)

## 2023-12-09 LAB — VITAMIN B12: Vitamin B-12: 774 pg/mL (ref 200–1100)

## 2023-12-09 LAB — FERRITIN: Ferritin: 4 ng/mL — ABNORMAL LOW (ref 16–288)

## 2023-12-09 LAB — TSH: TSH: 2.26 m[IU]/L (ref 0.40–4.50)

## 2023-12-09 LAB — IRON: Iron: 50 ug/dL (ref 45–160)

## 2023-12-20 ENCOUNTER — Other Ambulatory Visit: Payer: Self-pay | Admitting: Family Medicine

## 2023-12-21 NOTE — Telephone Encounter (Signed)
 LOV 12/08/2023 with Dr. Tanya Nones.  Requested Prescriptions  Pending Prescriptions Disp Refills   RABEprazole (ACIPHEX) 20 MG tablet [Pharmacy Med Name: RABEPRAZOLE SOD DR 20 MG TAB] 30 tablet 3    Sig: TAKE 1 TABLET BY MOUTH EVERY DAY     Gastroenterology: Proton Pump Inhibitors Failed - 12/21/2023  8:05 AM      Failed - Valid encounter within last 12 months    Recent Outpatient Visits           1 year ago Change in stool   [facility] Medicine Pickard, Priscille Heidelberg, MD   1 year ago Hypercalcemia   [facility] Medicine Donita Brooks, MD   2 years ago Primary hypertension   [facility] Tanya Nones, Priscille Heidelberg, MD   2 years ago Microcytosis   [facility] Pickard, Priscille Heidelberg, MD   2 years ago Nausea   [facility] Medicine Pickard, Priscille Heidelberg, MD

## 2023-12-28 ENCOUNTER — Telehealth: Payer: Self-pay | Admitting: Family Medicine

## 2023-12-28 NOTE — Telephone Encounter (Signed)
 Prior auth for Modafinil 200 mg tablets KEY: [id number]

## 2024-01-09 ENCOUNTER — Encounter: Payer: Self-pay | Admitting: Family Medicine

## 2024-01-13 ENCOUNTER — Ambulatory Visit (INDEPENDENT_AMBULATORY_CARE_PROVIDER_SITE_OTHER): Admitting: Family Medicine

## 2024-01-13 ENCOUNTER — Encounter: Payer: Self-pay | Admitting: Family Medicine

## 2024-01-13 VITALS — BP 128/78 | HR 81 | Temp 98.5°F | Ht 70.0 in | Wt 269.4 lb

## 2024-01-13 DIAGNOSIS — R1013 Epigastric pain: Secondary | ICD-10-CM

## 2024-01-13 DIAGNOSIS — K921 Melena: Secondary | ICD-10-CM | POA: Diagnosis not present

## 2024-01-13 LAB — HEMOGLOBIN, FINGERSTICK: POC HEMOGLOBIN: 11.6 g/dL — ABNORMAL LOW (ref 12.0–15.0)

## 2024-01-13 MED ORDER — SUCRALFATE 1 G PO TABS: 1.0000 g | ORAL_TABLET | Freq: Three times a day (TID) | ORAL | 1 refills | Status: AC

## 2024-01-13 NOTE — Progress Notes (Signed)
 Subjective:    Patient ID: Melanie Hurst, female    DOB: September 15, 1951, 73 y.o.   MRN: [id number] Patient has a history of acid reflux, a hiatal hernia, and bleeding due to angioectasias in the past.  She states that over the last 15 days she has been having diarrhea.  Now the stool has turned black.  She also reports epigastric pain.  She describes it as a burning pain similar to her previous ulcer.  She states that the pain comes and goes but it is in the epigastric area.  She denies any right upper quadrant pain.  She denies any radiation of the pain into her chest or into her shoulder.  She does have nausea but this is a chronic problem.  She denies any hematochezia.  She denies any fevers or chills.  I recently drew blood work in February and hemoglobin was 12.5.  Fingerstick hemoglobin today was 11.6.  Patient does not have hypotension or tachycardia Past Medical History:  Diagnosis Date   Anemia    Anxiety    Arthritis    Chronic fatigue syndrome with fibromyalgia    Depression    Family history of adverse reaction to anesthesia    Mother died on OR table   Fibromyalgia    GERD (gastroesophageal reflux disease)    Glaucoma    Hypertension    IBS (irritable bowel syndrome)    Obesity    POTS (postural orthostatic tachycardia syndrome)    takes Diltiazem   Primary hyperparathyroidism (HCC)    Scoliosis    Sleep apnea    cpap at 7 cm   Tremor, essential    Past Surgical History:  Procedure Laterality Date   BIOPSY  02/18/2023   Procedure: BIOPSY;  Surgeon: Beverley Fiedler, MD;  Location: [facility] ENDOSCOPY;  Service: Gastroenterology;;   COLONOSCOPY  10/2021   DILATION AND CURETTAGE OF UTERUS     ENTEROSCOPY N/A 02/18/2023   Procedure: ENTEROSCOPY;  Surgeon: Beverley Fiedler, MD;  Location: [facility] ENDOSCOPY;  Service: Gastroenterology;  Laterality: N/A;   ESOPHAGOGASTRODUODENOSCOPY  10/2021   PARATHYROIDECTOMY Left 05/16/2023   Procedure: LEFT INFERIOR PARATHYROIDECTOMY;  Surgeon: Darnell Level,  MD;  Location: [facility] ORS;  Service: General;  Laterality: Left;  FROZEN SECTION 2ND SCRUB PERSON   PCOS surgery  1971   wedge resection of both ovaries   TONSILECTOMY, ADENOIDECTOMY, BILATERAL MYRINGOTOMY AND TUBES  1962   Current Outpatient Medications on File Prior to Visit  Medication Sig Dispense Refill   acetaminophen (TYLENOL) 500 MG tablet Take 1,000 mg by mouth every 8 (eight) hours as needed for mild pain (pain score 1-3).     ARIPiprazole (ABILIFY) 5 MG tablet Take 1 tablet (5 mg total) by mouth daily. 90 tablet 3   diltiazem (CARDIZEM) 30 MG tablet Take 1 tablet (30 mg total) by mouth 2 (two) times daily. 180 tablet 3   folic acid (FOLVITE) 1 MG tablet Take 1 tablet (1 mg total) by mouth daily. 30 tablet 0   losartan (COZAAR) 100 MG tablet Take 1 tablet (100 mg total) by mouth daily. 90 tablet 1   modafinil (PROVIGIL) 200 MG tablet TAKE 1 TABLET BY MOUTH IN THE MORNING AND IN THE EVENING 180 tablet 0   Multiple Vitamin (MULTIVITAMIN ADULT PO) Take 1 tablet by mouth daily.     RABEprazole (ACIPHEX) 20 MG tablet TAKE 1 TABLET BY MOUTH EVERY DAY 30 tablet 3   sertraline (ZOLOFT) 100 MG tablet Take 1 tablet (100 mg  total) by mouth daily. 90 tablet 3   Vitamin D-Vitamin K (VITAMIN K2-VITAMIN D3 PO) Take 1 tablet by mouth daily.     No current facility-administered medications on file prior to visit.   Allergies  Allergen Reactions   Asa [Aspirin] Hives   Codeine Nausea And Vomiting   Statins Nausea Only    Malaise    Social History   Socioeconomic History   Marital status: Married    Spouse name: Not on file   Number of children: 0   Years of education: Not on file   Highest education level: Bachelor's degree (e.g., BA, AB, BS)  Occupational History   Occupation: retired  Tobacco Use   Smoking status: Former    Current packs/day: 0.00    Types: Cigarettes    Start date: 10/12/2003    Quit date: 10/11/2009    Years since quitting: 14.2   Smokeless tobacco: Never  Vaping  Use   Vaping status: Former   Devices: vaped to stop smoking  Substance and Sexual Activity   Alcohol use: Yes    Comment: Occasional wine   Drug use: No   Sexual activity: Not Currently  Other Topics Concern   Not on file  Social History Narrative   Not on file   Social Drivers of Health   Financial Resource Strain: Medium Risk (12/06/2023)   Overall Financial Resource Strain (CARDIA)    Difficulty of Paying Living Expenses: Somewhat hard  Food Insecurity: No Food Insecurity (12/06/2023)   Hunger Vital Sign    Worried About Running Out of Food in the Last Year: Never true    Ran Out of Food in the Last Year: Never true  Transportation Needs: No Transportation Needs (12/06/2023)   PRAPARE - Administrator, Civil Service (Medical): No    Lack of Transportation (Non-Medical): No  Physical Activity: Inactive (12/06/2023)   Exercise Vital Sign    Days of Exercise per Week: 0 days    Minutes of Exercise per Session: 0 min  Stress: No Stress Concern Present (12/06/2023)   [employer] of Occupational Health - Occupational Stress Questionnaire    Feeling of Stress : Only a little  Social Connections: Moderately Integrated (12/06/2023)   Social Connection and Isolation Panel [NHANES]    Frequency of Communication with Friends and Family: Once a week    Frequency of Social Gatherings with Friends and Family: Once a week    Attends Religious Services: More than 4 times per year    Active Member of [employer] or Organizations: Yes    Attends Banker Meetings: More than 4 times per year    Marital Status: Married  Catering manager Violence: Not At Risk (06/23/2023)   Humiliation, Afraid, Rape, and Kick questionnaire    Fear of Current or Ex-Partner: No    Emotionally Abused: No    Physically Abused: No    Sexually Abused: No      Review of Systems  All other systems reviewed and are negative.      Objective:   Physical Exam Vitals reviewed.   Constitutional:      Appearance: She is well-developed. She is obese. She is not diaphoretic.  Cardiovascular:     Rate and Rhythm: Normal rate and regular rhythm.     Heart sounds: Normal heart sounds. No murmur heard. Pulmonary:     Effort: Pulmonary effort is normal. No respiratory distress.     Breath sounds: Normal breath sounds. No wheezing.  Abdominal:     General: Bowel sounds are normal. There is no distension.     Palpations: Abdomen is soft.     Tenderness: There is no abdominal tenderness. There is no guarding or rebound.  Musculoskeletal:     Right lower leg: No edema.     Left lower leg: No edema.  Neurological:     Mental Status: She is alert.     Motor: No abnormal muscle tone.     Deep Tendon Reflexes: Reflexes are normal and symmetric.           Assessment & Plan:  Melena - Plan: Hemoglobin, fingerstick  Epigastric pain - Plan: CBC with Differential/Platelet, COMPLETE METABOLIC PANEL WITHOUT GFR, Lipase I am concerned the patient may have gastritis versus an ulcer versus ectasia causing melena.  Recommended increasing aciphex to 20 mg twice a day, add sucralfate 1 g p.o. q. ACH S.  Add Pepcid 40 mg a day.  If not improving by next week, consult GI for EGD.  Go to the emergency room immediately if she develops lightheadedness, tachycardia, low blood pressure, acute abdominal pain, or hematemesis

## 2024-01-14 LAB — COMPLETE METABOLIC PANEL WITHOUT GFR
AG Ratio: 2.3 (calc) (ref 1.0–2.5)
ALT: 12 U/L (ref 6–29)
AST: 12 U/L (ref 10–35)
Albumin: 4.5 g/dL (ref 3.6–5.1)
Alkaline phosphatase (APISO): 96 U/L (ref 37–153)
BUN: 23 mg/dL (ref 7–25)
CO2: 32 mmol/L (ref 20–32)
Calcium: 9.6 mg/dL (ref 8.6–10.4)
Chloride: 104 mmol/L (ref 98–110)
Creat: 0.77 mg/dL (ref 0.60–1.00)
Globulin: 2 g/dL (ref 1.9–3.7)
Glucose, Bld: 103 mg/dL — ABNORMAL HIGH (ref 65–99)
Potassium: 4.9 mmol/L (ref 3.5–5.3)
Sodium: 141 mmol/L (ref 135–146)
Total Bilirubin: 0.3 mg/dL (ref 0.2–1.2)
Total Protein: 6.5 g/dL (ref 6.1–8.1)

## 2024-01-14 LAB — CBC WITH DIFFERENTIAL/PLATELET
Absolute Lymphocytes: 2341 {cells}/uL (ref 850–3900)
Absolute Monocytes: 680 {cells}/uL (ref 200–950)
Basophils Absolute: 49 {cells}/uL (ref 0–200)
Basophils Relative: 0.6 %
Eosinophils Absolute: 138 {cells}/uL (ref 15–500)
Eosinophils Relative: 1.7 %
HCT: 36.7 % (ref 35.0–45.0)
Hemoglobin: 11.5 g/dL — ABNORMAL LOW (ref 11.7–15.5)
MCH: 26.6 pg — ABNORMAL LOW (ref 27.0–33.0)
MCHC: 31.3 g/dL — ABNORMAL LOW (ref 32.0–36.0)
MCV: 85 fL (ref 80.0–100.0)
MPV: 9.6 fL (ref 7.5–12.5)
Monocytes Relative: 8.4 %
Neutro Abs: 4892 {cells}/uL (ref 1500–7800)
Neutrophils Relative %: 60.4 %
Platelets: 334 10*3/uL (ref 140–400)
RBC: 4.32 10*6/uL (ref 3.80–5.10)
RDW: 17.3 % — ABNORMAL HIGH (ref 11.0–15.0)
Total Lymphocyte: 28.9 %
WBC: 8.1 10*3/uL (ref 3.8–10.8)

## 2024-01-14 LAB — LIPASE: Lipase: 23 U/L (ref 7–60)

## 2024-01-15 ENCOUNTER — Other Ambulatory Visit: Payer: Self-pay | Admitting: Family Medicine

## 2024-01-15 DIAGNOSIS — G4733 Obstructive sleep apnea (adult) (pediatric): Secondary | ICD-10-CM

## 2024-01-16 ENCOUNTER — Encounter: Payer: Self-pay | Admitting: Family Medicine

## 2024-01-16 MED ORDER — MODAFINIL 200 MG PO TABS: ORAL_TABLET | ORAL | 0 refills | Status: DC

## 2024-03-04 ENCOUNTER — Other Ambulatory Visit: Payer: Self-pay | Admitting: Family Medicine

## 2024-03-23 ENCOUNTER — Other Ambulatory Visit: Payer: Self-pay | Admitting: Family Medicine

## 2024-03-23 ENCOUNTER — Encounter: Payer: Self-pay | Admitting: Family Medicine

## 2024-03-23 MED ORDER — DILTIAZEM HCL 30 MG PO TABS: 30.0000 mg | ORAL_TABLET | Freq: Two times a day (BID) | ORAL | 3 refills | Status: AC

## 2024-03-27 ENCOUNTER — Other Ambulatory Visit: Payer: Self-pay | Admitting: Family Medicine

## 2024-03-27 DIAGNOSIS — F324 Major depressive disorder, single episode, in partial remission: Secondary | ICD-10-CM

## 2024-03-27 DIAGNOSIS — F333 Major depressive disorder, recurrent, severe with psychotic symptoms: Secondary | ICD-10-CM

## 2024-04-03 DIAGNOSIS — H33301 Unspecified retinal break, right eye: Secondary | ICD-10-CM | POA: Diagnosis not present

## 2024-04-03 DIAGNOSIS — H2513 Age-related nuclear cataract, bilateral: Secondary | ICD-10-CM | POA: Diagnosis not present

## 2024-04-03 DIAGNOSIS — H524 Presbyopia: Secondary | ICD-10-CM | POA: Diagnosis not present

## 2024-04-03 DIAGNOSIS — H353130 Nonexudative age-related macular degeneration, bilateral, stage unspecified: Secondary | ICD-10-CM | POA: Diagnosis not present

## 2024-04-03 DIAGNOSIS — H5213 Myopia, bilateral: Secondary | ICD-10-CM | POA: Diagnosis not present

## 2024-04-03 DIAGNOSIS — H52223 Regular astigmatism, bilateral: Secondary | ICD-10-CM | POA: Diagnosis not present

## 2024-04-03 DIAGNOSIS — H401421 Capsular glaucoma with pseudoexfoliation of lens, left eye, mild stage: Secondary | ICD-10-CM | POA: Diagnosis not present

## 2024-04-17 ENCOUNTER — Other Ambulatory Visit: Payer: Self-pay

## 2024-04-17 ENCOUNTER — Telehealth: Payer: Self-pay | Admitting: Family Medicine

## 2024-04-17 MED ORDER — RABEPRAZOLE SODIUM 20 MG PO TBEC: 20.0000 mg | DELAYED_RELEASE_TABLET | Freq: Every day | ORAL | 3 refills | Status: DC

## 2024-04-17 NOTE — Telephone Encounter (Signed)
 Prescription Request  04/17/2024  LOV: 01/13/2024  What is the name of the medication or equipment? RABEprazole  (ACIPHEX ) 20 MG tablet   Have you contacted your pharmacy to request a refill? Yes   Which pharmacy would you like this sent to?  [id number] [address], [place] - [address] [place] [address] Phone: [phone] Fax: [phone]    Patient notified that their request is being sent to the clinical staff for review and that they should receive a response within 2 business days.   Please advise at [facility] [phone]

## 2024-04-17 NOTE — Telephone Encounter (Signed)
 Sent in medication

## 2024-04-20 ENCOUNTER — Other Ambulatory Visit: Payer: Self-pay | Admitting: Family Medicine

## 2024-04-20 DIAGNOSIS — I10 Essential (primary) hypertension: Secondary | ICD-10-CM

## 2024-07-05 ENCOUNTER — Encounter: Payer: Self-pay | Admitting: Family Medicine

## 2024-07-05 ENCOUNTER — Other Ambulatory Visit (HOSPITAL_COMMUNITY): Payer: Self-pay

## 2024-07-05 ENCOUNTER — Ambulatory Visit (INDEPENDENT_AMBULATORY_CARE_PROVIDER_SITE_OTHER): Admitting: Family Medicine

## 2024-07-05 ENCOUNTER — Telehealth: Payer: Self-pay | Admitting: Pharmacy Technician

## 2024-07-05 VITALS — BP 136/82 | HR 67 | Temp 99.2°F | Ht 70.0 in | Wt 271.0 lb

## 2024-07-05 DIAGNOSIS — K580 Irritable bowel syndrome with diarrhea: Secondary | ICD-10-CM

## 2024-07-05 DIAGNOSIS — Z23 Encounter for immunization: Secondary | ICD-10-CM | POA: Diagnosis not present

## 2024-07-05 MED ORDER — RIFAXIMIN 550 MG PO TABS: 550.0000 mg | ORAL_TABLET | Freq: Three times a day (TID) | ORAL | 0 refills | Status: AC

## 2024-07-05 NOTE — Progress Notes (Signed)
 Subjective:    Patient ID: Melanie Hurst, female    DOB: [birth year], 73 y.o.   MRN: [id number] patient had a colonoscopy in 2023 that revealed a 7 mm polyp and a 1 mm polyp .  She states that she is having almost daily left lower quadrant abdominal pain.  Diarrhea exacerbates the pain.  She has diarrhea 2 times a week.  However she is going to the bathroom 3 times to 4 times a day anyway.  She denies any nausea or vomiting.  She denies any fevers or chills.  She denies any dysuria or hematuria.  On exam today she is mildly tender to palpation in the left lower quadrant.  She states is like this every day for the last 3 months.  I do not feel that diverticulitis would be persistent for 3 months straight.  I do question whether this is diverticular pain versus irritable bowel Past Medical History:  Diagnosis Date   Anemia    Anxiety    Arthritis    Chronic fatigue syndrome with fibromyalgia    Depression    Family history of adverse reaction to anesthesia    Mother died on OR table   Fibromyalgia    GERD (gastroesophageal reflux disease)    Glaucoma    Hypertension    IBS (irritable bowel syndrome)    Obesity    POTS (postural orthostatic tachycardia syndrome)    takes Diltiazem    Primary hyperparathyroidism    Scoliosis    Sleep apnea    cpap at 7 cm   Tremor, essential    Past Surgical History:  Procedure Laterality Date   BIOPSY  02/18/2023   Procedure: BIOPSY;  Surgeon: Albertus Gordy HERO, MD;  Location: [facility] ENDOSCOPY;  Service: Gastroenterology;;   COLONOSCOPY  10/2021   DILATION AND CURETTAGE OF UTERUS     ENTEROSCOPY N/A 02/18/2023   Procedure: ENTEROSCOPY;  Surgeon: Albertus Gordy HERO, MD;  Location: [facility] ENDOSCOPY;  Service: Gastroenterology;  Laterality: N/A;   ESOPHAGOGASTRODUODENOSCOPY  10/2021   PARATHYROIDECTOMY Left 05/16/2023   Procedure: LEFT INFERIOR PARATHYROIDECTOMY;  Surgeon: Eletha Boas, MD;  Location: [facility]S;  Service: General;  Laterality: Left;  FROZEN SECTION 2ND SCRUB  PERSON   PCOS surgery  1971   wedge resection of both ovaries   TONSILECTOMY, ADENOIDECTOMY, BILATERAL MYRINGOTOMY AND TUBES  1962   Current Outpatient Medications on File Prior to Visit  Medication Sig Dispense Refill   acetaminophen  (TYLENOL ) 500 MG tablet Take 1,000 mg by mouth every 8 (eight) hours as needed for mild pain (pain score 1-3).     ARIPiprazole  (ABILIFY ) 5 MG tablet Take 1 tablet by mouth daily. 90 tablet 2   diltiazem  (CARDIZEM ) 30 MG tablet Take 1 tablet (30 mg total) by mouth 2 (two) times daily. 180 tablet 3   folic acid  (FOLVITE ) 1 MG tablet Take 1 tablet (1 mg total) by mouth daily. 30 tablet 0   losartan  (COZAAR ) 100 MG tablet TAKE 1 TABLET BY MOUTH EVERY DAY 90 tablet 1   modafinil  (PROVIGIL ) 200 MG tablet TAKE 1 TABLET BY MOUTH IN THE MORNING AND IN THE EVENING 180 tablet 0   Multiple Vitamin (MULTIVITAMIN ADULT PO) Take 1 tablet by mouth daily.     RABEprazole  (ACIPHEX ) 20 MG tablet Take 1 tablet (20 mg total) by mouth daily. 30 tablet 3   sertraline  (ZOLOFT ) 100 MG tablet TAKE 1 TABLET BY MOUTH EVERY DAY 90 tablet 3   sucralfate  (CARAFATE ) 1 g tablet Take 1  tablet (1 g total) by mouth 4 (four) times daily -  with meals and at bedtime. 120 tablet 1   Vitamin D -Vitamin K (VITAMIN K2-VITAMIN D3 PO) Take 1 tablet by mouth daily.     No current facility-administered medications on file prior to visit.   Allergies  Allergen Reactions   Asa [Aspirin] Hives   Codeine Nausea And Vomiting   Statins Nausea Only    Malaise    Social History   Socioeconomic History   Marital status: Married    Spouse name: Not on file   Number of children: 0   Years of education: Not on file   Highest education level: Bachelor's degree (e.g., BA, AB, BS)  Occupational History   Occupation: retired  Tobacco Use   Smoking status: Former    Current packs/day: 0.00    Types: Cigarettes    Start date: 10/12/2003    Quit date: 10/11/2009    Years since quitting: 14.7   Smokeless  tobacco: Never  Vaping Use   Vaping status: Former   Devices: vaped to stop smoking  Substance and Sexual Activity   Alcohol use: Yes    Comment: Occasional wine   Drug use: No   Sexual activity: Not Currently  Other Topics Concern   Not on file  Social History Narrative   Not on file   Social Drivers of Health   Financial Resource Strain: Medium Risk (12/06/2023)   Overall Financial Resource Strain (CARDIA)    Difficulty of Paying Living Expenses: Somewhat hard  Food Insecurity: No Food Insecurity (12/06/2023)   Hunger Vital Sign    Worried About Running Out of Food in the Last Year: Never true    Ran Out of Food in the Last Year: Never true  Transportation Needs: No Transportation Needs (12/06/2023)   PRAPARE - Administrator, Civil Service (Medical): No    Lack of Transportation (Non-Medical): No  Physical Activity: Inactive (12/06/2023)   Exercise Vital Sign    Days of Exercise per Week: 0 days    Minutes of Exercise per Session: 0 min  Stress: No Stress Concern Present (12/06/2023)   [employer] of Occupational Health - Occupational Stress Questionnaire    Feeling of Stress : Only a little  Social Connections: Moderately Integrated (12/06/2023)   Social Connection and Isolation Panel    Frequency of Communication with Friends and Family: Once a week    Frequency of Social Gatherings with Friends and Family: Once a week    Attends Religious Services: More than 4 times per year    Active Member of [employer] or Organizations: Yes    Attends Banker Meetings: More than 4 times per year    Marital Status: Married  Catering manager Violence: Not At Risk (06/23/2023)   Humiliation, Afraid, Rape, and Kick questionnaire    Fear of Current or Ex-Partner: No    Emotionally Abused: No    Physically Abused: No    Sexually Abused: No      Review of Systems  All other systems reviewed and are negative.      Objective:   Physical Exam Vitals  reviewed.  Constitutional:      Appearance: She is well-developed. She is obese. She is not diaphoretic.  Cardiovascular:     Rate and Rhythm: Normal rate and regular rhythm.     Heart sounds: Normal heart sounds. No murmur heard. Pulmonary:     Effort: Pulmonary effort is normal. No respiratory distress.  Breath sounds: Normal breath sounds. No wheezing.  Abdominal:     General: Bowel sounds are normal. There is no distension.     Palpations: Abdomen is soft.     Tenderness: There is no abdominal tenderness. There is no guarding or rebound.  Musculoskeletal:     Right lower leg: No edema.     Left lower leg: No edema.  Neurological:     Mental Status: She is alert.     Motor: No abnormal muscle tone.     Deep Tendon Reflexes: Reflexes are normal and symmetric.   Patient is mildly tender to palpation in the left lower quadrant        Assessment & Plan:  Irritable bowel syndrome with diarrhea IBS with diarrhea versus diverticular pain.  I do not feel the patient has diverticulitis as the pain has been constant for 3 months.  Try rifaximin  550 mg 3 times a day for 14 days.  If pain improves this would suggest irritable bowel.  If pain does not improve, consider imaging of the abdomen and pelvis.  She develops fevers, treat the patient for diverticulitis.

## 2024-07-05 NOTE — Telephone Encounter (Signed)
 Pharmacy Patient Advocate Encounter  Received notification from [facility] MEDICARE that Prior Authorization for Xifaxan  550MG  tablets  has been APPROVED from 07/05/24 to 07/19/24. Ran test claim, Copay is $787.11. This test claim was processed through [facility]- copay amounts may vary at other pharmacies due to pharmacy/plan contracts, or as the patient moves through the different stages of their insurance plan.     PA #/Case ID/Reference #: [id number]  *Medicare Prescription Payment Plan program is a program if patient's can't afford their copay upfront then they can call their plan and the plan will split the payment up over a period of time for the patient allowing the patient to be able to get their medication upfront and pay them back over time.*

## 2024-07-05 NOTE — Telephone Encounter (Signed)
 Pharmacy Patient Advocate Encounter   Received notification from [employer] that prior authorization for Xifaxan  550MG  tablets  is required/requested.   Insurance verification completed.   The patient is insured through [facility] .   Per test claim: PA required; PA submitted to above mentioned insurance via Latent Key/confirmation #/EOC [id number] Status is pending

## 2024-07-05 NOTE — Addendum Note (Signed)
 Addended by: ANGELENA RONAL BRADLEY K on: 07/05/2024 12:43 PM   Modules accepted: Orders

## 2024-07-06 ENCOUNTER — Other Ambulatory Visit: Payer: Self-pay

## 2024-07-06 MED ORDER — LINACLOTIDE 72 MCG PO CAPS: 74.0000 ug | ORAL_CAPSULE | Freq: Every day | ORAL | 1 refills | Status: AC

## 2024-07-18 ENCOUNTER — Ambulatory Visit

## 2024-07-18 ENCOUNTER — Telehealth: Payer: Self-pay

## 2024-07-18 VITALS — Ht 70.0 in | Wt 271.0 lb

## 2024-07-18 DIAGNOSIS — Z0000 Encounter for general adult medical examination without abnormal findings: Secondary | ICD-10-CM | POA: Diagnosis not present

## 2024-07-18 NOTE — Patient Instructions (Signed)
 Melanie Hurst,  Thank you for taking the time for your Medicare Wellness Visit. I appreciate your continued commitment to your health goals. Please review the care plan we discussed, and feel free to reach out if I can assist you further.  Medicare recommends these wellness visits once per year to help you and your care team stay ahead of potential health issues. These visits are designed to focus on prevention, allowing your provider to concentrate on managing your acute and chronic conditions during your regular appointments.  Please note that Annual Wellness Visits do not include a physical exam. Some assessments may be limited, especially if the visit was conducted virtually. If needed, we may recommend a separate in-person follow-up with your provider.  Ongoing Care Seeing your primary care provider every 3 to 6 months helps us  monitor your health and provide consistent, personalized care.   Referrals If a referral was made during today's visit and you haven't received any updates within two weeks, please contact the referred provider directly to check on the status.  Recommended Screenings:  Health Maintenance  Topic Date Due   Colon Cancer Screening  11/10/2024   Medicare Annual Wellness Visit  07/18/2025   Breast Cancer Screening  12/05/2025   DTaP/Tdap/Td vaccine (2 - Td or Tdap) 09/01/2030   Pneumococcal Vaccine for age over 15  Completed   Flu Shot  Completed   DEXA scan (bone density measurement)  Completed   COVID-19 Vaccine  Completed   Hepatitis C Screening  Completed   Zoster (Shingles) Vaccine  Completed   Meningitis B Vaccine  Aged Out       07/18/2024   12:56 PM  Advanced Directives  Does Patient Have a Medical Advance Directive? Yes  Type of Estate agent of [place];Living will  Does patient want to make changes to medical advance directive? No - Patient declined  Copy of Healthcare Power of Attorney in Chart? Yes - validated most recent  copy scanned in chart (See row information)   Advance Care Planning is important because it: Ensures you receive medical care that aligns with your values, goals, and preferences. Provides guidance to your family and loved ones, reducing the emotional burden of decision-making during critical moments.  Vision: Annual vision screenings are recommended for early detection of glaucoma, cataracts, and diabetic retinopathy. These exams can also reveal signs of chronic conditions such as diabetes and high blood pressure.  Dental: Annual dental screenings help detect early signs of oral cancer, gum disease, and other conditions linked to overall health, including heart disease and diabetes.  Please see the attached documents for additional preventive care recommendations.

## 2024-07-18 NOTE — Telephone Encounter (Signed)
 Prescription Request  07/18/2024  LOV: 01/13/24  What is the name of the medication or equipment? modafinil  (PROVIGIL ) 200 MG tablet [[id number]]   Have you contacted your pharmacy to request a refill? Yes   Which pharmacy would you like this sent to?  [id number] [address], [place] - [address] [place] [address] Phone: [phone] Fax: [phone]    Patient notified that their request is being sent to the clinical staff for review and that they should receive a response within 2 business days.   Please advise at [facility] [phone]

## 2024-07-18 NOTE — Progress Notes (Signed)
 Subjective:   Melanie Hurst is a 73 y.o. who presents for a Medicare Wellness preventive visit.  As a reminder, Annual Wellness Visits don't include a physical exam, and some assessments may be limited, especially if this visit is performed virtually. We may recommend an in-person follow-up visit with your provider if needed.  Visit Complete: Virtual I connected with  Melanie Hurst on 07/18/24 by a audio enabled telemedicine application and verified that I am speaking with the correct person using two identifiers.  Patient Location: Home  Provider Location: Home Office  I discussed the limitations of evaluation and management by telemedicine. The patient expressed understanding and agreed to proceed.  Vital Signs: Because this visit was a virtual/telehealth visit, some criteria may be missing or patient reported. Any vitals not documented were not able to be obtained and vitals that have been documented are patient reported.  VideoDeclined- This patient declined Librarian, academic. Therefore the visit was completed with audio only.  Persons Participating in Visit: Patient.  AWV Questionnaire: No: Patient Medicare AWV questionnaire was not completed prior to this visit.  Cardiac Risk Factors include: advanced age (>26men, >101 women);dyslipidemia;hypertension     Objective:    Today's Vitals   07/18/24 1200  Weight: 271 lb (122.9 kg)  Height: 5' 10 (1.778 m)   Body mass index is 38.88 kg/m.     07/18/2024   12:56 PM 06/23/2023   11:05 AM 05/16/2023    6:01 AM 04/21/2023    1:03 PM 02/17/2023    5:26 PM 02/17/2023    8:19 AM 07/21/2022    2:02 PM  Advanced Directives  Does Patient Have a Medical Advance Directive? Yes Yes Yes Yes No Yes Yes  Type of Estate agent of [place];Living will Healthcare Power of [place];Living will Living will;Healthcare Power of [employer] Power of [place];Living will  Living will;Healthcare  Power of [employer] Power of [place];Living will  Does patient want to make changes to medical advance directive? No - Patient declined No - Patient declined No - Patient declined  No - Patient declined No - Patient declined No - Patient declined  Copy of Healthcare Power of Attorney in Chart? Yes - validated most recent copy scanned in chart (See row information) Yes - validated most recent copy scanned in chart (See row information) Yes - validated most recent copy scanned in chart (See row information) No - copy requested   No - copy requested  Would patient like information on creating a medical advance directive?   No - Patient declined  No - Patient declined      Current Medications (verified) Outpatient Encounter Medications as of 07/18/2024  Medication Sig   acetaminophen  (TYLENOL ) 500 MG tablet Take 1,000 mg by mouth every 8 (eight) hours as needed for mild pain (pain score 1-3).   ARIPiprazole  (ABILIFY ) 5 MG tablet Take 1 tablet by mouth daily.   diltiazem  (CARDIZEM ) 30 MG tablet Take 1 tablet (30 mg total) by mouth 2 (two) times daily.   folic acid  (FOLVITE ) 1 MG tablet Take 1 tablet (1 mg total) by mouth daily.   linaclotide  (LINZESS ) 72 MCG capsule Take 1 capsule (72 mcg total) by mouth daily.   losartan  (COZAAR ) 100 MG tablet TAKE 1 TABLET BY MOUTH EVERY DAY   modafinil  (PROVIGIL ) 200 MG tablet TAKE 1 TABLET BY MOUTH IN THE MORNING AND IN THE EVENING   Multiple Vitamin (MULTIVITAMIN ADULT PO) Take 1 tablet by mouth daily.  RABEprazole  (ACIPHEX ) 20 MG tablet Take 1 tablet (20 mg total) by mouth daily.   rifaximin  (XIFAXAN ) 550 MG TABS tablet Take 1 tablet (550 mg total) by mouth 3 (three) times daily.   sertraline  (ZOLOFT ) 100 MG tablet TAKE 1 TABLET BY MOUTH EVERY DAY   sucralfate  (CARAFATE ) 1 g tablet Take 1 tablet (1 g total) by mouth 4 (four) times daily -  with meals and at bedtime.   Vitamin D -Vitamin K (VITAMIN K2-VITAMIN D3 PO) Take 1 tablet by mouth daily.   No  facility-administered encounter medications on file as of 07/18/2024.    Allergies (verified) Asa [aspirin], Codeine, and Statins   History: Past Medical History:  Diagnosis Date   Anemia    Anxiety    Arthritis    Chronic fatigue syndrome with fibromyalgia    Depression    Family history of adverse reaction to anesthesia    Mother died on OR table   Fibromyalgia    GERD (gastroesophageal reflux disease)    Glaucoma    Hyperlipidemia    Hypertension    IBS (irritable bowel syndrome)    Obesity    POTS (postural orthostatic tachycardia syndrome)    takes Diltiazem    Primary hyperparathyroidism    Scoliosis    Sleep apnea    cpap at 7 cm   Tremor, essential    Past Surgical History:  Procedure Laterality Date   APPENDECTOMY  1980 0r 81   BIOPSY  02/18/2023   Procedure: BIOPSY;  Surgeon: Albertus Gordy HERO, MD;  Location: [facility] ENDOSCOPY;  Service: Gastroenterology;;   COLONOSCOPY  10/2021   DILATION AND CURETTAGE OF UTERUS     ENTEROSCOPY N/A 02/18/2023   Procedure: ENTEROSCOPY;  Surgeon: Albertus Gordy HERO, MD;  Location: [facility] ENDOSCOPY;  Service: Gastroenterology;  Laterality: N/A;   ESOPHAGOGASTRODUODENOSCOPY  10/2021   PARATHYROIDECTOMY Left 05/16/2023   Procedure: LEFT INFERIOR PARATHYROIDECTOMY;  Surgeon: Eletha Boas, MD;  Location: [facility] ORS;  Service: General;  Laterality: Left;  FROZEN SECTION 2ND SCRUB PERSON   PCOS surgery  1971   wedge resection of both ovaries   TONSILECTOMY, ADENOIDECTOMY, BILATERAL MYRINGOTOMY AND TUBES  1962   Family History  Problem Relation Age of Onset   Hypertension Mother    Melanoma Mother        wrist   Other Mother        brain tumor   Stroke Mother    Hyperlipidemia Mother    Arrhythmia Mother        AFIB   Cancer Father    Hyperlipidemia Father    Hypertension Father    Colon polyps Father        not cancerous   Kidney failure Father    Arrhythmia Father        AFIB   Breast cancer Sister    Arrhythmia Sister        AFIB    Breast cancer Maternal Aunt    Stomach cancer Maternal Uncle    Breast cancer Paternal Aunt    Colon cancer Paternal Aunt    Irritable bowel syndrome Paternal Aunt    Cancer Paternal Aunt    Depression Paternal Aunt    Esophageal cancer Neg Hx    Rectal cancer Neg Hx    Social History   Socioeconomic History   Marital status: Married    Spouse name: Not on file   Number of children: 0   Years of education: Not on file   Highest education level: Bachelor's  degree (e.g., BA, AB, BS)  Occupational History   Occupation: retired  Tobacco Use   Smoking status: Former    Current packs/day: 0.00    Types: Cigarettes    Start date: 10/12/2003    Quit date: 10/11/2009    Years since quitting: 14.7   Smokeless tobacco: Never  Vaping Use   Vaping status: Former   Devices: vaped to stop smoking  Substance and Sexual Activity   Alcohol use: Yes    Comment: Occasional wine   Drug use: No   Sexual activity: Not Currently  Other Topics Concern   Not on file  Social History Narrative   Not on file   Social Drivers of Health   Financial Resource Strain: Low Risk  (07/18/2024)   Overall Financial Resource Strain (CARDIA)    Difficulty of Paying Living Expenses: Not very hard  Food Insecurity: No Food Insecurity (07/18/2024)   Hunger Vital Sign    Worried About Running Out of Food in the Last Year: Never true    Ran Out of Food in the Last Year: Never true  Transportation Needs: No Transportation Needs (07/18/2024)   PRAPARE - Administrator, Civil Service (Medical): No    Lack of Transportation (Non-Medical): No  Physical Activity: Inactive (07/18/2024)   Exercise Vital Sign    Days of Exercise per Week: 0 days    Minutes of Exercise per Session: 0 min  Stress: No Stress Concern Present (07/18/2024)   [employer] of Occupational Health - Occupational Stress Questionnaire    Feeling of Stress: Only a little  Social Connections: Moderately Integrated (07/18/2024)    Social Connection and Isolation Panel    Frequency of Communication with Friends and Family: Once a week    Frequency of Social Gatherings with Friends and Family: Once a week    Attends Religious Services: More than 4 times per year    Active Member of [employer] or Organizations: Yes    Attends Engineer, structural: More than 4 times per year    Marital Status: Married    Tobacco Counseling Counseling given: Not Answered    Clinical Intake:  Pre-visit preparation completed: Yes  Pain : No/denies pain  Diabetes: No   How often do you need to have someone help you when you read instructions, pamphlets, or other written materials from your doctor or pharmacy?: 1 - Never  Interpreter Needed?: No  Information entered by :: Charmaine Bloodgood LPN   Activities of Daily Living     07/18/2024   12:56 PM  In your present state of health, do you have any difficulty performing the following activities:  Hearing? 0  Vision? 0  Difficulty concentrating or making decisions? 0  Walking or climbing stairs? 0  Dressing or bathing? 0  Doing errands, shopping? 0  Preparing Food and eating ? N  Using the Toilet? N  In the past six months, have you accidently leaked urine? N  Do you have problems with loss of bowel control? N  Managing your Medications? N  Managing your Finances? N  Housekeeping or managing your Housekeeping? N    Patient Care Team: Duanne Butler DASEN, MD as PCP - General (Family Medicine) Hobart Powell BRAVO, MD (Inactive) as PCP - Cardiology (Cardiology) Coralee Locus, [place] (Optometry) Fernande Elspeth BROCKS, MD as Consulting Physician (Cardiology) Ablott, Locus, OD (Optometry)  I have updated your Care Teams any recent Medical Services you may have received from other providers in the past year.  Assessment:   This is a routine wellness examination for Melanie Hurst.  Hearing/Vision screen Hearing Screening - Comments:: Patient is able to hear conversational tones  without difficulty. No issues reported.   Vision Screening - Comments:: up to date with routine eye exams with Dr. Coralee     Goals Addressed             This Visit's Progress    Remain active and independent   On track      Depression Screen     07/18/2024   12:01 PM 07/05/2024   11:49 AM 12/08/2023   12:09 PM 06/23/2023   11:03 AM 06/17/2022   10:34 AM 11/04/2020    2:18 PM 08/07/2020   12:08 PM  PHQ 2/9 Scores  PHQ - 2 Score 2  2 2 2  0 4  PHQ- 9 Score 9  9 6 7  10   Exception Documentation  Patient refusal         Fall Risk     07/18/2024   12:55 PM 07/05/2024   11:49 AM 01/13/2024    4:17 PM 12/08/2023   12:08 PM 06/23/2023   11:04 AM  Fall Risk   Falls in the past year? 0 0 0 1 0  Number falls in past yr: 0 0 0 0 0  Injury with Fall? 0 0 0 0 0  Risk for fall due to : No Fall Risks No Fall Risks No Fall Risks History of fall(s);Impaired balance/gait;Impaired mobility No Fall Risks  Follow up Falls prevention discussed;Education provided;Falls evaluation completed Falls evaluation completed Falls prevention discussed;Falls evaluation completed Falls prevention discussed;Falls evaluation completed Falls prevention discussed;Education provided;Falls evaluation completed    MEDICARE RISK AT HOME:  Medicare Risk at Home Any stairs in or around the home?: No If so, are there any without handrails?: No Home free of loose throw rugs in walkways, pet beds, electrical cords, etc?: Yes Adequate lighting in your home to reduce risk of falls?: Yes Life alert?: No Use of a cane, walker or w/c?: Yes Grab bars in the bathroom?: Yes Shower chair or bench in shower?: No Elevated toilet seat or a handicapped toilet?: Yes  TIMED UP AND GO:  Was the test performed?  No  Cognitive Function: 6CIT completed        07/18/2024   12:56 PM 06/23/2023   11:05 AM  6CIT Screen  What Year? 0 points 0 points  What month? 0 points 0 points  What time? 0 points 0 points  Count back from 20  0 points 0 points  Months in reverse 0 points 0 points  Repeat phrase 0 points 0 points  Total Score 0 points 0 points    Immunizations Immunization History  Administered Date(s) Administered   Fluad Quad(high Dose 65+) 06/05/2019, 07/10/2020   INFLUENZA, HIGH DOSE SEASONAL PF 08/08/2017, 07/27/2018, 07/04/2023, 07/05/2024   Influenza Whole 06/11/2010   Influenza,inj,Quad PF,6+ Mos 06/11/2016   Influenza-Unspecified 06/11/2010, 06/26/2022   PFIZER Comirnaty(Gray Top)Covid-19 Tri-Sucrose Vaccine 04/20/2021   PFIZER(Purple Top)SARS-COV-2 Vaccination 11/17/2019, 12/12/2019, 07/14/2020   PNEUMOCOCCAL CONJUGATE-20 04/24/2022   Pfizer Covid-19 Vaccine Bivalent Booster 45yrs & up 06/30/2021, 04/24/2022   Pfizer(Comirnaty)Fall Seasonal Vaccine 12 years and older 07/05/2022, 01/03/2023, 07/04/2023, 12/12/2023   Respiratory Syncytial Virus Vaccine,Recomb Aduvanted(Arexvy) 06/15/2022   Tdap 09/01/2020   Zoster Recombinant(Shingrix) 02/07/2018, 04/10/2018    Screening Tests Health Maintenance  Topic Date Due   Colonoscopy  11/10/2024   Medicare Annual Wellness (AWV)  07/18/2025   Mammogram  12/05/2025  DTaP/Tdap/Td (2 - Td or Tdap) 09/01/2030   Pneumococcal Vaccine: 50+ Years  Completed   Influenza Vaccine  Completed   DEXA SCAN  Completed   COVID-19 Vaccine  Completed   Hepatitis C Screening  Completed   Zoster Vaccines- Shingrix  Completed   Meningococcal B Vaccine  Aged Out    Health Maintenance Items Addressed: Patient is up to date   Additional Screening:  Vision Screening: Recommended annual ophthalmology exams for early detection of glaucoma and other disorders of the eye. Is the patient up to date with their annual eye exam?  Yes  Who is the provider or what is the name of the office in which the patient attends annual eye exams? Dr. Coralee   Dental Screening: Recommended annual dental exams for proper oral hygiene  Community Resource Referral / Chronic Care  Management: CRR required this visit?  No   CCM required this visit?  No   Plan:    I have personally reviewed and noted the following in the patient's chart:   Medical and social history Use of alcohol, tobacco or illicit drugs  Current medications and supplements including opioid prescriptions. Patient is not currently taking opioid prescriptions. Functional ability and status Nutritional status Physical activity Advanced directives List of other physicians Hospitalizations, surgeries, and ER visits in previous 12 months Vitals Screenings to include cognitive, depression, and falls Referrals and appointments  In addition, I have reviewed and discussed with patient certain preventive protocols, quality metrics, and best practice recommendations. A written personalized care plan for preventive services as well as general preventive health recommendations were provided to patient.   Lavelle Pfeiffer [place], [place]   89/10/7972   After Visit Summary: (MyChart) Due to this being a telephonic visit, the after visit summary with patients personalized plan was offered to patient via MyChart   Notes: Nothing significant to report at this time.

## 2024-07-19 ENCOUNTER — Other Ambulatory Visit: Payer: Self-pay | Admitting: Family Medicine

## 2024-07-19 DIAGNOSIS — G4733 Obstructive sleep apnea (adult) (pediatric): Secondary | ICD-10-CM

## 2024-07-19 MED ORDER — MODAFINIL 200 MG PO TABS: ORAL_TABLET | ORAL | 0 refills | Status: DC

## 2024-08-09 ENCOUNTER — Other Ambulatory Visit: Payer: Self-pay | Admitting: Family Medicine

## 2024-08-15 ENCOUNTER — Other Ambulatory Visit: Payer: Self-pay | Admitting: Medical Genetics

## 2024-08-15 DIAGNOSIS — Z006 Encounter for examination for normal comparison and control in clinical research program: Secondary | ICD-10-CM

## 2024-08-21 ENCOUNTER — Telehealth: Admitting: Physician Assistant

## 2024-08-21 DIAGNOSIS — R32 Unspecified urinary incontinence: Secondary | ICD-10-CM

## 2024-08-21 DIAGNOSIS — R350 Frequency of micturition: Secondary | ICD-10-CM

## 2024-08-21 NOTE — Progress Notes (Signed)

## 2024-08-30 ENCOUNTER — Encounter: Payer: Self-pay | Admitting: Family Medicine

## 2024-08-30 ENCOUNTER — Ambulatory Visit: Admitting: Family Medicine

## 2024-08-30 VITALS — BP 136/76 | HR 73 | Temp 99.3°F | Ht 70.0 in | Wt 275.6 lb

## 2024-08-30 DIAGNOSIS — R32 Unspecified urinary incontinence: Secondary | ICD-10-CM | POA: Diagnosis not present

## 2024-08-30 MED ORDER — MIRABEGRON ER 25 MG PO TB24: 25.0000 mg | ORAL_TABLET | Freq: Every day | ORAL | 3 refills | Status: AC

## 2024-08-30 NOTE — Progress Notes (Signed)
 Subjective:    Patient ID: Melanie Hurst, female    DOB: January 08, 1951, 73 y.o.   MRN: [id number]  Patient reports urinary incontinence this week gradually getting worse over the last 2 months.  She states that she will be sitting and reading and suddenly have the urge to urinate and as soon as she stands up she experiences incontinence.  She states that she is going frequently to the bathroom throughout the day.  She frequently feels the urge to urinate without warning.  She denies any dysuria or hematuria.  She denies any weight loss or polydipsia or blurry vision.  She also reports stress incontinence.  She states that occasionally when she coughs and sneeze, she will have urinary incontinence.  She also reports if she stands up quickly she will have urinary incontinence.  We discussed a cystocele potentially causing urinary incontinence.  She denies feeling any kind of mass inside the vagina.  She prefers not to have a pelvic exam today.  She would like to try medication for overactive bladder Past Medical History:  Diagnosis Date   Anemia    Anxiety    Arthritis    Chronic fatigue syndrome with fibromyalgia    Depression    Family history of adverse reaction to anesthesia    Mother died on OR table   Fibromyalgia    GERD (gastroesophageal reflux disease)    Glaucoma    Hyperlipidemia    Hypertension    IBS (irritable bowel syndrome)    Obesity    POTS (postural orthostatic tachycardia syndrome)    takes Diltiazem    Primary hyperparathyroidism    Scoliosis    Sleep apnea    cpap at 7 cm   Tremor, essential    Past Surgical History:  Procedure Laterality Date   APPENDECTOMY  1980 0r 81   BIOPSY  02/18/2023   Procedure: BIOPSY;  Surgeon: Albertus Gordy HERO, MD;  Location: [facility] ENDOSCOPY;  Service: Gastroenterology;;   COLONOSCOPY  10/2021   DILATION AND CURETTAGE OF UTERUS     ENTEROSCOPY N/A 02/18/2023   Procedure: ENTEROSCOPY;  Surgeon: Albertus Gordy HERO, MD;  Location: [facility] ENDOSCOPY;   Service: Gastroenterology;  Laterality: N/A;   ESOPHAGOGASTRODUODENOSCOPY  10/2021   PARATHYROIDECTOMY Left 05/16/2023   Procedure: LEFT INFERIOR PARATHYROIDECTOMY;  Surgeon: Eletha Boas, MD;  Location: [facility] ORS;  Service: General;  Laterality: Left;  FROZEN SECTION 2ND SCRUB PERSON   PCOS surgery  1971   wedge resection of both ovaries   TONSILECTOMY, ADENOIDECTOMY, BILATERAL MYRINGOTOMY AND TUBES  1962   Current Outpatient Medications on File Prior to Visit  Medication Sig Dispense Refill   acetaminophen  (TYLENOL ) 500 MG tablet Take 1,000 mg by mouth every 8 (eight) hours as needed for mild pain (pain score 1-3).     ARIPiprazole  (ABILIFY ) 5 MG tablet Take 1 tablet by mouth daily. 90 tablet 2   diltiazem  (CARDIZEM ) 30 MG tablet Take 1 tablet (30 mg total) by mouth 2 (two) times daily. 180 tablet 3   folic acid  (FOLVITE ) 1 MG tablet Take 1 tablet (1 mg total) by mouth daily. 30 tablet 0   linaclotide  (LINZESS ) 72 MCG capsule Take 1 capsule (72 mcg total) by mouth daily. 30 capsule 1   losartan  (COZAAR ) 100 MG tablet TAKE 1 TABLET BY MOUTH EVERY DAY 90 tablet 1   modafinil  (PROVIGIL ) 200 MG tablet TAKE 1 TABLET BY MOUTH IN THE MORNING AND IN THE EVENING 180 tablet 0   Multiple Vitamin (MULTIVITAMIN ADULT PO)  Take 1 tablet by mouth daily.     RABEprazole  (ACIPHEX ) 20 MG tablet TAKE 1 TABLET BY MOUTH EVERY DAY 90 tablet 2   rifaximin  (XIFAXAN ) 550 MG TABS tablet Take 1 tablet (550 mg total) by mouth 3 (three) times daily. 42 tablet 0   sertraline  (ZOLOFT ) 100 MG tablet TAKE 1 TABLET BY MOUTH EVERY DAY 90 tablet 3   sucralfate  (CARAFATE ) 1 g tablet Take 1 tablet (1 g total) by mouth 4 (four) times daily -  with meals and at bedtime. 120 tablet 1   Vitamin D -Vitamin K (VITAMIN K2-VITAMIN D3 PO) Take 1 tablet by mouth daily.     No current facility-administered medications on file prior to visit.   Allergies  Allergen Reactions   Asa [Aspirin] Hives   Codeine Nausea And Vomiting   Statins  Nausea Only    Malaise    Social History   Socioeconomic History   Marital status: Married    Spouse name: Not on file   Number of children: 0   Years of education: Not on file   Highest education level: Bachelor's degree (e.g., BA, AB, BS)  Occupational History   Occupation: retired  Tobacco Use   Smoking status: Former    Current packs/day: 0.00    Types: Cigarettes    Start date: 10/12/2003    Quit date: 10/11/2009    Years since quitting: 14.8   Smokeless tobacco: Never  Vaping Use   Vaping status: Former   Devices: vaped to stop smoking  Substance and Sexual Activity   Alcohol use: Yes    Comment: Occasional wine   Drug use: No   Sexual activity: Not Currently  Other Topics Concern   Not on file  Social History Narrative   Not on file   Social Drivers of Health   Financial Resource Strain: Low Risk  (08/24/2024)   Overall Financial Resource Strain (CARDIA)    Difficulty of Paying Living Expenses: Not very hard  Food Insecurity: No Food Insecurity (08/24/2024)   Hunger Vital Sign    Worried About Running Out of Food in the Last Year: Never true    Ran Out of Food in the Last Year: Never true  Transportation Needs: No Transportation Needs (08/24/2024)   PRAPARE - Administrator, Civil Service (Medical): No    Lack of Transportation (Non-Medical): No  Physical Activity: Inactive (08/24/2024)   Exercise Vital Sign    Days of Exercise per Week: 0 days    Minutes of Exercise per Session: Not on file  Stress: No Stress Concern Present (08/24/2024)   [employer] of Occupational Health - Occupational Stress Questionnaire    Feeling of Stress: Not at all  Social Connections: Moderately Integrated (08/24/2024)   Social Connection and Isolation Panel    Frequency of Communication with Friends and Family: Never    Frequency of Social Gatherings with Friends and Family: Once a week    Attends Religious Services: More than 4 times per year    Active  Member of [employer] or Organizations: Yes    Attends Banker Meetings: More than 4 times per year    Marital Status: Married  Catering Manager Violence: Not At Risk (07/18/2024)   Humiliation, Afraid, Rape, and Kick questionnaire    Fear of Current or Ex-Partner: No    Emotionally Abused: No    Physically Abused: No    Sexually Abused: No      Review of Systems  All other  systems reviewed and are negative.      Objective:   Physical Exam Vitals reviewed.  Constitutional:      Appearance: She is well-developed. She is obese. She is not diaphoretic.  Cardiovascular:     Rate and Rhythm: Normal rate and regular rhythm.     Heart sounds: Normal heart sounds. No murmur heard. Pulmonary:     Effort: Pulmonary effort is normal. No respiratory distress.     Breath sounds: Normal breath sounds. No wheezing.  Abdominal:     General: Bowel sounds are normal. There is no distension.     Palpations: Abdomen is soft.     Tenderness: There is no abdominal tenderness. There is no guarding or rebound.  Musculoskeletal:     Right lower leg: No edema.     Left lower leg: No edema.  Neurological:     Mental Status: She is alert.     Motor: No abnormal muscle tone.     Deep Tendon Reflexes: Reflexes are normal and symmetric.   \        Assessment & Plan:  Urinary incontinence, unspecified type - Plan: Urinalysis, Routine w reflex microscopic Patient has a mixed picture of stress incontinence versus urge incontinence.  Will try Myrbetriq 25 mg daily.  If the patient does not experience any improvement I would recommend returning for a pelvic exam to rule out a cystocele.  We also discussed medication such as Vesicare but the patient would like to avoid this due to potential neurologic side effects

## 2024-09-03 LAB — GENECONNECT MOLECULAR SCREEN: Genetic Analysis Overall Interpretation: NEGATIVE

## 2024-09-04 ENCOUNTER — Other Ambulatory Visit: Payer: Self-pay

## 2024-09-17 ENCOUNTER — Other Ambulatory Visit: Payer: Self-pay

## 2024-09-17 ENCOUNTER — Telehealth: Payer: Self-pay

## 2024-09-17 DIAGNOSIS — I10 Essential (primary) hypertension: Secondary | ICD-10-CM

## 2024-09-17 MED ORDER — LOSARTAN POTASSIUM 100 MG PO TABS: 100.0000 mg | ORAL_TABLET | Freq: Every day | ORAL | 1 refills | Status: AC

## 2024-09-17 NOTE — Telephone Encounter (Signed)
 Sent in medication

## 2024-09-17 NOTE — Telephone Encounter (Signed)
 Prescription Request  09/17/2024  LOV: 08/30/24  What is the name of the medication or equipment? losartan  (COZAAR ) 100 MG tablet [[id number]]   Have you contacted your pharmacy to request a refill? Yes   Which pharmacy would you like this sent to?  [id number] [address], [place] - [address] [place] [address] Phone: [phone] Fax: [phone]    Patient notified that their request is being sent to the clinical staff for review and that they should receive a response within 2 business days.   Please advise at [facility] [phone]

## 2024-10-15 ENCOUNTER — Other Ambulatory Visit: Payer: Self-pay | Admitting: Family Medicine

## 2024-10-15 DIAGNOSIS — G4733 Obstructive sleep apnea (adult) (pediatric): Secondary | ICD-10-CM

## 2025-07-24 ENCOUNTER — Ambulatory Visit
